# Patient Record
Sex: Female | Born: 1965 | Race: Black or African American | Hispanic: No | Marital: Single | Smoking: Never smoker
Health system: Southern US, Community
[De-identification: ages and names within clinical notes are randomized; demographics above are authoritative.]

## PROBLEM LIST (undated history)

## (undated) DIAGNOSIS — M2550 Pain in unspecified joint: Secondary | ICD-10-CM

## (undated) DIAGNOSIS — E559 Vitamin D deficiency, unspecified: Secondary | ICD-10-CM

## (undated) DIAGNOSIS — M549 Dorsalgia, unspecified: Secondary | ICD-10-CM

## (undated) DIAGNOSIS — I10 Essential (primary) hypertension: Secondary | ICD-10-CM

## (undated) DIAGNOSIS — E669 Obesity, unspecified: Secondary | ICD-10-CM

## (undated) DIAGNOSIS — C541 Malignant neoplasm of endometrium: Secondary | ICD-10-CM

## (undated) DIAGNOSIS — R600 Localized edema: Secondary | ICD-10-CM

## (undated) DIAGNOSIS — M1990 Unspecified osteoarthritis, unspecified site: Secondary | ICD-10-CM

## (undated) HISTORY — DX: Localized edema: R60.0

## (undated) HISTORY — PX: ABDOMINAL HYSTERECTOMY: SHX81

## (undated) HISTORY — DX: Pain in unspecified joint: M25.50

## (undated) HISTORY — DX: Vitamin D deficiency, unspecified: E55.9

## (undated) HISTORY — DX: Obesity, unspecified: E66.9

## (undated) HISTORY — DX: Essential (primary) hypertension: I10

## (undated) HISTORY — DX: Malignant neoplasm of endometrium: C54.1

## (undated) HISTORY — DX: Dorsalgia, unspecified: M54.9

## (undated) HISTORY — PX: REDUCTION MAMMAPLASTY: SUR839

---

## 1998-02-02 ENCOUNTER — Encounter: Admission: RE | Admit: 1998-02-02 | Discharge: 1998-05-03 | Payer: Self-pay | Admitting: *Deleted

## 2000-06-23 ENCOUNTER — Other Ambulatory Visit: Admission: RE | Admit: 2000-06-23 | Discharge: 2000-06-23 | Payer: Self-pay | Admitting: Family Medicine

## 2001-07-05 ENCOUNTER — Other Ambulatory Visit: Admission: RE | Admit: 2001-07-05 | Discharge: 2001-07-05 | Payer: Self-pay | Admitting: Family Medicine

## 2001-07-08 ENCOUNTER — Encounter: Payer: Self-pay | Admitting: Family Medicine

## 2001-07-08 ENCOUNTER — Encounter: Admission: RE | Admit: 2001-07-08 | Discharge: 2001-07-08 | Payer: Self-pay | Admitting: Family Medicine

## 2003-04-10 ENCOUNTER — Other Ambulatory Visit: Admission: RE | Admit: 2003-04-10 | Discharge: 2003-04-10 | Payer: Self-pay | Admitting: Family Medicine

## 2004-11-21 ENCOUNTER — Other Ambulatory Visit: Admission: RE | Admit: 2004-11-21 | Discharge: 2004-11-21 | Payer: Self-pay | Admitting: Family Medicine

## 2006-04-29 ENCOUNTER — Encounter: Admission: RE | Admit: 2006-04-29 | Discharge: 2006-04-29 | Payer: Self-pay | Admitting: Family Medicine

## 2006-05-03 ENCOUNTER — Encounter: Admission: RE | Admit: 2006-05-03 | Discharge: 2006-05-03 | Payer: Self-pay | Admitting: Family Medicine

## 2006-06-29 DIAGNOSIS — C541 Malignant neoplasm of endometrium: Secondary | ICD-10-CM

## 2006-06-29 HISTORY — DX: Malignant neoplasm of endometrium: C54.1

## 2006-08-03 ENCOUNTER — Ambulatory Visit (HOSPITAL_COMMUNITY): Admission: RE | Admit: 2006-08-03 | Discharge: 2006-08-03 | Payer: Self-pay | Admitting: Obstetrics and Gynecology

## 2006-08-03 ENCOUNTER — Encounter (INDEPENDENT_AMBULATORY_CARE_PROVIDER_SITE_OTHER): Payer: Self-pay | Admitting: *Deleted

## 2007-09-16 ENCOUNTER — Encounter: Admission: RE | Admit: 2007-09-16 | Discharge: 2007-09-16 | Payer: Self-pay | Admitting: Family Medicine

## 2009-04-17 HISTORY — PX: LAPAROSCOPIC GASTRIC BANDING: SHX1100

## 2010-10-20 ENCOUNTER — Encounter: Payer: Self-pay | Admitting: Family Medicine

## 2011-02-14 NOTE — Op Note (Signed)
[name], [name]               ACCOUNT NO.:  [id number]   MEDICAL RECORD NO.:  [id number]          PATIENT TYPE:  AMB   LOCATION:  SDC                           FACILITY:  [facility]   PHYSICIAN:  Dois Davenport A. Rivard, M.D. DATE OF BIRTH:  05-Mar-1966   DATE OF PROCEDURE:  08/03/2006  DATE OF DISCHARGE:                                 OPERATIVE REPORT   PREOPERATIVE DIAGNOSIS:  Metrorrhagia.   POSTOPERATIVE DIAGNOSIS:  Metrorrhagia.   ANESTHESIA:  General.   PROCEDURE:  Hysteroscopy and D&C.   SURGEON:  Crist Fat. Rivard, M.D.   ESTIMATED BLOOD LOSS:  Minimal.   PROCEDURE:  After being informed of the planned procedure with possible  complications including bleeding, infection and injury to uterus, informed  consent is obtained.  The patient is taken to OR #7 and given general  anesthesia with endotracheal intubation without complication.  She is placed  in the lithotomy position, prepped and draped in a sterile fashion and her  bladder is emptied with an in-and-out Foley catheter.  A weighted speculum  is inserted, anterior lip of the cervix was grasped with a tenaculum forceps  and we proceed with a paracervical block using Nesacaine 1% 10 mL in the  usual fashion to help in postop pain management.  The uterus is then sounded  at 12 cm which is much larger than expected.  The cervix easily accepts  Hegar dilator #25 thanks to preop vaginal Cytotec.  We proceed with  completing cervical dilatation with Hegar dilator until 31 and we enter a  operative hysteroscope.   With perfusion of sorbitol 3% at a maximum pressure of 90 mmHg.  We attempt  to visualize the endometrial cavity which was very difficult due to the  ongoing bleeding from the patient.  We then removed hysteroscope and proceed  with a sharp curettage of the endometrial cavity which removes a large  amount of endometrium that appears partially necrotic.  After a lengthy  sharp curettage, the hysteroscope is reinserted  again at a maximum pressure  of 90 mmHg and then we can visualize the fundal area of the uterus which  appears to be covered with overgrown endometrium with a papillary pattern  and abnormal vessels.  This is highly suspicious for adenocarcinoma that  could also be complex hyperplasia with atypia.  The hysteroscope was removed  and we proceed again with curettage which removes a very large quantity of  the same material previously described.  We feel for Telfa sponges of this  material and then reinsert the hysteroscope to visualize the endometrial  cavity.  Bleeding has stopped.  Most of the cavity is normal except of that  left fundal area which still contained some tissue.  There is no active  bleeding.  No polyps were identified, but again the visualization of the  cavity was greatly limited by the ongoing bleeding.  The tubal ostia were  never visualized because of this overgrown endometrium overlying both areas.  We removed the instruments, removed the tenaculum.  There is no active  bleeding on the cervix but there is a  small laceration of the perineum which  is closed with a figure-of-eight stitch of 3-0 Monocryl.  Instruments and  sponge count is complete x2.  Estimated blood loss is minimal.  The  procedure was well tolerated by the patient who is taken to recovery room in  a well and stable condition.   We will await final pathology report for future management of this patient.  The patient is discharged home with pain management with ibuprofen 600 mg  and Vicodin.      Crist Fat Rivard, M.D.  Electronically Signed     SAR/MEDQ  D:  08/03/2006  T:  08/03/2006  Job:  [id number]   cc:   Talmadge Coventry, M.D.  Fax: [phone]

## 2011-07-25 ENCOUNTER — Other Ambulatory Visit: Payer: Self-pay | Admitting: Surgical Oncology

## 2011-07-25 ENCOUNTER — Other Ambulatory Visit: Payer: Self-pay | Admitting: Family Medicine

## 2011-07-25 ENCOUNTER — Other Ambulatory Visit: Payer: Self-pay | Admitting: *Deleted

## 2011-07-25 DIAGNOSIS — Z1231 Encounter for screening mammogram for malignant neoplasm of breast: Secondary | ICD-10-CM

## 2011-07-28 ENCOUNTER — Ambulatory Visit (HOSPITAL_BASED_OUTPATIENT_CLINIC_OR_DEPARTMENT_OTHER)
Admission: RE | Admit: 2011-07-28 | Discharge: 2011-07-28 | Disposition: A | Discharge status: Home / Self Care | Payer: BC Managed Care – PPO | Source: Ambulatory Visit | Attending: Diagnostic Radiology | Admitting: Diagnostic Radiology

## 2011-07-28 ENCOUNTER — Other Ambulatory Visit: Payer: Self-pay | Admitting: Internal Medicine

## 2011-07-28 DIAGNOSIS — Z1231 Encounter for screening mammogram for malignant neoplasm of breast: Secondary | ICD-10-CM

## 2011-08-14 ENCOUNTER — Ambulatory Visit (INDEPENDENT_AMBULATORY_CARE_PROVIDER_SITE_OTHER): Payer: BC Managed Care – PPO | Admitting: Internal Medicine

## 2011-08-14 ENCOUNTER — Ambulatory Visit: Payer: BC Managed Care – PPO | Admitting: Internal Medicine

## 2011-08-14 ENCOUNTER — Encounter: Payer: Self-pay | Admitting: Internal Medicine

## 2011-08-14 DIAGNOSIS — I10 Essential (primary) hypertension: Secondary | ICD-10-CM

## 2011-08-14 DIAGNOSIS — C541 Malignant neoplasm of endometrium: Secondary | ICD-10-CM

## 2011-08-14 DIAGNOSIS — E669 Obesity, unspecified: Secondary | ICD-10-CM

## 2011-08-14 DIAGNOSIS — C549 Malignant neoplasm of corpus uteri, unspecified: Secondary | ICD-10-CM

## 2011-08-14 NOTE — Progress Notes (Signed)
  Subjective:    Patient ID: [name], female    DOB: [birth year], 45 y.o.   MRN: [id number]  HPI New pt here for first visit.  Former pt of Dr. Raquel James.  She currently receives medical care from Occ MD at [facility]  Dr. Christene Slates.  PMH  Obesity S/P lap band in 2010, HTN, and [place] CA of endometrium S/P  Hysterectomy and Bil S/O in 2008.  Overall doing well but frustrated with plateau in weight after lap band.  Total weight loss approx 50 lbs.  She reports she does not take vitamin regularly.  She does see Dr. Lily Peer q 2-3 months.  She is interested ina second opinion about weight control issues now  She assures me shehas had all recent blood work from [place] but I do not have copies.  She tells me they did check all vitamin levels there.  I asked her to bring me copies of labs as she does not want labs done here today  No Known Allergies Past Medical History  Diagnosis Date  . 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 Hypertension   . Obesity     S/P lap band 2010   Past Surgical History  Procedure Date  . Abdominal hysterectomy     s/p endometrial adenocarcinoma  . Laparoscopic gastric banding 04/17/2009   History   Social History  . Marital Status: Single    Spouse Name: N/A    Number of Children: N/A  . Years of Education: N/A   Occupational History  . Not on file.   Social History Main Topics  . Smoking status: Never Smoker   . Smokeless tobacco: Never Used  . Alcohol Use: No  . Drug Use: No  . Sexually Active: No   Other Topics Concern  . Not on file   Social History Narrative  . No narrative on file   Family History  Problem Relation Age of Onset  . Hypertension Mother   . Kidney disease Mother   . Hypertension Sister   . Hypertension Maternal Grandmother    Patient Active Problem List  Diagnoses  . Hypertension  . Endometrial adenocarcinoma  . Obesity   No current outpatient prescriptions on file prior to visit.        Review of  Systems See HPI    Objective:   Physical Exam Physical Exam  Nursing note and vitals reviewed.  Constitutional: She is oriented to person, place, and time. She appears well-developed and well-nourished.  HENT:  Head: Normocephalic and atraumatic.  Cardiovascular: Normal rate and regular rhythm. Exam reveals no gallop and no friction rub.  No murmur heard.  Pulmonary/Chest: Breath sounds normal. She has no wheezes. She has no rales.  Neurological: She is alert and oriented to person, place, and time.  Skin: Skin is warm and dry.  Psychiatric: She has a normal mood and affect. Her behavior is normal. Ext:  No edema       Assessment & Plan:  1)  Obesity S/P lap band  Advised she must take MVI daily.  She declines testing of vitamin levbels or labs today.  Will refer to Dr. Kinnie Scales for second opinion  ?? Addition of meds vs diet guidance 2)  HTN  Well controlled on triamterene/hctz 3)  AdenoCA of endometrium:  She has upcoming appt . With gyn onc at [facility] 4)   aDvised pt to give consent for old records

## 2011-08-14 NOTE — Patient Instructions (Signed)
Will set up appt with Dr. Lorelee Cover consent for old records  Return prn

## 2011-10-16 ENCOUNTER — Telehealth: Payer: Self-pay | Admitting: Internal Medicine

## 2011-10-16 NOTE — Telephone Encounter (Signed)
Gavin Pound would you call Dr. Mancel Bale office as we referred this pt. To him.  If she went would you get office note  Message back if she went or not

## 2011-10-21 ENCOUNTER — Encounter: Payer: Self-pay | Admitting: Internal Medicine

## 2011-10-21 NOTE — Telephone Encounter (Signed)
Per Dr. Jennye Boroughs office, she did not keep her appointment.

## 2011-10-24 ENCOUNTER — Encounter: Payer: Self-pay | Admitting: Internal Medicine

## 2011-10-27 ENCOUNTER — Encounter: Payer: Self-pay | Admitting: Internal Medicine

## 2012-05-05 ENCOUNTER — Encounter: Payer: Self-pay | Admitting: Internal Medicine

## 2012-05-05 DIAGNOSIS — M2550 Pain in unspecified joint: Secondary | ICD-10-CM | POA: Insufficient documentation

## 2012-07-15 ENCOUNTER — Other Ambulatory Visit: Payer: Self-pay | Admitting: Internal Medicine

## 2012-07-15 DIAGNOSIS — Z1231 Encounter for screening mammogram for malignant neoplasm of breast: Secondary | ICD-10-CM

## 2012-08-04 ENCOUNTER — Ambulatory Visit (HOSPITAL_BASED_OUTPATIENT_CLINIC_OR_DEPARTMENT_OTHER)
Admission: RE | Admit: 2012-08-04 | Discharge: 2012-08-04 | Disposition: A | Discharge status: Home / Self Care | Payer: BC Managed Care – PPO | Source: Ambulatory Visit | Attending: Internal Medicine | Admitting: Internal Medicine

## 2012-08-04 DIAGNOSIS — Z1231 Encounter for screening mammogram for malignant neoplasm of breast: Secondary | ICD-10-CM

## 2013-10-07 ENCOUNTER — Other Ambulatory Visit: Payer: Self-pay | Admitting: Internal Medicine

## 2013-10-07 DIAGNOSIS — Z1231 Encounter for screening mammogram for malignant neoplasm of breast: Secondary | ICD-10-CM

## 2013-10-17 ENCOUNTER — Ambulatory Visit (HOSPITAL_BASED_OUTPATIENT_CLINIC_OR_DEPARTMENT_OTHER)
Admission: RE | Admit: 2013-10-17 | Discharge: 2013-10-17 | Disposition: A | Discharge status: Home / Self Care | Payer: BC Managed Care – PPO | Source: Ambulatory Visit | Attending: Internal Medicine | Admitting: Internal Medicine

## 2013-10-17 DIAGNOSIS — Z1231 Encounter for screening mammogram for malignant neoplasm of breast: Secondary | ICD-10-CM

## 2013-11-01 ENCOUNTER — Encounter (HOSPITAL_COMMUNITY): Payer: Self-pay | Admitting: Emergency Medicine

## 2013-11-01 ENCOUNTER — Emergency Department (HOSPITAL_COMMUNITY)
Admission: EM | Admit: 2013-11-01 | Discharge: 2013-11-01 | Disposition: A | Discharge status: Home / Self Care | Payer: No Typology Code available for payment source | Attending: Emergency Medicine | Admitting: Emergency Medicine

## 2013-11-01 DIAGNOSIS — I10 Essential (primary) hypertension: Secondary | ICD-10-CM | POA: Insufficient documentation

## 2013-11-01 DIAGNOSIS — Z9884 Bariatric surgery status: Secondary | ICD-10-CM | POA: Insufficient documentation

## 2013-11-01 DIAGNOSIS — Z8541 Personal history of malignant neoplasm of cervix uteri: Secondary | ICD-10-CM | POA: Insufficient documentation

## 2013-11-01 DIAGNOSIS — S298XXA Other specified injuries of thorax, initial encounter: Secondary | ICD-10-CM | POA: Insufficient documentation

## 2013-11-01 DIAGNOSIS — Z79899 Other long term (current) drug therapy: Secondary | ICD-10-CM | POA: Insufficient documentation

## 2013-11-01 DIAGNOSIS — Z8669 Personal history of other diseases of the nervous system and sense organs: Secondary | ICD-10-CM | POA: Insufficient documentation

## 2013-11-01 DIAGNOSIS — Y92410 Unspecified street and highway as the place of occurrence of the external cause: Secondary | ICD-10-CM | POA: Insufficient documentation

## 2013-11-01 DIAGNOSIS — Y9389 Activity, other specified: Secondary | ICD-10-CM | POA: Insufficient documentation
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
## 2013-11-01 NOTE — ED Notes (Signed)
Pt ambulatory to exam room with steady gait.  
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
## 2015-04-25 ENCOUNTER — Other Ambulatory Visit: Payer: Self-pay | Admitting: Physician Assistant

## 2015-04-25 DIAGNOSIS — Z9884 Bariatric surgery status: Secondary | ICD-10-CM

## 2015-04-25 DIAGNOSIS — R109 Unspecified abdominal pain: Secondary | ICD-10-CM

## 2015-05-08 ENCOUNTER — Ambulatory Visit
Admission: RE | Admit: 2015-05-08 | Discharge: 2015-05-08 | Disposition: A | Discharge status: Home / Self Care | Payer: BLUE CROSS/BLUE SHIELD | Source: Ambulatory Visit | Attending: Physician Assistant | Admitting: Physician Assistant

## 2015-05-08 ENCOUNTER — Other Ambulatory Visit: Payer: Self-pay | Admitting: Physician Assistant

## 2015-05-08 DIAGNOSIS — Z9884 Bariatric surgery status: Secondary | ICD-10-CM

## 2015-05-08 DIAGNOSIS — R109 Unspecified abdominal pain: Secondary | ICD-10-CM

## 2016-02-28 DIAGNOSIS — M25562 Pain in left knee: Secondary | ICD-10-CM | POA: Diagnosis not present

## 2016-03-06 DIAGNOSIS — M170 Bilateral primary osteoarthritis of knee: Secondary | ICD-10-CM | POA: Diagnosis not present

## 2016-03-12 DIAGNOSIS — E559 Vitamin D deficiency, unspecified: Secondary | ICD-10-CM | POA: Diagnosis not present

## 2016-03-27 DIAGNOSIS — Z6841 Body Mass Index (BMI) 40.0 and over, adult: Secondary | ICD-10-CM | POA: Diagnosis not present

## 2016-03-27 DIAGNOSIS — M170 Bilateral primary osteoarthritis of knee: Secondary | ICD-10-CM | POA: Diagnosis not present

## 2016-03-27 DIAGNOSIS — I10 Essential (primary) hypertension: Secondary | ICD-10-CM | POA: Diagnosis not present

## 2016-05-27 DIAGNOSIS — I10 Essential (primary) hypertension: Secondary | ICD-10-CM | POA: Diagnosis not present

## 2016-05-27 DIAGNOSIS — M170 Bilateral primary osteoarthritis of knee: Secondary | ICD-10-CM | POA: Diagnosis not present

## 2016-05-27 DIAGNOSIS — Z6841 Body Mass Index (BMI) 40.0 and over, adult: Secondary | ICD-10-CM | POA: Diagnosis not present

## 2016-06-12 DIAGNOSIS — S83282A Other tear of lateral meniscus, current injury, left knee, initial encounter: Secondary | ICD-10-CM | POA: Diagnosis not present

## 2016-06-20 DIAGNOSIS — M25562 Pain in left knee: Secondary | ICD-10-CM | POA: Diagnosis not present

## 2016-06-26 DIAGNOSIS — M1712 Unilateral primary osteoarthritis, left knee: Secondary | ICD-10-CM | POA: Diagnosis not present

## 2016-08-08 ENCOUNTER — Encounter: Payer: Self-pay | Admitting: Orthopedic Surgery

## 2016-08-08 DIAGNOSIS — M1712 Unilateral primary osteoarthritis, left knee: Secondary | ICD-10-CM | POA: Diagnosis not present

## 2016-08-08 DIAGNOSIS — Z8542 Personal history of malignant neoplasm of other parts of uterus: Secondary | ICD-10-CM

## 2016-08-08 NOTE — H&P (Signed)
PREOPERATIVE H&P Patient ID: [name] MRN: [id number] DOB/AGE: 1966-08-26 50 y.o.  Chief Complaint: Left Knee Pain  Planned Procedure Date: 08/25/16 Medical / Cardiac Clearance: Dr. Baird Cancer (PCP) and Dr. Leola Brazil (company MD).  HPI: [name] is a 50 y.o. female who presents for evaluation primary osteoarthritis of the left knee. She does have bilateral knee pain, left much more significant than right. She has tried Visco supplementation bilaterally which helped on the right but not the left. The patient has a history of pain and functional disability in the left knee due to arthritis and has failed non-surgical conservative treatments for greater than 12 weeks to include NSAID's and/or analgesics, corticosteriod injections, viscosupplementation injections and activity modification.  Onset of symptoms was abrupt, starting 2 years ago with gradually worsening course since that time.  Patient currently rates pain at 9 out of 10 with activity. Patient has night pain, worsening of pain with activity and weight bearing and pain that interferes with activities of daily living.  Patient has evidence of periarticular osteophytes and joint space narrowing on x-ray. MRI of the left knee on 06/20/2016 showed significant tricompartmental osteoarthritis as well as a lateral meniscal tear.  There is no active infection.  Past Medical History:  Diagnosis Date  . Endometrial adenocarcinoma ([place]) 06/2006  . Hypertension   . Obesity    S/P lap band 2010   Past Surgical History:  Procedure Laterality Date  . ABDOMINAL HYSTERECTOMY  2008   s/p endometrial adenocarcinoma  . LAPAROSCOPIC GASTRIC BANDING  04/17/2009   No Known Allergies   Prior to Admission medications   Medication Sig Start Date End Date Taking? Authorizing Provider  celecoxib (CELEBREX) 200 MG capsule Take 200 mg by mouth as needed. 06/07/16  Yes Historical Provider, MD  triamterene-hydrochlorothiazide (MAXZIDE-25) 37.5-25 MG per  tablet Take 1 tablet by mouth Daily. 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  Never smoker. No alcohol use. Sleeps on the main level of a multi-story home.  Family History: Mother with HTN, CKD.  Brother with HTN, Grandparents with history of MI, HTN, DM, CVA.  ROS:  Currently denies lightheadedness, dizziness, Fever, chills, CP, SOB. No personal history of DVT, PE, MI, or CVA. No loose teeth or dentures +Weight gain dt inactivity, she wears glasses. All other systems have been reviewed and were otherwise currently negative with the exception of those mentioned in the HPI and as above.  Objective: Vitals: Ht: 5'1.5" Wt: 268 Temp: 98 BP: 129/85 Pulse: 83 O2 98 % on room air. Physical Exam: General: Alert, NAD.  Antalgic Gait  HEENT: EOMI, Good Neck Extension  Pulm: No increased work of breathing.  Clear B/L A/P w/o crackle or wheeze.  CV: RRR, No m/g/r appreciated  GI: Protuberant, soft, NT, ND Neuro: Neuro grossly intact b/l upper/lower ext.  Sensation intact distally Skin: No lesions in the area of chief complaint  MSK/Surgical Site: Left knee w/o redness or effusion.  Medial and lateral JLT. ROM 0-120.  5/5 strength in extension and flexion.  +EHL/FHL.  NVI.  Stable Lachman's and varus and valgus stress.   Imaging Review Plain radiographs demonstrate periarticular osteophytes and joint space narrowing. MRI of the left knee on 06/20/2016 showed significant tricompartmental osteoarthritis as well as a lateral meniscal tear.    Assessment: Principal Problem:   Primary osteoarthritis of left knee Active Problems:   Hypertension   Obesity   Pain in joint, multiple sites  History of endometrial cancer  Plan: Plan for Procedure(s): TOTAL KNEE ARTHROPLASTY - Left  The patient history, physical exam, clinical judgement of the provider and imaging are consistent with end stage degenerative joint disease and  total joint arthroplasty is deemed medically necessary. The treatment options including medical management, injection therapy, and arthroplasty were discussed at length. The risks and benefits of Procedure(s): TOTAL KNEE ARTHROPLASTY were presented and reviewed.  The risks of nonoperative treatment, versus surgical intervention including but not limited to continued pain, aseptic loosening, stiffness, dislocation/subluxation, infection, bleeding, nerve injury, blood clots, cardiopulmonary complications, morbidity, mortality, among others were discussed. The patient verbalizes understanding and wishes to proceed with the plan.  Patient is being admitted for inpatient treatment for surgery, pain control, PT, OT, prophylactic antibiotics, VTE prophylaxis, progressive ambulation, ADL's and discharge planning.  The patient does meet the criteria for TXA which will be used perioperatively via IV.    The patient is planning to be discharged home with home health services in care of her Sister Vito Backers.  Prudencio Burly III, PA-C 08/08/2016 3:09 PM

## 2016-08-15 ENCOUNTER — Encounter (HOSPITAL_COMMUNITY): Payer: Self-pay

## 2016-08-15 ENCOUNTER — Encounter (HOSPITAL_COMMUNITY)
Admission: RE | Admit: 2016-08-15 | Discharge: 2016-08-15 | Disposition: A | Discharge status: Home / Self Care | Payer: BLUE CROSS/BLUE SHIELD | Source: Ambulatory Visit | Attending: Orthopedic Surgery | Admitting: Orthopedic Surgery

## 2016-08-15 ENCOUNTER — Other Ambulatory Visit: Payer: Self-pay

## 2016-08-15 DIAGNOSIS — Z0183 Encounter for blood typing: Secondary | ICD-10-CM | POA: Diagnosis not present

## 2016-08-15 DIAGNOSIS — Z01812 Encounter for preprocedural laboratory examination: Secondary | ICD-10-CM | POA: Diagnosis not present

## 2016-08-15 DIAGNOSIS — M1712 Unilateral primary osteoarthritis, left knee: Secondary | ICD-10-CM | POA: Diagnosis not present

## 2016-08-15 DIAGNOSIS — Z01818 Encounter for other preprocedural examination: Secondary | ICD-10-CM | POA: Insufficient documentation

## 2016-08-15 DIAGNOSIS — R001 Bradycardia, unspecified: Secondary | ICD-10-CM | POA: Diagnosis not present

## 2016-08-15 HISTORY — DX: Unspecified osteoarthritis, unspecified site: M19.90

## 2016-08-15 LAB — BASIC METABOLIC PANEL WITH GFR
Anion gap: 8 (ref 5–15)
BUN: 12 mg/dL (ref 6–20)
CO2: 25 mmol/L (ref 22–32)
Calcium: 9.7 mg/dL (ref 8.9–10.3)
Chloride: 104 mmol/L (ref 101–111)
Creatinine, Ser: 0.86 mg/dL (ref 0.44–1.00)
GFR calc Af Amer: >60 mL/min
GFR calc non Af Amer: >60 mL/min
Glucose, Bld: 87 mg/dL (ref 65–99)
Potassium: 3.8 mmol/L (ref 3.5–5.1)
Sodium: 137 mmol/L (ref 135–145)

## 2016-08-15 LAB — TYPE AND SCREEN
ABO/RH(D): O POS
Antibody Screen: NEGATIVE

## 2016-08-15 LAB — PROTIME-INR
INR: 0.97
Prothrombin Time: 12.9 s (ref 11.4–15.2)

## 2016-08-15 LAB — URINALYSIS, ROUTINE W REFLEX MICROSCOPIC
Bilirubin Urine: NEGATIVE
Glucose, UA: NEGATIVE mg/dL
Hgb urine dipstick: NEGATIVE
Ketones, ur: NEGATIVE mg/dL
Nitrite: NEGATIVE
Protein, ur: NEGATIVE mg/dL
Specific Gravity, Urine: 1.013 (ref 1.005–1.030)
pH: 7.5 (ref 5.0–8.0)

## 2016-08-15 LAB — CBC
HCT: 41.8 % (ref 36.0–46.0)
Hemoglobin: 13.7 g/dL (ref 12.0–15.0)
MCH: 26.6 pg (ref 26.0–34.0)
MCHC: 32.8 g/dL (ref 30.0–36.0)
MCV: 81 fL (ref 78.0–100.0)
PLATELETS: 281 10*3/uL (ref 150–400)
RBC: 5.16 MIL/uL — ABNORMAL HIGH (ref 3.87–5.11)
RDW: 13.7 % (ref 11.5–15.5)
WBC: 7.1 10*3/uL (ref 4.0–10.5)

## 2016-08-15 LAB — URINE MICROSCOPIC-ADD ON: RBC / HPF: NONE SEEN RBC/hpf (ref 0–5)

## 2016-08-15 LAB — SURGICAL PCR SCREEN
MRSA, PCR: NEGATIVE
STAPHYLOCOCCUS AUREUS: NEGATIVE

## 2016-08-15 LAB — APTT: aPTT: 35 s (ref 24–36)

## 2016-08-15 LAB — ABO/RH: ABO/RH(D): O POS

## 2016-08-15 NOTE — Pre-Procedure Instructions (Addendum)
    [name]  08/15/2016      [id number] [address] [place] [address] Phone: [phone] Fax: [phone]  [id number] [place], [place] - [address] [place] [address] Phone: [phone] [phone]    Your procedure is scheduled on Mon. Nov. 27  Report to [facility] Admitting at 10:15 A.M.  Call this number if you have problems the morning of surgery:  [phone]   Remember:  Do not eat food or drink liquids after midnight on Sun. Nov. 26  Take these medicines the morning of surgery with A SIP OF WATER : none             1 week prior to surgery stop: advil, motrin, aleve, ibuprofen, fish oil, vitamins and herbal medicines.       Do not wear jewelry, make-up or nail polish.  Do not wear lotions, powders, or perfumes, or deoderant.  Do not shave 48 hours prior to surgery.  Men may shave face and neck.  Do not bring valuables to the hospital.  [facility] is not responsible for any belongings or valuables.  Contacts, dentures or bridgework may not be worn into surgery.  Leave your suitcase in the car.  After surgery it may be brought to your room.  For patients admitted to the hospital, discharge time will be determined by your treatment team.  Patients discharged the day of surgery will not be allowed to drive home.   Name and phone number of your driver:    Special instructions:  Review preparing for surgery  Please read over the following fact sheets that you were given. Coughing and Deep Breathing and MRSA Information

## 2016-08-15 NOTE — Progress Notes (Signed)
PCP: Dr. Aron Baba @ [facility] in [place] . Pt. slao sees Dr. Bryon Lions in [place]

## 2016-08-16 LAB — URINE CULTURE: Culture: NO GROWTH

## 2016-08-16 LAB — HEMOGLOBIN A1C
Hgb A1c MFr Bld: 5.7 % — ABNORMAL HIGH (ref 4.8–5.6)
MEAN PLASMA GLUCOSE: 117 mg/dL

## 2016-08-19 DIAGNOSIS — Z01419 Encounter for gynecological examination (general) (routine) without abnormal findings: Secondary | ICD-10-CM | POA: Diagnosis not present

## 2016-08-19 DIAGNOSIS — Z6841 Body Mass Index (BMI) 40.0 and over, adult: Secondary | ICD-10-CM | POA: Diagnosis not present

## 2016-08-19 DIAGNOSIS — Z1231 Encounter for screening mammogram for malignant neoplasm of breast: Secondary | ICD-10-CM | POA: Diagnosis not present

## 2016-08-19 DIAGNOSIS — Z8542 Personal history of malignant neoplasm of other parts of uterus: Secondary | ICD-10-CM | POA: Diagnosis not present

## 2016-08-19 DIAGNOSIS — Z124 Encounter for screening for malignant neoplasm of cervix: Secondary | ICD-10-CM | POA: Diagnosis not present

## 2016-08-20 DIAGNOSIS — M170 Bilateral primary osteoarthritis of knee: Secondary | ICD-10-CM | POA: Diagnosis not present

## 2016-08-20 DIAGNOSIS — Z6841 Body Mass Index (BMI) 40.0 and over, adult: Secondary | ICD-10-CM | POA: Diagnosis not present

## 2016-08-20 DIAGNOSIS — I10 Essential (primary) hypertension: Secondary | ICD-10-CM | POA: Diagnosis not present

## 2016-08-22 MED ORDER — CEFAZOLIN SODIUM 10 G IJ SOLR
3.0000 g | INTRAMUSCULAR | Status: AC
  Administered 2016-08-25: 3 g via INTRAVENOUS
  Filled 2016-08-22: qty 3000

## 2016-08-22 MED ORDER — TRANEXAMIC ACID 1000 MG/10ML IV SOLN
1000.0000 mg | INTRAVENOUS | Status: AC
  Administered 2016-08-25: 1000 mg via INTRAVENOUS
  Filled 2016-08-22: qty 10

## 2016-08-25 ENCOUNTER — Inpatient Hospital Stay (HOSPITAL_COMMUNITY): Payer: BLUE CROSS/BLUE SHIELD | Admitting: Certified Registered"

## 2016-08-25 ENCOUNTER — Encounter (HOSPITAL_COMMUNITY): Payer: Self-pay | Admitting: Certified Registered"

## 2016-08-25 ENCOUNTER — Encounter (HOSPITAL_COMMUNITY)
Admission: RE | Disposition: A | Discharge status: Home / Self Care | Payer: Self-pay | Source: Ambulatory Visit | Attending: Orthopedic Surgery

## 2016-08-25 ENCOUNTER — Inpatient Hospital Stay (HOSPITAL_COMMUNITY)
Admission: RE | Admit: 2016-08-25 | Discharge: 2016-08-27 | DRG: 470 | Disposition: A | Discharge status: Home / Self Care | Payer: BLUE CROSS/BLUE SHIELD | Source: Ambulatory Visit | Attending: Orthopedic Surgery | Admitting: Orthopedic Surgery

## 2016-08-25 DIAGNOSIS — Z6841 Body Mass Index (BMI) 40.0 and over, adult: Secondary | ICD-10-CM | POA: Diagnosis not present

## 2016-08-25 DIAGNOSIS — G8918 Other acute postprocedural pain: Secondary | ICD-10-CM | POA: Diagnosis not present

## 2016-08-25 DIAGNOSIS — M25562 Pain in left knee: Secondary | ICD-10-CM | POA: Diagnosis not present

## 2016-08-25 DIAGNOSIS — Z8542 Personal history of malignant neoplasm of other parts of uterus: Secondary | ICD-10-CM

## 2016-08-25 DIAGNOSIS — E669 Obesity, unspecified: Secondary | ICD-10-CM | POA: Diagnosis present

## 2016-08-25 DIAGNOSIS — M1712 Unilateral primary osteoarthritis, left knee: Principal | ICD-10-CM | POA: Diagnosis present

## 2016-08-25 DIAGNOSIS — M2550 Pain in unspecified joint: Secondary | ICD-10-CM | POA: Diagnosis present

## 2016-08-25 DIAGNOSIS — I10 Essential (primary) hypertension: Secondary | ICD-10-CM | POA: Diagnosis not present

## 2016-08-25 DIAGNOSIS — Z96652 Presence of left artificial knee joint: Secondary | ICD-10-CM | POA: Diagnosis not present

## 2016-08-25 HISTORY — PX: TOTAL KNEE ARTHROPLASTY: SHX125

## 2016-08-25 SURGERY — ARTHROPLASTY, KNEE, TOTAL
Anesthesia: Spinal | Laterality: Left

## 2016-08-25 MED ORDER — PHENYLEPHRINE 40 MCG/ML (10ML) SYRINGE FOR IV PUSH (FOR BLOOD PRESSURE SUPPORT)
PREFILLED_SYRINGE | INTRAVENOUS | Status: AC
  Filled 2016-08-25: qty 10

## 2016-08-25 MED ORDER — MEPERIDINE HCL 25 MG/ML IJ SOLN: 6.2500 mg | INTRAMUSCULAR | Status: DC | PRN

## 2016-08-25 MED ORDER — OXYCODONE HCL 5 MG PO TABS
5.0000 mg | ORAL_TABLET | ORAL | Status: DC | PRN
  Administered 2016-08-25 – 2016-08-27 (×6): 10 mg via ORAL
  Filled 2016-08-25 (×6): qty 2

## 2016-08-25 MED ORDER — POLYETHYLENE GLYCOL 3350 17 G PO PACK
17.0000 g | PACK | Freq: Two times a day (BID) | ORAL | Status: DC
  Administered 2016-08-25 – 2016-08-27 (×4): 17 g via ORAL
  Filled 2016-08-25 (×4): qty 1

## 2016-08-25 MED ORDER — ACETAMINOPHEN 325 MG PO TABS: 650.0000 mg | ORAL_TABLET | Freq: Four times a day (QID) | ORAL | Status: DC | PRN

## 2016-08-25 MED ORDER — DEXAMETHASONE SODIUM PHOSPHATE 10 MG/ML IJ SOLN
10.0000 mg | Freq: Three times a day (TID) | INTRAMUSCULAR | Status: AC
  Administered 2016-08-25 – 2016-08-26 (×4): 10 mg via INTRAVENOUS
  Filled 2016-08-25 (×4): qty 1

## 2016-08-25 MED ORDER — ONDANSETRON HCL 4 MG/2ML IJ SOLN: 4.0000 mg | Freq: Four times a day (QID) | INTRAMUSCULAR | Status: DC | PRN

## 2016-08-25 MED ORDER — LACTATED RINGERS IV SOLN
INTRAVENOUS | Status: DC | PRN
  Administered 2016-08-25 (×2): via INTRAVENOUS

## 2016-08-25 MED ORDER — ACETAMINOPHEN 650 MG RE SUPP: 650.0000 mg | Freq: Four times a day (QID) | RECTAL | Status: DC | PRN

## 2016-08-25 MED ORDER — ONDANSETRON HCL 4 MG PO TABS: 4.0000 mg | ORAL_TABLET | Freq: Four times a day (QID) | ORAL | Status: DC | PRN

## 2016-08-25 MED ORDER — HYDROMORPHONE HCL 1 MG/ML IJ SOLN
INTRAMUSCULAR | Status: AC
  Filled 2016-08-25: qty 1

## 2016-08-25 MED ORDER — ONDANSETRON HCL 4 MG/2ML IJ SOLN: 4.0000 mg | Freq: Once | INTRAMUSCULAR | Status: DC | PRN

## 2016-08-25 MED ORDER — SODIUM CHLORIDE 0.9 % IR SOLN
Status: DC | PRN
  Administered 2016-08-25: 3000 mL
  Administered 2016-08-25: 1000 mL

## 2016-08-25 MED ORDER — PROPOFOL 10 MG/ML IV BOLUS
INTRAVENOUS | Status: DC | PRN
  Administered 2016-08-25: 150 mg via INTRAVENOUS
  Administered 2016-08-25: 20 mg via INTRAVENOUS

## 2016-08-25 MED ORDER — ROPIVACAINE HCL 7.5 MG/ML IJ SOLN
INTRAMUSCULAR | Status: DC | PRN
  Administered 2016-08-25: 20 mL via PERINEURAL

## 2016-08-25 MED ORDER — GABAPENTIN 300 MG PO CAPS
ORAL_CAPSULE | ORAL | Status: AC
  Administered 2016-08-25: 300 mg via ORAL
  Filled 2016-08-25: qty 1

## 2016-08-25 MED ORDER — DOCUSATE SODIUM 100 MG PO CAPS
100.0000 mg | ORAL_CAPSULE | Freq: Two times a day (BID) | ORAL | Status: DC
  Administered 2016-08-25 – 2016-08-27 (×4): 100 mg via ORAL
  Filled 2016-08-25 (×5): qty 1

## 2016-08-25 MED ORDER — ACETAMINOPHEN 500 MG PO TABS
1000.0000 mg | ORAL_TABLET | Freq: Once | ORAL | Status: AC
  Administered 2016-08-25: 1000 mg via ORAL

## 2016-08-25 MED ORDER — MIDAZOLAM HCL 2 MG/2ML IJ SOLN
INTRAMUSCULAR | Status: AC
  Administered 2016-08-25: 2 mg via INTRAVENOUS
  Filled 2016-08-25: qty 2

## 2016-08-25 MED ORDER — METOCLOPRAMIDE HCL 5 MG PO TABS
5.0000 mg | ORAL_TABLET | Freq: Three times a day (TID) | ORAL | Status: DC | PRN
Start: 2016-08-25 — End: 2016-08-27

## 2016-08-25 MED ORDER — FENTANYL CITRATE (PF) 100 MCG/2ML IJ SOLN
INTRAMUSCULAR | Status: AC
  Filled 2016-08-25: qty 2

## 2016-08-25 MED ORDER — CHLORHEXIDINE GLUCONATE 4 % EX LIQD: 60.0000 mL | Freq: Once | CUTANEOUS | Status: DC

## 2016-08-25 MED ORDER — PROPOFOL 500 MG/50ML IV EMUL
INTRAVENOUS | Status: DC | PRN
  Administered 2016-08-25: 10 ug/kg/min via INTRAVENOUS

## 2016-08-25 MED ORDER — HYDROMORPHONE HCL 2 MG/ML IJ SOLN
1.0000 mg | INTRAMUSCULAR | Status: DC | PRN
  Administered 2016-08-26: 1 mg via INTRAVENOUS
  Filled 2016-08-25: qty 1

## 2016-08-25 MED ORDER — DEXAMETHASONE SODIUM PHOSPHATE 10 MG/ML IJ SOLN
INTRAMUSCULAR | Status: AC
  Filled 2016-08-25: qty 1

## 2016-08-25 MED ORDER — MENTHOL 3 MG MT LOZG: 1.0000 | LOZENGE | OROMUCOSAL | Status: DC | PRN

## 2016-08-25 MED ORDER — RIVAROXABAN 10 MG PO TABS
10.0000 mg | ORAL_TABLET | Freq: Every day | ORAL | Status: DC
  Administered 2016-08-26 – 2016-08-27 (×2): 10 mg via ORAL
  Filled 2016-08-25 (×2): qty 1

## 2016-08-25 MED ORDER — DIPHENHYDRAMINE HCL 12.5 MG/5ML PO ELIX: 12.5000 mg | ORAL_SOLUTION | ORAL | Status: DC | PRN

## 2016-08-25 MED ORDER — ACETAMINOPHEN 500 MG PO TABS
ORAL_TABLET | ORAL | Status: AC
  Administered 2016-08-25: 1000 mg via ORAL
  Filled 2016-08-25: qty 2

## 2016-08-25 MED ORDER — LACTATED RINGERS IV SOLN
INTRAVENOUS | Status: DC
  Administered 2016-08-25: 10:00:00 via INTRAVENOUS

## 2016-08-25 MED ORDER — DEXAMETHASONE SODIUM PHOSPHATE 10 MG/ML IJ SOLN
INTRAMUSCULAR | Status: DC | PRN
  Administered 2016-08-25: 10 mg via INTRAVENOUS

## 2016-08-25 MED ORDER — GABAPENTIN 300 MG PO CAPS
300.0000 mg | ORAL_CAPSULE | Freq: Once | ORAL | Status: AC
  Administered 2016-08-25: 300 mg via ORAL

## 2016-08-25 MED ORDER — CEFAZOLIN SODIUM-DEXTROSE 2-4 GM/100ML-% IV SOLN
2.0000 g | Freq: Four times a day (QID) | INTRAVENOUS | Status: AC
  Administered 2016-08-25 – 2016-08-26 (×2): 2 g via INTRAVENOUS
  Filled 2016-08-25 (×2): qty 100

## 2016-08-25 MED ORDER — METOCLOPRAMIDE HCL 5 MG/ML IJ SOLN
5.0000 mg | Freq: Three times a day (TID) | INTRAMUSCULAR | Status: DC | PRN
Start: 2016-08-25 — End: 2016-08-27

## 2016-08-25 MED ORDER — MIDAZOLAM HCL 2 MG/2ML IJ SOLN
2.0000 mg | Freq: Once | INTRAMUSCULAR | Status: AC
  Administered 2016-08-25: 2 mg via INTRAVENOUS

## 2016-08-25 MED ORDER — ALUM & MAG HYDROXIDE-SIMETH 200-200-20 MG/5ML PO SUSP
30.0000 mL | ORAL | Status: DC | PRN
Start: 2016-08-25 — End: 2016-08-27

## 2016-08-25 MED ORDER — ONDANSETRON HCL 4 MG/2ML IJ SOLN
INTRAMUSCULAR | Status: AC
  Filled 2016-08-25: qty 2

## 2016-08-25 MED ORDER — FENTANYL CITRATE (PF) 100 MCG/2ML IJ SOLN
INTRAMUSCULAR | Status: AC
  Administered 2016-08-25: 100 ug via INTRAVENOUS
  Filled 2016-08-25: qty 2

## 2016-08-25 MED ORDER — PHENOL 1.4 % MT LIQD: 1.0000 | OROMUCOSAL | Status: DC | PRN

## 2016-08-25 MED ORDER — POTASSIUM CHLORIDE IN NACL 20-0.9 MEQ/L-% IV SOLN
INTRAVENOUS | Status: DC
  Administered 2016-08-25: 18:00:00 via INTRAVENOUS
  Filled 2016-08-25: qty 1000

## 2016-08-25 MED ORDER — HYDROMORPHONE HCL 1 MG/ML IJ SOLN
0.2500 mg | INTRAMUSCULAR | Status: DC | PRN
  Administered 2016-08-25 (×4): 0.5 mg via INTRAVENOUS

## 2016-08-25 MED ORDER — BUPIVACAINE HCL (PF) 0.25 % IJ SOLN
INTRAMUSCULAR | Status: AC
  Filled 2016-08-25: qty 30

## 2016-08-25 MED ORDER — FENTANYL CITRATE (PF) 100 MCG/2ML IJ SOLN
INTRAMUSCULAR | Status: DC | PRN
  Administered 2016-08-25: 50 ug via INTRAVENOUS
  Administered 2016-08-25 (×2): 25 ug via INTRAVENOUS
  Administered 2016-08-25 (×2): 50 ug via INTRAVENOUS

## 2016-08-25 MED ORDER — FENTANYL CITRATE (PF) 100 MCG/2ML IJ SOLN
100.0000 ug | Freq: Once | INTRAMUSCULAR | Status: AC
  Administered 2016-08-25: 100 ug via INTRAVENOUS

## 2016-08-25 MED ORDER — ACETAMINOPHEN 500 MG PO TABS
1000.0000 mg | ORAL_TABLET | Freq: Four times a day (QID) | ORAL | Status: AC
  Administered 2016-08-25 – 2016-08-26 (×4): 1000 mg via ORAL
  Filled 2016-08-25 (×4): qty 2

## 2016-08-25 MED ORDER — BUPIVACAINE-EPINEPHRINE 0.5% -1:200000 IJ SOLN
INTRAMUSCULAR | Status: DC | PRN
  Administered 2016-08-25: 30 mL

## 2016-08-25 MED ORDER — ONDANSETRON HCL 4 MG/2ML IJ SOLN
INTRAMUSCULAR | Status: DC | PRN
  Administered 2016-08-25: 4 mg via INTRAVENOUS

## 2016-08-25 MED ORDER — EPINEPHRINE PF 1 MG/ML IJ SOLN
INTRAMUSCULAR | Status: AC
  Filled 2016-08-25: qty 1

## 2016-08-25 SURGICAL SUPPLY — 68 items
BANDAGE ELASTIC 6 VELCRO ST LF (GAUZE/BANDAGES/DRESSINGS) ×2 IMPLANT
BANDAGE ESMARK 6X9 LF (GAUZE/BANDAGES/DRESSINGS) ×1 IMPLANT
BENZOIN TINCTURE PRP APPL 2/3 (GAUZE/BANDAGES/DRESSINGS) ×2 IMPLANT
BLADE SAGITTAL 25.0X1.19X90 (BLADE) ×2 IMPLANT
BLADE SAW SGTL 13X75X1.27 (BLADE) ×2 IMPLANT
BLADE SURG 10 STRL SS (BLADE) ×4 IMPLANT
BNDG ELASTIC 6X15 VLCR STRL LF (GAUZE/BANDAGES/DRESSINGS) ×2 IMPLANT
BNDG ESMARK 6X9 LF (GAUZE/BANDAGES/DRESSINGS) ×2
BOWL SMART MIX CTS (DISPOSABLE) ×2 IMPLANT
CAPT KNEE TOTAL 3 ATTUNE ×2 IMPLANT
CEMENT HV SMART SET (Cement) ×4 IMPLANT
COVER SURGICAL LIGHT HANDLE (MISCELLANEOUS) ×2 IMPLANT
CUFF TOURNIQUET SINGLE 34IN LL (TOURNIQUET CUFF) ×2 IMPLANT
CUFF TOURNIQUET SINGLE 44IN (TOURNIQUET CUFF) IMPLANT
DECANTER SPIKE VIAL GLASS SM (MISCELLANEOUS) ×2 IMPLANT
DRAPE EXTREMITY T 121X128X90 (DRAPE) ×2 IMPLANT
DRAPE HALF SHEET 40X57 (DRAPES) ×2 IMPLANT
DRAPE INCISE IOBAN 66X45 STRL (DRAPES) ×2 IMPLANT
DRAPE PROXIMA HALF (DRAPES) ×2 IMPLANT
DRAPE U-SHAPE 47X51 STRL (DRAPES) ×2 IMPLANT
DRSG AQUACEL AG ADV 3.5X14 (GAUZE/BANDAGES/DRESSINGS) ×2 IMPLANT
DURAPREP 26ML APPLICATOR (WOUND CARE) ×4 IMPLANT
ELECT CAUTERY BLADE 6.4 (BLADE) ×2 IMPLANT
ELECT REM PT RETURN 9FT ADLT (ELECTROSURGICAL) ×2
ELECTRODE REM PT RTRN 9FT ADLT (ELECTROSURGICAL) ×1 IMPLANT
FACESHIELD WRAPAROUND (MASK) ×2 IMPLANT
GLOVE BIO SURGEON STRL SZ7 (GLOVE) ×2 IMPLANT
GLOVE BIOGEL PI IND STRL 7.0 (GLOVE) ×1 IMPLANT
GLOVE BIOGEL PI IND STRL 7.5 (GLOVE) ×1 IMPLANT
GLOVE BIOGEL PI INDICATOR 7.0 (GLOVE) ×1
GLOVE BIOGEL PI INDICATOR 7.5 (GLOVE) ×1
GLOVE SS BIOGEL STRL SZ 7.5 (GLOVE) ×1 IMPLANT
GLOVE SUPERSENSE BIOGEL SZ 7.5 (GLOVE) ×1
GOWN STRL REUS W/ TWL LRG LVL3 (GOWN DISPOSABLE) ×1 IMPLANT
GOWN STRL REUS W/ TWL XL LVL3 (GOWN DISPOSABLE) ×2 IMPLANT
GOWN STRL REUS W/TWL LRG LVL3 (GOWN DISPOSABLE) ×1
GOWN STRL REUS W/TWL XL LVL3 (GOWN DISPOSABLE) ×2
HANDPIECE INTERPULSE COAX TIP (DISPOSABLE) ×1
HOOD PEEL AWAY FACE SHEILD DIS (HOOD) ×4 IMPLANT
IMMOBILIZER KNEE 22 UNIV (SOFTGOODS) ×2 IMPLANT
KIT BASIN OR (CUSTOM PROCEDURE TRAY) ×2 IMPLANT
KIT ROOM TURNOVER OR (KITS) ×2 IMPLANT
MANIFOLD NEPTUNE II (INSTRUMENTS) ×2 IMPLANT
MARKER SKIN DUAL TIP RULER LAB (MISCELLANEOUS) ×2 IMPLANT
NEEDLE 18GX1X1/2 (RX/OR ONLY) (NEEDLE) ×2 IMPLANT
NS IRRIG 1000ML POUR BTL (IV SOLUTION) ×2 IMPLANT
PACK TOTAL JOINT (CUSTOM PROCEDURE TRAY) ×2 IMPLANT
PAD ARMBOARD 7.5X6 YLW CONV (MISCELLANEOUS) ×4 IMPLANT
SET HNDPC FAN SPRY TIP SCT (DISPOSABLE) ×1 IMPLANT
STRIP CLOSURE SKIN 1/2X4 (GAUZE/BANDAGES/DRESSINGS) ×2 IMPLANT
SUCTION FRAZIER HANDLE 10FR (MISCELLANEOUS) ×1
SUCTION TUBE FRAZIER 10FR DISP (MISCELLANEOUS) ×1 IMPLANT
SUT MNCRL AB 3-0 PS2 18 (SUTURE) ×2 IMPLANT
SUT VIC AB 0 CT1 27 (SUTURE) ×2
SUT VIC AB 0 CT1 27XBRD ANBCTR (SUTURE) ×2 IMPLANT
SUT VIC AB 1 CT1 27 (SUTURE) ×1
SUT VIC AB 1 CT1 27XBRD ANBCTR (SUTURE) ×1 IMPLANT
SUT VIC AB 2-0 CT1 27 (SUTURE) ×2
SUT VIC AB 2-0 CT1 TAPERPNT 27 (SUTURE) ×2 IMPLANT
SYR 30ML LL (SYRINGE) ×2 IMPLANT
TOWEL OR 17X24 6PK STRL BLUE (TOWEL DISPOSABLE) ×2 IMPLANT
TOWEL OR 17X26 10 PK STRL BLUE (TOWEL DISPOSABLE) ×2 IMPLANT
TRAY CATH 16FR W/PLASTIC CATH (SET/KITS/TRAYS/PACK) IMPLANT
TRAY FOLEY CATH 16FR SILVER (SET/KITS/TRAYS/PACK) ×2 IMPLANT
TRAY FOLEY METER SIL LF 16FR (CATHETERS) ×2 IMPLANT
TUBE CONNECTING 12X1/4 (SUCTIONS) ×2 IMPLANT
TUBE CONNECTING 20X1/4 (TUBING) ×2 IMPLANT
YANKAUER SUCT BULB TIP NO VENT (SUCTIONS) ×4 IMPLANT

## 2016-08-25 NOTE — Progress Notes (Signed)
Orthopedic Tech Progress Note Patient Details:  EILIYAH STACKS [birth year] [id number]  CPM Left Knee CPM Left Knee: On Left Knee Flexion (Degrees): 90 Left Knee Extension (Degrees): 0 Additional Comments: Provided zero degree bone foam, applied CPM 0-90, and Applied Overhead frame with Trapeze   Kristopher Oppenheim 08/25/2016, 2:46 PM

## 2016-08-25 NOTE — Anesthesia Procedure Notes (Signed)
Procedure Name: LMA Insertion Date/Time: 08/25/2016 12:01 PM Performed by: Manuela Schwartz B Pre-anesthesia Checklist: Patient identified, Emergency Drugs available, Suction available and Patient being monitored Patient Re-evaluated:Patient Re-evaluated prior to inductionOxygen Delivery Method: Circle System Utilized Preoxygenation: Pre-oxygenation with 100% oxygen Intubation Type: IV induction LMA: LMA inserted LMA Size: 4.0 Number of attempts: 1 Placement Confirmation: positive ETCO2 Tube secured with: Tape Dental Injury: Teeth and Oropharynx as per pre-operative assessment

## 2016-08-25 NOTE — Anesthesia Procedure Notes (Addendum)
Anesthesia Regional Block:  Adductor canal block  Pre-Anesthetic Checklist: ,, timeout performed, Correct Patient, Correct Site, Correct Laterality, Correct Procedure, Correct Position, site marked, Risks and benefits discussed,  Surgical consent,  Pre-op evaluation,  At surgeon's request and post-op pain management  Laterality: Left  Prep: chloraprep       Needles:  Injection technique: Single-shot  Needle Type: Echogenic Stimulator Needle     Needle Length: 9cm 9 cm Needle Gauge: 21 and 21 G    Additional Needles:  Procedures: ultrasound guided (picture in chart) Adductor canal block Narrative:  Start time: 08/25/2016 10:50 AM End time: 08/25/2016 11:00 AM Injection made incrementally with aspirations every 5 mL.  Performed by: Personally  Anesthesiologist: Lillia Abed  Additional Notes: Monitors applied. Patient sedated. Sterile prep and drape,hand hygiene and sterile gloves were used. Relevant anatomy identified.Needle position confirmed.Local anesthetic injected incrementally after negative aspiration. Local anesthetic spread visualized around nerve(s). Vascular puncture avoided. No complications. Image printed for medical record.The patient tolerated the procedure well.    Lillia Abed MD

## 2016-08-25 NOTE — Op Note (Signed)
MRN:     [id number] DOB/AGE:    02-18-1966 / 50 y.o.       OPERATIVE REPORT    DATE OF PROCEDURE:  08/25/2016       PREOPERATIVE DIAGNOSIS:   Primary localized OA left knee      Estimated body mass index is 48.47 kg/m as calculated from the following:   Height as of this encounter: 5\' 2"  (1.575 m).   Weight as of this encounter: 120.2 kg (265 lb).                                                        POSTOPERATIVE DIAGNOSIS:   same                                                                    PROCEDURE:  Procedure(s): TOTAL KNEE ARTHROPLASTY Using Depuy Attune RP implants #4 Femur, #4Tibia, 27mm  RP bearing, 29 Patella     SURGEON: Taniesha Glanz A    ASSISTANT:  Kirstin Shepperson PA-C   (Present and scrubbed throughout the case, critical for assistance with exposure, retraction, instrumentation, and closure.)         ANESTHESIA: GET with Adductor Nerve Block     TOURNIQUET TIME: [id number]   COMPLICATIONS:  None     SPECIMENS: None   INDICATIONS FOR PROCEDURE: The patient has  djd left knee, varus deformities, XR shows bone on bone arthritis. Patient has failed all conservative measures including anti-inflammatory medicines, narcotics, attempts at  exercise and weight loss, cortisone injections and viscosupplementation.  Risks and benefits of surgery have been discussed, questions answered.   DESCRIPTION OF PROCEDURE: The patient identified by armband, received  right femoral nerve block and IV antibiotics, in the holding area at [facility]. Patient taken to the operating room, appropriate anesthetic  monitors were attached General endotracheal anesthesia induced with  the patient in supine position, Foley catheter was inserted. Tourniquet  applied high to the operative thigh. Lateral post and foot positioner  applied to the table, the lower extremity was then prepped and draped  in usual sterile fashion from the ankle to the tourniquet. Time-out procedure was  performed. The limb was wrapped with an Esmarch bandage and the tourniquet inflated to 365 mmHg. We began the operation by making the anterior midline incision starting at handbreadth above the patella going over the patella 1 cm medial to and  4 cm distal to the tibial tubercle. Small bleeders in the skin and the  subcutaneous tissue identified and cauterized. Transverse retinaculum was incised and reflected medially and a medial parapatellar arthrotomy was accomplished. the patella was everted and theprepatellar fat pad resected. The superficial medial collateral  ligament was then elevated from anterior to posterior along the proximal  flare of the tibia and anterior half of the menisci resected. The knee was hyperflexed exposing bone on bone arthritis. Peripheral and notch osteophytes as well as the cruciate ligaments were then resected. We continued to  work our way around posteriorly along the proximal tibia, and externally  rotated the tibia subluxing it out from  underneath the femur. A McHale  retractor was placed through the notch and a lateral Hohmann retractor  placed, and we then drilled through the proximal tibia in line with the  axis of the tibia followed by an intramedullary guide rod and 2-degree  posterior slope cutting guide. The tibial cutting guide was pinned into place  allowing resection of 6 mm of bone medially and about 4 mm of bone  laterally because of her valgus deformity. Satisfied with the tibial resection, we then  entered the distal femur 2 mm anterior to the PCL origin with the  intramedullary guide rod and applied the distal femoral cutting guide  set at 24mm, with 5 degrees of valgus. This was pinned along the  epicondylar axis. At this point, the distal femoral cut was accomplished without difficulty. We then sized for a #4 femoral component and pinned the guide in 3 degrees of external rotation.The chamfer cutting guide was pinned into place. The anterior,  posterior, and chamfer cuts were accomplished without difficulty followed by  the  RP box cutting guide and the box cut. We also removed posterior osteophytes from the posterior femoral condyles. At this  time, the knee was brought into full extension. We checked our  extension and flexion gaps and found them symmetric at 58mm.  The patella thickness measured at 25 mm. We set the cutting guide at 15 and removed the posterior 9.5-10 mm  of the patella sized for 29 button and drilled the lollipop. The knee  was then once again hyperflexed exposing the proximal tibia. We sized for a #4 tibial base plate, applied the smokestack and the conical reamer followed by the the Delta fin keel punch. We then hammered into place the  RP trial femoral component, inserted a 1 trial bearing, trial patellar button, and took the knee through range of motion from 0-130 degrees. No thumb pressure was required for patellar  tracking. At this point, all trial components were removed, a double batch of DePuy HV cement  was mixed and applied to all bony metallic mating surfaces except for the posterior condyles of the femur itself. In order, we  hammered into place the tibial tray and removed excess cement, the femoral component and removed excess cement, a 73mm  RP bearing  was inserted, and the knee brought to full extension with compression.  The patellar button was clamped into place, and excess cement  removed. While the cement cured the wound was irrigated out with normal saline solution pulse lavage.. Ligament stability and patellar tracking were checked and found to be excellent.. The parapatellar arthrotomy was closed with  #1 Vicryl suture. The subcutaneous tissue with 0 and 2-0 undyed  Vicryl suture, and 4-0 Monocryl.. A dressing of Aquaseal,  4 x 4, dressing sponges, Webril, and Ace wrap applied. Needle and sponge count were correct times 2.The patient awakened, extubated, and taken to recovery room without  difficulty. Vascular status was normal, pulses 2+ and symmetric.   Carel Schnee A 08/25/2016, 1:32 PM

## 2016-08-25 NOTE — Transfer of Care (Signed)
Immediate Anesthesia Transfer of Care Note  Patient: [name]  Procedure(s) Performed: Procedure(s): TOTAL KNEE ARTHROPLASTY (Left)  Patient Location: PACU  Anesthesia Type:General  Level of Consciousness: awake and alert   Airway & Oxygen Therapy: Patient Spontanous Breathing and Patient connected to nasal cannula oxygen  Post-op Assessment: Report given to RN and Post -op Vital signs reviewed and stable  Post vital signs: Reviewed and stable  Last Vitals:  Vitals:   08/25/16 1112 08/25/16 1113  BP:  110/60  Pulse: (!) 57 65  Resp: 12 17  Temp:      Last Pain:  Vitals:   08/25/16 0900  TempSrc: Oral         Complications: No apparent anesthesia complications

## 2016-08-25 NOTE — Interval H&P Note (Signed)
History and Physical Interval Note:  08/25/2016 8:54 AM  [name]  has presented today for surgery, with the diagnosis of Primary localized OA left knee  The various methods of treatment have been discussed with the patient and family. After consideration of risks, benefits and other options for treatment, the patient has consented to  Procedure(s): TOTAL KNEE ARTHROPLASTY (Left) as a surgical intervention .  The patient's history has been reviewed, patient examined, no change in status, stable for surgery.  I have reviewed the patient's chart and labs.  Questions were answered to the patient's satisfaction.     Elsie Saas A

## 2016-08-25 NOTE — Anesthesia Preprocedure Evaluation (Signed)
Anesthesia Evaluation  Patient identified by MRN, date of birth, ID band Patient awake    Reviewed: Allergy & Precautions, NPO status , Patient's Chart, lab work & pertinent test results  Airway Mallampati: I  TM Distance: >3 FB Neck ROM: Full    Dental   Pulmonary    Pulmonary exam normal        Cardiovascular hypertension, Pt. on medications Normal cardiovascular exam     Neuro/Psych    GI/Hepatic   Endo/Other    Renal/GU      Musculoskeletal   Abdominal   Peds  Hematology   Anesthesia Other Findings   Reproductive/Obstetrics                             Anesthesia Physical Anesthesia Plan  ASA: II  Anesthesia Plan: Spinal   Post-op Pain Management:  Regional for Post-op pain   Induction: Intravenous  Airway Management Planned: Simple Face Mask  Additional Equipment:   Intra-op Plan:   Post-operative Plan:   Informed Consent: I have reviewed the patients History and Physical, chart, labs and discussed the procedure including the risks, benefits and alternatives for the proposed anesthesia with the patient or authorized representative who has indicated his/her understanding and acceptance.     Plan Discussed with: CRNA and Surgeon  Anesthesia Plan Comments:         Anesthesia Quick Evaluation

## 2016-08-25 NOTE — Anesthesia Postprocedure Evaluation (Signed)
Anesthesia Post Note  Patient: [name]  Procedure(s) Performed: Procedure(s) (LRB): TOTAL KNEE ARTHROPLASTY (Left)  Patient location during evaluation: PACU Anesthesia Type: General Level of consciousness: awake and alert Pain management: pain level controlled Vital Signs Assessment: post-procedure vital signs reviewed and stable Respiratory status: spontaneous breathing, nonlabored ventilation, respiratory function stable and patient connected to nasal cannula oxygen Cardiovascular status: blood pressure returned to baseline and stable Postop Assessment: no signs of nausea or vomiting Anesthetic complications: no    Last Vitals:  Vitals:   08/25/16 1406 08/25/16 1420  BP: (!) 136/91 137/77  Pulse: 86 74  Resp: 12 16  Temp: 36.7 C     Last Pain:  Vitals:   08/25/16 1432  TempSrc:   PainSc: Allen DAVID

## 2016-08-25 NOTE — Evaluation (Signed)
Physical Therapy Evaluation Patient Details Name: [name] MRN: [id number] DOB: 09/29/1966 Today's Date: 08/25/2016   History of Present Illness  Patient is a 50 y/o female with hx of HTN, obesity, endometrial adenocarcinoma presents s/p left TKA.  Clinical Impression  Patient presents with pain, nausea, lethargy and post surgical deficits s/p above surgery. Tolerated sitting EOB x 12 minutes with Min guard assist for safety but unable to perform further mobility secondary to decreased level of arousal and nausea. Pt given dilaudid in PACU and very sleepy. Pt independent PTA and plans to discharge home with support of sister. Will follow acutely to maximize independence and mobility prior to return home.     Follow Up Recommendations Home health PT;Supervision for mobility/OOB;Supervision/Assistance - 24 hour    Equipment Recommendations  None recommended by PT    Recommendations for Other Services OT consult     Precautions / Restrictions Precautions Precautions: Knee Precaution Booklet Issued: No Precaution Comments: Reviewed no pillow under knee and precautions Restrictions Weight Bearing Restrictions: Yes LLE Weight Bearing: Weight bearing as tolerated      Mobility  Bed Mobility Overal bed mobility: Needs Assistance Bed Mobility: Supine to Sit;Sit to Supine     Supine to sit: Min assist;HOB elevated Sit to supine: Min guard   General bed mobility comments: Assist to bring LLE to EOB, increased time. Use of rails. Able to bring LLE into bed without assist.   Transfers                 General transfer comment: Deferred secondary to lethargy and nausea sitting EOB.   Ambulation/Gait                Stairs            Wheelchair Mobility    Modified Rankin (Stroke Patients Only)       Balance Overall balance assessment: Needs assistance Sitting-balance support: Feet supported;No upper extremity supported Sitting balance-Leahy Scale:  Good                                       Pertinent Vitals/Pain Pain Assessment: Faces Faces Pain Scale: Hurts little more Pain Location: left knee Pain Descriptors / Indicators: Sore;Operative site guarding Pain Intervention(s): Monitored during session;Repositioned;Limited activity within patient's tolerance;Premedicated before session    [place] expects to be discharged to:: Private residence Living Arrangements: Other relatives (sister) Available Help at Discharge: Family;Available 24 hours/day Type of Home: House Home Access: Stairs to enter   [employer] of Steps: 1 step through threshold Home Layout: Two level;Able to live on main level with bedroom/bathroom Home Equipment: Gilford Rile - 2 wheels;Bedside commode;Cane - single point      Prior Function Level of Independence: Independent         Comments: Works as a Programme researcher, broadcasting/film/video sitting at [employer]        Extremity/Trunk Assessment   Upper Extremity Assessment: Defer to OT evaluation           Lower Extremity Assessment: LLE deficits/detail   LLE Deficits / Details: Limited AROM/strength secondary to pain/ post op     Communication   Communication: No difficulties  Cognition Arousal/Alertness: Lethargic;Suspect due to medications Behavior During Therapy: [facility] for tasks assessed/performed Overall Cognitive Status: Within Functional Limits for tasks assessed  General Comments General comments (skin integrity, edema, etc.): Sp02 88% on RA. Donned 02.    Exercises Total Joint Exercises Ankle Circles/Pumps: Both;10 reps;Supine Quad Sets: Both;5 reps;Supine   Assessment/Plan    PT Assessment Patient needs continued PT services  PT Problem List Decreased strength;Decreased mobility;Obesity;Decreased range of motion;Decreased activity tolerance;Cardiopulmonary status limiting activity;Decreased balance;Pain;Decreased  knowledge of use of DME;Impaired sensation;Decreased cognition          PT Treatment Interventions DME instruction;Therapeutic activities;Gait training;Therapeutic exercise;Patient/family education;Balance training;Stair training;Functional mobility training    PT Goals (Current goals can be found in the Care Plan section)  Acute Rehab PT Goals Patient Stated Goal: none stated PT Goal Formulation: With patient Time For Goal Achievement: 09/08/16 Potential to Achieve Goals: Fair    Frequency 7X/week   Barriers to discharge        Co-evaluation               End of Session Equipment Utilized During Treatment: Gait belt Activity Tolerance: Patient limited by lethargy;Other (comment) (nausea) Patient left: in bed;with call bell/phone within reach;with SCD's reapplied Nurse Communication: Mobility status         Time: [id number] PT Time Calculation (min) (ACUTE ONLY): 27 min   Charges:   PT Evaluation $PT Eval Low Complexity: 1 Procedure PT Treatments $Therapeutic Activity: 8-22 mins   PT G Codes:        [name] A Tonjua Rossetti 08/25/2016, 4:54 PM Wray Kearns, Benson, DPT [phone]

## 2016-08-26 ENCOUNTER — Encounter (HOSPITAL_COMMUNITY): Payer: Self-pay

## 2016-08-26 LAB — CBC
HCT: 36.7 % (ref 36.0–46.0)
Hemoglobin: 12 g/dL (ref 12.0–15.0)
MCH: 26.5 pg (ref 26.0–34.0)
MCHC: 32.7 g/dL (ref 30.0–36.0)
MCV: 81.2 fL (ref 78.0–100.0)
PLATELETS: 263 10*3/uL (ref 150–400)
RBC: 4.52 MIL/uL (ref 3.87–5.11)
RDW: 13.6 % (ref 11.5–15.5)
WBC: 16 10*3/uL — AB (ref 4.0–10.5)

## 2016-08-26 LAB — BASIC METABOLIC PANEL
ANION GAP: 7 (ref 5–15)
BUN: 11 mg/dL (ref 6–20)
CO2: 25 mmol/L (ref 22–32)
Calcium: 9 mg/dL (ref 8.9–10.3)
Chloride: 104 mmol/L (ref 101–111)
Creatinine, Ser: 0.84 mg/dL (ref 0.44–1.00)
GFR calc Af Amer: >60 mL/min (ref >60)
GLUCOSE: 128 mg/dL — AB (ref 65–99)
Potassium: 4.1 mmol/L (ref 3.5–5.1)
SODIUM: 136 mmol/L (ref 135–145)

## 2016-08-26 NOTE — Progress Notes (Signed)
Physical Therapy Treatment Patient Details Name: [name] MRN: [id number] DOB: 12/16/1965 Today's Date: 08/26/2016    History of Present Illness Patient is a 50 y/o female with hx of HTN, obesity, endometrial adenocarcinoma presents s/p left TKA.    PT Comments    Patient continues to progress toward mobility goals. Pt tolerated increased gait distance and stair training this session. Current plan remains appropriate.   Follow Up Recommendations  Home health PT;Supervision for mobility/OOB;Supervision/Assistance - 24 hour     Equipment Recommendations  None recommended by PT    Recommendations for Other Services OT consult     Precautions / Restrictions Precautions Precautions: Knee Precaution Booklet Issued: No Precaution Comments: Reviewed no pillow under knee and precautions Restrictions Weight Bearing Restrictions: Yes LLE Weight Bearing: Weight bearing as tolerated    Mobility  Bed Mobility Overal bed mobility: Modified Independent Bed Mobility: Sit to Supine;Supine to Sit       Sit to supine: Supervision   General bed mobility comments: increased time and effort  Transfers Overall transfer level: Needs assistance Equipment used: Rolling Sotelo (2 wheeled) Transfers: Sit to/from Stand Sit to Stand: Supervision         General transfer comment: cues for hand placement  Ambulation/Gait Ambulation/Gait assistance: Min guard Ambulation Distance (Feet): 250 Feet Assistive device: Rolling Bluett (2 wheeled) Gait Pattern/deviations: Step-through pattern;Decreased stance time - left;Decreased step length - right;Decreased weight shift to left     General Gait Details: cues for posture, increased L knee flexion during swing phase, and step length symmetry   Stairs Stairs: Yes Stairs assistance: Min guard Stair Management: One rail Left;Sideways;Step to pattern Number of Stairs: 10 General stair comments: pt educated on sequencing and technique  using L hand rail to simulate stairs inside home  Wheelchair Mobility    Modified Rankin (Stroke Patients Only)       Balance Overall balance assessment: Needs assistance   Sitting balance-Leahy Scale: Good       Standing balance-Leahy Scale: Fair                      Cognition Arousal/Alertness: Awake/alert Behavior During Therapy: WFL for tasks assessed/performed Overall Cognitive Status: Within Functional Limits for tasks assessed                      Exercises      General Comments        Pertinent Vitals/Pain Pain Assessment: Faces Pain Score: 8  Faces Pain Scale: Hurts little more Pain Location: L knee Pain Descriptors / Indicators: Guarding;Sore;Tightness Pain Intervention(s): Limited activity within patient's tolerance;Monitored during session;Premedicated before session;Repositioned    Home Living Family/patient expects to be discharged to:: Private residence Living Arrangements: Other relatives (sister) Available Help at Discharge: Family;Available 24 hours/day Type of Home: House Home Access: Stairs to enter   Home Layout: Two level;Able to live on main level with bedroom/bathroom Home Equipment: Gilford Rile - 2 wheels;Bedside commode;Cane - single point      Prior Function Level of Independence: Independent      Comments: Works as a Programme researcher, broadcasting/film/video sitting at desk   PT Goals (current goals can now be found in the care plan section) Acute Rehab PT Goals Patient Stated Goal: none stated Progress towards PT goals: Progressing toward goals    Frequency    7X/week      PT Plan Current plan remains appropriate    Co-evaluation  End of Session Equipment Utilized During Treatment: Gait belt Activity Tolerance: Patient tolerated treatment well Patient left: with call bell/phone within reach;in bed     Time: [id number] PT Time Calculation (min) (ACUTE ONLY): 30 min  Charges:  $Gait Training: 23-37 mins                     G Codes:      Salina April, PTA Pager: [phone]   08/26/2016, 5:03 PM

## 2016-08-26 NOTE — Progress Notes (Signed)
Physical Therapy Treatment Patient Details Name: [name] MRN: [id number] DOB: [birth year] Today's Date: 08/26/2016    History of Present Illness Patient is a 50 y/o female with hx of HTN, obesity, endometrial adenocarcinoma presents s/p left TKA.    PT Comments    Patient is progressing well toward mobility goals. Patient needs to practice stairs next session.     Follow Up Recommendations  Home health PT;Supervision for mobility/OOB;Supervision/Assistance - 24 hour     Equipment Recommendations  None recommended by PT    Recommendations for Other Services OT consult     Precautions / Restrictions Precautions Precautions: Knee Precaution Comments: Reviewed no pillow under knee and precautions Restrictions Weight Bearing Restrictions: Yes LLE Weight Bearing: Weight bearing as tolerated    Mobility  Bed Mobility               General bed mobility comments: OOB in chair upon arrival  Transfers Overall transfer level: Needs assistance Equipment used: Rolling Kehm (2 wheeled) Transfers: Sit to/from Stand Sit to Stand: Supervision         General transfer comment: safe hand placement   Ambulation/Gait Ambulation/Gait assistance: Min guard Ambulation Distance (Feet): 200 Feet Assistive device: Rolling Cosby (2 wheeled) Gait Pattern/deviations: Step-through pattern;Decreased stance time - left;Decreased step length - right;Decreased weight shift to left     General Gait Details: cues for posture, proximity of RW and sequencing; pt with goot step through and heel strike   Stairs            Wheelchair Mobility    Modified Rankin (Stroke Patients Only)       Balance                                    Cognition Arousal/Alertness: Awake/alert Behavior During Therapy: WFL for tasks assessed/performed Overall Cognitive Status: Within Functional Limits for tasks assessed                      Exercises Total Joint  Exercises Quad Sets: AROM;Left;10 reps Heel Slides: AROM;Left;10 reps Hip ABduction/ADduction: AROM;Left;10 reps Long Arc Quad: AROM;Left;10 reps Goniometric ROM: ~85 degrees flexion in sitting    General Comments        Pertinent Vitals/Pain Pain Assessment: Faces Faces Pain Scale: Hurts little more Pain Location: L knee Pain Descriptors / Indicators: Guarding;Grimacing;Sore Pain Intervention(s): Limited activity within patient's tolerance;Monitored during session;Repositioned;RN gave pain meds during session;Ice applied    Home Living                      Prior Function            PT Goals (current goals can now be found in the care plan section) Acute Rehab PT Goals Patient Stated Goal: none stated Progress towards PT goals: Progressing toward goals    Frequency    7X/week      PT Plan Current plan remains appropriate    Co-evaluation             End of Session Equipment Utilized During Treatment: Gait belt Activity Tolerance: Patient tolerated treatment well Patient left: in chair;with call bell/phone within reach     Time: 0852-0922 PT Time Calculation (min) (ACUTE ONLY): 30 min  Charges:  $Gait Training: 8-22 mins $Therapeutic Exercise: 8-22 mins  G Codes:      Salina April, PTA Pager: [phone]   08/26/2016, 9:38 AM

## 2016-08-26 NOTE — Evaluation (Signed)
Occupational Therapy Evaluation Patient Details Name: [name] MRN: [id number] DOB: 1966-02-23 Today's Date: 08/26/2016    History of Present Illness Patient is a 50 y/o female with hx of HTN, obesity, endometrial adenocarcinoma presents s/p left TKA.   Clinical Impression   Pt doing very well overall supervision for selfcare tasks at this time except for simulated donning of shoe on the left foot.  Will likely need min assist initially which she will have from her sister.  No further OT needs at this time or follow-up recommended.      Follow Up Recommendations  No OT follow up    Equipment Recommendations  Other (comment) (Pt to purchase shower seat online)       Precautions / Restrictions Precautions Precautions: Knee Precaution Booklet Issued: No Restrictions Weight Bearing Restrictions: No LLE Weight Bearing: Weight bearing as tolerated      Mobility Bed Mobility Overal bed mobility: Needs Assistance Bed Mobility: Sit to Supine       Sit to supine: Supervision      Transfers Overall transfer level: Needs assistance Equipment used: Rolling Gruenberg (2 wheeled)   Sit to Stand: Supervision         General transfer comment: Min instructional cueing for hand placement with sit to stand.     Balance Overall balance assessment: Needs assistance   Sitting balance-Leahy Scale: Good       Standing balance-Leahy Scale: Fair                              ADL Overall ADL's : Needs assistance/impaired Eating/Feeding: Independent   Grooming: Wash/dry hands;Wash/dry face;Supervision/safety   Upper Body Bathing: Set up;Sitting   Lower Body Bathing: Sit to/from stand;Supervison/ safety   Upper Body Dressing : Set up;Sitting   Lower Body Dressing: Minimal assistance;Sit to/from stand   Toilet Transfer: Supervision/safety;RW;Ambulation   Toileting- Water quality scientist and Hygiene: Supervision/safety;Sit to/from stand   Tub/ Shower  Transfer: Supervision/safety;Rolling Heiberger;Anterior/posterior   Functional mobility during ADLs: Supervision/safety;Rolling Marchena General ADL Comments: Pt plans to look online for a shower seat to fit in her walk-in shower.   She already has wide BSC     Vision Vision Assessment?: No apparent visual deficits   Perception Perception Perception Tested?: No   Praxis Praxis Praxis tested?: Within functional limits    Pertinent Vitals/Pain Pain Assessment: 0-10 Pain Score: 8  Pain Location: left knee     Hand Dominance Right   Extremity/Trunk Assessment Upper Extremity Assessment Upper Extremity Assessment: Overall WFL for tasks assessed   Lower Extremity Assessment Lower Extremity Assessment: Defer to PT evaluation   Cervical / Trunk Assessment Cervical / Trunk Assessment: Normal   Communication Communication Communication: No difficulties   Cognition Arousal/Alertness: Awake/alert Behavior During Therapy: WFL for tasks assessed/performed Overall Cognitive Status: Within Functional Limits for tasks assessed                                Home Living Family/patient expects to be discharged to:: Private residence Living Arrangements: Other relatives (sister) Available Help at Discharge: Family;Available 24 hours/day Type of Home: House Home Access: Stairs to enter [employer] of Steps: 1 step through threshold   Home Layout: Two level;Able to live on main level with bedroom/bathroom     Bathroom Shower/Tub: Walk-in shower;Door   [employer] Toilet: Associate Professor Accessibility: Yes   Home Equipment: Gilford Rile -  2 wheels;Bedside commode;Cane - single point          Prior Functioning/Environment Level of Independence: Independent        Comments: Works as a Programme researcher, broadcasting/film/video sitting at [employer] During Treatment: Rolling How CPM Left Knee CPM Left Knee:  Off Nurse Communication: Mobility status  Activity Tolerance: Patient tolerated treatment well Patient left: in bed;with call bell/phone within reach   Time: 1337-1414 OT Time Calculation (min): 37 min Charges:  OT General Charges $OT Visit: 1 Procedure OT Evaluation $OT Eval Moderate Complexity: 1 Procedure OT Treatments $Self Care/Home Management : 23-37 mins  Mayleen Borrero OTR/L 08/26/2016, 2:24 PM

## 2016-08-26 NOTE — Progress Notes (Signed)
Subjective: 1 Day Post-Op Procedure(s) (LRB): TOTAL KNEE ARTHROPLASTY (Left) Patient reports pain as 4 on 0-10 scale.    Objective: Vital signs in last 24 hours: Temp:  [97.4 F (36.3 C)-98.9 F (37.2 C)] 98.9 F (37.2 C) (11/28 0550) Pulse Rate:  [53-86] 63 (11/28 0550) Resp:  [8-20] 20 (11/28 0550) BP: (100-142)/(55-91) 120/71 (11/28 0550) SpO2:  [97 %-100 %] 100 % (11/28 0550)  Intake/Output from previous day: 11/27 0701 - 11/28 0700 In: 1740 [P.O.:240; I.V.:1500] Out: 1415 [Urine:1215; Blood:200] Intake/Output this shift: No intake/output data recorded.   Recent Labs  08/26/16 0446  HGB 12.0    Recent Labs  08/26/16 0446  WBC 16.0*  RBC 4.52  HCT 36.7  PLT 263    Recent Labs  08/26/16 0446  NA 136  K 4.1  CL 104  CO2 25  BUN 11  CREATININE 0.84  GLUCOSE 128*  CALCIUM 9.0   No results for input(s): LABPT, INR in the last 72 hours.  ABD soft Neurovascular intact Sensation intact distally Intact pulses distally Dorsiflexion/Plantar flexion intact Incision: dressing C/D/I  Assessment/Plan: 1 Day Post-Op Procedure(s) (LRB): TOTAL KNEE ARTHROPLASTY (Left)  Principal Problem:   Primary osteoarthritis of left knee Active Problems:   Hypertension   Obesity   Pain in joint, multiple sites   History of endometrial cancer   Primary localized osteoarthritis of left knee  Advance diet Up with therapy Plan for discharge tomorrow   Patient ambulating slowly and fatigues easily.  She will need to go up 17 stairs prior to discharge.  Please continue to work with her to build her endurance.  [name] J 08/26/2016, 9:03 AM

## 2016-08-26 NOTE — Progress Notes (Signed)
Orthopedic Tech Progress Note Patient Details:  [name] 05/25/1966 [id number]  CPM Left Knee CPM Left Knee: On Left Knee Flexion (Degrees): 55 Left Knee Extension (Degrees): 0 Additional Comments: zero degree knee foam donned   [place] Pink 08/26/2016, 1:09 PM

## 2016-08-26 NOTE — Discharge Instructions (Signed)
Information on my medicine - XARELTO® (Rivaroxaban) ° °This medication education was reviewed with me or my healthcare representative as part of my discharge preparation.  The pharmacist that spoke with me during my hospital stay was:  Xzaviar Maloof Dien, RPH ° °Why was Xarelto® prescribed for you? °Xarelto® was prescribed for you to reduce the risk of blood clots forming after orthopedic surgery. The medical term for these abnormal blood clots is venous thromboembolism (VTE). ° °What do you need to know about xarelto® ? °Take your Xarelto® ONCE DAILY at the same time every day. °You may take it either with or without food. ° °If you have difficulty swallowing the tablet whole, you may crush it and mix in applesauce just prior to taking your dose. ° °Take Xarelto® exactly as prescribed by your doctor and DO NOT stop taking Xarelto® without talking to the doctor who prescribed the medication.  Stopping without other VTE prevention medication to take the place of Xarelto® may increase your risk of developing a clot. ° °After discharge, you should have regular check-up appointments with your healthcare provider that is prescribing your Xarelto®.   ° °What do you do if you miss a dose? °If you miss a dose, take it as soon as you remember on the same day then continue your regularly scheduled once daily regimen the next day. Do not take two doses of Xarelto® on the same day.  ° °Important Safety Information °A possible side effect of Xarelto® is bleeding. You should call your healthcare provider right away if you experience any of the following: °? Bleeding from an injury or your nose that does not stop. °? Unusual colored urine (red or dark brown) or unusual colored stools (red or black). °? Unusual bruising for unknown reasons. °? A serious fall or if you hit your head (even if there is no bleeding). ° °Some medicines may interact with Xarelto® and might increase your risk of bleeding while on Xarelto®. To help avoid  this, consult your healthcare provider or pharmacist prior to using any new prescription or non-prescription medications, including herbals, vitamins, non-steroidal anti-inflammatory drugs (NSAIDs) and supplements. ° °This website has more information on Xarelto®: www.xarelto.com. ° ° ° °

## 2016-08-26 NOTE — Progress Notes (Signed)
Orthopedic Tech Progress Note Patient Details:  [name] 08-10-1966 [id number]  Patient ID: [name], female   DOB: 11-Jan-1966, 50 y.o.   MRN: [id number] Applied cpm 0-55  [name] 08/26/2016, 5:47 AM

## 2016-08-26 NOTE — Plan of Care (Signed)
Problem: Tissue Perfusion: Goal: Risk factors for ineffective tissue perfusion will decrease Outcome: Progressing No s/s of dvt noted  Problem: Nutrition: Goal: Adequate nutrition will be maintained Outcome: Progressing Tolerated food well  Problem: Bowel/Gastric: Goal: Will not experience complications related to bowel motility Outcome: Progressing No issues reported

## 2016-08-27 ENCOUNTER — Other Ambulatory Visit: Payer: Self-pay | Admitting: Physician Assistant

## 2016-08-27 DIAGNOSIS — Z96652 Presence of left artificial knee joint: Secondary | ICD-10-CM

## 2016-08-27 DIAGNOSIS — M1712 Unilateral primary osteoarthritis, left knee: Secondary | ICD-10-CM

## 2016-08-27 LAB — CBC
HEMATOCRIT: 32.9 % — AB (ref 36.0–46.0)
Hemoglobin: 10.6 g/dL — ABNORMAL LOW (ref 12.0–15.0)
MCH: 26.2 pg (ref 26.0–34.0)
MCHC: 32.2 g/dL (ref 30.0–36.0)
MCV: 81.4 fL (ref 78.0–100.0)
Platelets: 247 10*3/uL (ref 150–400)
RBC: 4.04 MIL/uL (ref 3.87–5.11)
RDW: 13.9 % (ref 11.5–15.5)
WBC: 14.6 10*3/uL — AB (ref 4.0–10.5)

## 2016-08-27 LAB — BASIC METABOLIC PANEL
ANION GAP: 8 (ref 5–15)
BUN: 16 mg/dL (ref 6–20)
CHLORIDE: 105 mmol/L (ref 101–111)
CO2: 27 mmol/L (ref 22–32)
Calcium: 9.1 mg/dL (ref 8.9–10.3)
Creatinine, Ser: 0.8 mg/dL (ref 0.44–1.00)
GFR calc Af Amer: >60 mL/min (ref >60)
GFR calc non Af Amer: >60 mL/min (ref >60)
GLUCOSE: 125 mg/dL — AB (ref 65–99)
POTASSIUM: 4.5 mmol/L (ref 3.5–5.1)
Sodium: 140 mmol/L (ref 135–145)

## 2016-08-27 MED ORDER — POLYETHYLENE GLYCOL 3350 17 G PO PACK: PACK | ORAL | 0 refills | Status: DC

## 2016-08-27 MED ORDER — RIVAROXABAN 10 MG PO TABS: 10.0000 mg | ORAL_TABLET | Freq: Every day | ORAL | 0 refills | Status: DC

## 2016-08-27 MED ORDER — OXYCODONE HCL 5 MG PO TABS: ORAL_TABLET | ORAL | 0 refills | Status: DC

## 2016-08-27 MED ORDER — ACETAMINOPHEN 325 MG PO TABS: 650.0000 mg | ORAL_TABLET | Freq: Four times a day (QID) | ORAL | Status: DC | PRN

## 2016-08-27 MED ORDER — DOCUSATE SODIUM 100 MG PO CAPS: ORAL_CAPSULE | ORAL | 0 refills | Status: DC

## 2016-08-27 NOTE — Progress Notes (Signed)
Orthopedic Tech Progress Note Patient Details:  [name] 03-05-1966 [id number]  Patient ID: [name], female   DOB: 25-Jan-1966, 50 y.o.   MRN: [id number] Applied cpm 0-65  [name] 08/27/2016, 6:52 AM

## 2016-08-27 NOTE — Progress Notes (Signed)
Physical Therapy Treatment Patient Details Name: [name] MRN: [id number] DOB: 1966-03-16 Today's Date: 08/27/2016    History of Present Illness Patient is a 50 y/o female with hx of HTN, obesity, endometrial adenocarcinoma presents s/p left TKA.    PT Comments    Patient continues to progress toward mobility goals. Practiced stairs again this session and pt demonstrated good carry over of safe technique. Reviewed HEP and frequency. Current plan remains appropriate.   Follow Up Recommendations  Home health PT;Supervision for mobility/OOB;Supervision/Assistance - 24 hour     Equipment Recommendations  None recommended by PT    Recommendations for Other Services OT consult     Precautions / Restrictions Precautions Precautions: Knee Precaution Comments: Reviewed no pillow under knee and precautions Restrictions Weight Bearing Restrictions: Yes LLE Weight Bearing: Weight bearing as tolerated    Mobility  Bed Mobility Overal bed mobility: Modified Independent Bed Mobility: Supine to Sit           General bed mobility comments: increased time and effort  Transfers Overall transfer level: Needs assistance Equipment used: Rolling Blyden (2 wheeled) Transfers: Sit to/from Stand Sit to Stand: Supervision         General transfer comment: carry over of safe hand placement  Ambulation/Gait Ambulation/Gait assistance: Supervision Ambulation Distance (Feet): 250 Feet Assistive device: Rolling Baldini (2 wheeled) Gait Pattern/deviations: Step-through pattern;Decreased stance time - left;Decreased step length - right;Decreased weight shift to left;Antalgic;Trunk flexed     General Gait Details: cues for posture and L heel strike; pt with increased cadence with increased distance   Stairs   Stairs assistance: Min guard Stair Management: One rail Left;Sideways;Step to pattern Number of Stairs: 10 General stair comments: pt with carrry over of technique; min  guard for safety  Wheelchair Mobility    Modified Rankin (Stroke Patients Only)       Balance                                    Cognition Arousal/Alertness: Awake/alert Behavior During Therapy: WFL for tasks assessed/performed Overall Cognitive Status: Within Functional Limits for tasks assessed                      Exercises Total Joint Exercises Heel Slides: AROM;Left;10 reps Long Arc Quad: AROM;Left;10 reps Knee Flexion: AROM;Left;5 reps;Other (comment) (10 sec holds) Goniometric ROM: 5-80    General Comments        Pertinent Vitals/Pain Pain Assessment: Faces Faces Pain Scale: Hurts little more Pain Location: L knee Pain Descriptors / Indicators: Aching;Sore;Tightness Pain Intervention(s): Limited activity within patient's tolerance;Monitored during session;Premedicated before session;Repositioned    Home Living                      Prior Function            PT Goals (current goals can now be found in the care plan section) Acute Rehab PT Goals Patient Stated Goal: go home Progress towards PT goals: Progressing toward goals    Frequency    7X/week      PT Plan Current plan remains appropriate    Co-evaluation             End of Session Equipment Utilized During Treatment: Gait belt Activity Tolerance: Patient tolerated treatment well Patient left: with call bell/phone within reach;in bed     Time: [id number] PT Time Calculation (min) (ACUTE  ONLY): 38 min  Charges:  $Gait Training: 23-37 mins $Therapeutic Exercise: 8-22 mins                    G Codes:      Salina April, PTA Pager: [phone]   08/27/2016, 12:19 PM

## 2016-08-27 NOTE — Progress Notes (Signed)
Orthopedic Tech Progress Note Patient Details:  ZO MARET [birth year] [id number]  Patient ID: [name], female   DOB: Sep 10, 1966, 50 y.o.   MRN: [id number]   [name] 08/27/2016, 1:15 PM Placed pt's lle on cpm @0 -65 degrees @1315 

## 2016-08-27 NOTE — Plan of Care (Signed)
Problem: Activity: Goal: Ability to avoid complications of mobility impairment will improve Outcome: Progressing Ambulates well with minimal assistance and tolerates well Goal: Will remain free from falls Outcome: Progressing No fall or injury noted  Problem: Physical Regulation: Goal: Postoperative complications will be avoided or minimized Outcome: Progressing No post op complications noted  Problem: Pain Management: Goal: Pain level will decrease with appropriate interventions Outcome: Progressing Medicated once for pain with full relief  Problem: Safety: Goal: Ability to remain free from injury will improve Outcome: Progressing No fall or injury noted this shift  Problem: Tissue Perfusion: Goal: Risk factors for ineffective tissue perfusion will decrease Outcome: Progressing No s/s of dvt noted  Problem: Bowel/Gastric: Goal: Will not experience complications related to bowel motility Outcome: Progressing Denies gastric and bowel issues

## 2016-08-27 NOTE — Discharge Summary (Signed)
Patient ID: [name] MRN: [id number] DOB/AGE: 10-19-1965 50 y.o.  Admit date: 08/25/2016 Discharge date: 08/27/2016  Admission Diagnoses:  Principal Problem:   Primary osteoarthritis of left knee Active Problems:   Hypertension   Obesity   Pain in joint, multiple sites   History of endometrial cancer   Primary localized osteoarthritis of left knee   Discharge Diagnoses:  Same  Past Medical History:  Diagnosis Date  . Arthritis   . 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 Hypertension   . Obesity    S/P lap band 2010    Surgeries: Procedure(s): TOTAL KNEE ARTHROPLASTY on 08/25/2016   Consultants:   Discharged Condition: Improved  Hospital Course: [name] is an 50 y.o. female who was admitted 08/25/2016 for operative treatment ofPrimary osteoarthritis of left knee. Patient has severe unremitting pain that affects sleep, daily activities, and work/hobbies. After pre-op clearance the patient was taken to the operating room on 08/25/2016 and underwent  Procedure(s): TOTAL KNEE ARTHROPLASTY.    Patient was given perioperative antibiotics: Anti-infectives    Start     Dose/Rate Route Frequency Ordered Stop   08/25/16 1730  ceFAZolin (ANCEF) IVPB 2g/100 mL premix     2 g 200 mL/hr over 30 Minutes Intravenous Every 6 hours 08/25/16 1556 08/26/16 0039   08/25/16 1100  ceFAZolin (ANCEF) 3 g in dextrose 5 % 50 mL IVPB     3 g 130 mL/hr over 30 Minutes Intravenous To ShortStay Surgical 08/22/16 1147 08/25/16 1202       Patient was given sequential compression devices, early ambulation, and chemoprophylaxis to prevent DVT.  Patient benefited maximally from hospital stay and there were no complications.    Recent vital signs: Patient Vitals for the past 24 hrs:  BP Temp Temp src Pulse Resp SpO2  08/27/16 0521 124/68 98.6 F (37 C) Oral 76 17 98 %  08/26/16 2249 110/69 98 F (36.7 C) Oral 71 17 96 %  08/26/16 1346 (!) 156/67 99.6 F (37.6 C) Oral 79 17  100 %     Recent laboratory studies:  Recent Labs  08/26/16 0446 08/27/16 0505  WBC 16.0* 14.6*  HGB 12.0 10.6*  HCT 36.7 32.9*  PLT 263 247  NA 136 140  K 4.1 4.5  CL 104 105  CO2 25 27  BUN 11 16  CREATININE 0.84 0.80  GLUCOSE 128* 125*  CALCIUM 9.0 9.1     Discharge Medications:     Medication List    STOP taking these medications   celecoxib 200 MG capsule Commonly known as:  CELEBREX   ibuprofen 200 MG tablet Commonly known as:  ADVIL,MOTRIN   naproxen sodium 220 MG tablet Commonly known as:  ANAPROX     TAKE these medications   acetaminophen 325 MG tablet Commonly known as:  TYLENOL Take 2 tablets (650 mg total) by mouth every 6 (six) hours as needed for mild pain (or Fever >/= 101).   docusate sodium 100 MG capsule Commonly known as:  COLACE 1 tab 2 times a day while on narcotics.  STOOL SOFTENER   oxyCODONE 5 MG immediate release tablet Commonly known as:  Oxy IR/ROXICODONE 1-2 tablets every 4-6 hrs as needed for pain   polyethylene glycol packet Commonly known as:  MIRALAX / GLYCOLAX 17grams in 6 oz of water twice a day until bowel movement.  LAXITIVE.  Restart if two days since last bowel movement   rivaroxaban 10 MG Tabs tablet Commonly known as:  XARELTO Take  1 tablet (10 mg total) by mouth daily with breakfast. Start taking on:  08/28/2016   triamterene-hydrochlorothiazide 37.5-25 MG tablet Commonly known as:  MAXZIDE-25 Take 1 tablet by mouth Daily.       Diagnostic Studies: No results found.  Disposition: 01-Home or Self Care  Discharge Instructions    CPM    Complete by:  As directed    Continuous passive motion machine (CPM):      Use the CPM from 0 to 90 for 6 hours per day.       You may break it up into 2 or 3 sessions per day.      Use CPM for 2 weeks or until you are told to stop.   Call MD / Call 911    Complete by:  As directed    If you experience chest pain or shortness of breath, CALL 911 and be transported to  the hospital emergency room.  If you develope a fever above 101 F, pus (white drainage) or increased drainage or redness at the wound, or calf pain, call your surgeon's office.   Change dressing    Complete by:  As directed    Change the gauze dressing daily with sterile 4 x 4 inch gauze and apply TED hose.  DO NOT REMOVE BANDAGE OVER SURGICAL INCISION.  [place] WHOLE LEG INCLUDING OVER THE WATERPROOF BANDAGE WITH SOAP AND WATER EVERY DAY.   Constipation Prevention    Complete by:  As directed    Drink plenty of fluids.  Prune juice may be helpful.  You may use a stool softener, such as Colace (over the counter) 100 mg twice a day.  Use MiraLax (over the counter) for constipation as needed.   Diet - low sodium heart healthy    Complete by:  As directed    Discharge instructions    Complete by:  As directed    INSTRUCTIONS AFTER JOINT REPLACEMENT   Remove items at home which could result in a fall. This includes throw rugs or furniture in walking pathways ICE to the affected joint every three hours while awake for 30 minutes at a time, for at least the first 3-5 days, and then as needed for pain and swelling.  Continue to use ice for pain and swelling. You may notice swelling that will progress down to the foot and ankle.  This is normal after surgery.  Elevate your leg when you are not up walking on it.   Continue to use the breathing machine you got in the hospital (incentive spirometer) which will help keep your temperature down.  It is common for your temperature to cycle up and down following surgery, especially at night when you are not up moving around and exerting yourself.  The breathing machine keeps your lungs expanded and your temperature down.   DIET:  As you were doing prior to hospitalization, we recommend a well-balanced diet.  DRESSING / WOUND CARE / SHOWERING  Keep the surgical dressing until follow up.  The dressing is water proof, so you can shower without any extra covering.   IF THE DRESSING FALLS OFF or the wound gets wet inside, change the dressing with sterile gauze.  Please use good hand washing techniques before changing the dressing.  Do not use any lotions or creams on the incision until instructed by your surgeon.    ACTIVITY  Increase activity slowly as tolerated, but follow the weight bearing instructions below.   No driving for 6 weeks  or until further direction given by your physician.  You cannot drive while taking narcotics.  No lifting or carrying greater than 10 lbs. until further directed by your surgeon. Avoid periods of inactivity such as sitting longer than an hour when not asleep. This helps prevent blood clots.  You may return to work once you are authorized by your doctor.     WEIGHT BEARING   Weight bearing as tolerated with assist device (Marcou, cane, etc) as directed, use it as long as suggested by your surgeon or therapist, typically at least 2-3 weeks.   EXERCISES  Results after joint replacement surgery are often greatly improved when you follow the exercise, range of motion and muscle strengthening exercises prescribed by your doctor. Safety measures are also important to protect the joint from further injury. Any time any of these exercises cause you to have increased pain or swelling, decrease what you are doing until you are comfortable again and then slowly increase them. If you have problems or questions, call your caregiver or physical therapist for advice.   Rehabilitation is important following a joint replacement. After just a few days of immobilization, the muscles of the leg can become weakened and shrink (atrophy).  These exercises are designed to build up the tone and strength of the thigh and leg muscles and to improve motion. Often times heat used for twenty to thirty minutes before working out will loosen up your tissues and help with improving the range of motion but do not use heat for the first two weeks following  surgery (sometimes heat can increase post-operative swelling).   These exercises can be done on a training (exercise) mat, on the floor, on a table or on a bed. Use whatever works the best and is most comfortable for you.    Use music or television while you are exercising so that the exercises are a pleasant break in your day. This will make your life better with the exercises acting as a break in your routine that you can look forward to.   Perform all exercises about fifteen times, three times per day or as directed.  You should exercise both the operative leg and the other leg as well.   Exercises include:  Quad Sets - Tighten up the muscle on the front of the thigh (Quad) and hold for 5-10 seconds.   Straight Leg Raises - With your knee straight (if you were given a brace, keep it on), lift the leg to 60 degrees, hold for 3 seconds, and slowly lower the leg.  Perform this exercise against resistance later as your leg gets stronger.  Leg Slides: Lying on your back, slowly slide your foot toward your buttocks, bending your knee up off the floor (only go as far as is comfortable). Then slowly slide your foot back down until your leg is flat on the floor again.  Angel Wings: Lying on your back spread your legs to the side as far apart as you can without causing discomfort.  Hamstring Strength:  Lying on your back, push your heel against the floor with your leg straight by tightening up the muscles of your buttocks.  Repeat, but this time bend your knee to a comfortable angle, and push your heel against the floor.  You may put a pillow under the heel to make it more comfortable if necessary.   A rehabilitation program following joint replacement surgery can speed recovery and prevent re-injury in the future due to weakened muscles.  Contact your doctor or a physical therapist for more information on knee rehabilitation.    CONSTIPATION  Constipation is defined medically as fewer than three stools per  week and severe constipation as less than one stool per week.  Even if you have a regular bowel pattern at home, your normal regimen is likely to be disrupted due to multiple reasons following surgery.  Combination of anesthesia, postoperative narcotics, change in appetite and fluid intake all can affect your bowels.   YOU MUST use at least one of the following options; they are listed in order of increasing strength to get the job done.  They are all available over the counter, and you may need to use some, POSSIBLY even all of these options:    Drink plenty of fluids (prune juice may be helpful) and high fiber foods Colace 100 mg by mouth twice a day  Senokot for constipation as directed and as needed Dulcolax (bisacodyl), take with full glass of water  Miralax (polyethylene glycol) once or twice a day as needed.  If you have tried all these things and are unable to have a bowel movement in the first 3-4 days after surgery call either your surgeon or your primary doctor.    If you experience loose stools or diarrhea, hold the medications until you stool forms back up.  If your symptoms do not get better within 1 week or if they get worse, check with your doctor.  If you experience "the worst abdominal pain ever" or develop nausea or vomiting, please contact the office immediately for further recommendations for treatment.   ITCHING:  If you experience itching with your medications, try taking only a single pain pill, or even half a pain pill at a time.  You can also use Benadryl over the counter for itching or also to help with sleep.   TED HOSE STOCKINGS:  Use stockings on both legs until for at least 2 weeks or as directed by physician office. They may be removed at night for sleeping.  MEDICATIONS:  See your medication summary on the "After Visit Summary" that nursing will review with you.  You may have some home medications which will be placed on hold until you complete the course of blood  thinner medication.  It is important for you to complete the blood thinner medication as prescribed.  PRECAUTIONS:  If you experience chest pain or shortness of breath - call 911 immediately for transfer to the hospital emergency department.   If you develop a fever greater that 101 F, purulent drainage from wound, increased redness or drainage from wound, foul odor from the wound/dressing, or calf pain - CONTACT YOUR SURGEON.                                                   FOLLOW-UP APPOINTMENTS:  If you do not already have a post-op appointment, please call the office for an appointment to be seen by your surgeon.  Guidelines for how soon to be seen are listed in your "After Visit Summary", but are typically between 1-4 weeks after surgery.  OTHER INSTRUCTIONS:   Knee Replacement:  Do not place pillow under knee, focus on keeping the knee straight while resting. CPM instructions: 0-90 degrees, 2 hours in the morning, 2 hours in the afternoon, and 2 hours in the evening.  Place foam block, curve side up under heel at all times except when in CPM or when walking.  DO NOT modify, tear, cut, or change the foam block in any way.  MAKE SURE YOU:  Understand these instructions.  Get help right away if you are not doing well or get worse.    Thank you for letting us be a part of your medical care team.  It is a privilege we respect greatly.  We hope these instructions will help you stay on track for a fast and full recovery!   Do not put a pillow under the knee. Place it under the heel.    Complete by:  As directed    Place gray foam block, curve side up under heel at all times except when in CPM or when walking.  DO NOT modify, tear, cut, or change in any way the gray foam block.   Increase activity slowly as tolerated    Complete by:  As directed    Patient may shower    Complete by:  As directed    Aquacel dressing is water proof    Wash over it and the whole leg with soap and water at the end  of your shower   TED hose    Complete by:  As directed    Use stockings (TED hose) for 2 weeks on both leg(s).  You may remove them at night for sleeping.      Follow-up Information    Lorn Junes, MD Follow up on 09/08/2016.   Specialty:  Orthopedic Surgery Why:  appt time 2:15pm Contact information: [address] [place] [address] [phone]        [facility] Follow up.   Specialty:  Home Health Services Why:  Someone from [facility] will contact you to arrange start date and time for therapy. Contact information: [place] [address] [phone]            Signed: Linda Hedges 08/27/2016, 1:34 PM

## 2016-08-29 DIAGNOSIS — Z471 Aftercare following joint replacement surgery: Secondary | ICD-10-CM | POA: Diagnosis not present

## 2016-08-29 DIAGNOSIS — Z6841 Body Mass Index (BMI) 40.0 and over, adult: Secondary | ICD-10-CM | POA: Diagnosis not present

## 2016-08-29 DIAGNOSIS — E669 Obesity, unspecified: Secondary | ICD-10-CM | POA: Diagnosis not present

## 2016-08-29 DIAGNOSIS — Z96652 Presence of left artificial knee joint: Secondary | ICD-10-CM | POA: Diagnosis not present

## 2016-08-29 DIAGNOSIS — M6281 Muscle weakness (generalized): Secondary | ICD-10-CM | POA: Diagnosis not present

## 2016-08-29 DIAGNOSIS — I10 Essential (primary) hypertension: Secondary | ICD-10-CM | POA: Diagnosis not present

## 2016-09-01 DIAGNOSIS — M6281 Muscle weakness (generalized): Secondary | ICD-10-CM | POA: Diagnosis not present

## 2016-09-01 DIAGNOSIS — E669 Obesity, unspecified: Secondary | ICD-10-CM | POA: Diagnosis not present

## 2016-09-01 DIAGNOSIS — I10 Essential (primary) hypertension: Secondary | ICD-10-CM | POA: Diagnosis not present

## 2016-09-01 DIAGNOSIS — Z471 Aftercare following joint replacement surgery: Secondary | ICD-10-CM | POA: Diagnosis not present

## 2016-09-01 DIAGNOSIS — Z6841 Body Mass Index (BMI) 40.0 and over, adult: Secondary | ICD-10-CM | POA: Diagnosis not present

## 2016-09-01 DIAGNOSIS — Z96652 Presence of left artificial knee joint: Secondary | ICD-10-CM | POA: Diagnosis not present

## 2016-09-03 DIAGNOSIS — M6281 Muscle weakness (generalized): Secondary | ICD-10-CM | POA: Diagnosis not present

## 2016-09-03 DIAGNOSIS — Z471 Aftercare following joint replacement surgery: Secondary | ICD-10-CM | POA: Diagnosis not present

## 2016-09-03 DIAGNOSIS — Z6841 Body Mass Index (BMI) 40.0 and over, adult: Secondary | ICD-10-CM | POA: Diagnosis not present

## 2016-09-03 DIAGNOSIS — Z96652 Presence of left artificial knee joint: Secondary | ICD-10-CM | POA: Diagnosis not present

## 2016-09-03 DIAGNOSIS — E669 Obesity, unspecified: Secondary | ICD-10-CM | POA: Diagnosis not present

## 2016-09-03 DIAGNOSIS — I10 Essential (primary) hypertension: Secondary | ICD-10-CM | POA: Diagnosis not present

## 2016-09-05 DIAGNOSIS — Z96652 Presence of left artificial knee joint: Secondary | ICD-10-CM | POA: Diagnosis not present

## 2016-09-05 DIAGNOSIS — M6281 Muscle weakness (generalized): Secondary | ICD-10-CM | POA: Diagnosis not present

## 2016-09-05 DIAGNOSIS — I10 Essential (primary) hypertension: Secondary | ICD-10-CM | POA: Diagnosis not present

## 2016-09-05 DIAGNOSIS — Z471 Aftercare following joint replacement surgery: Secondary | ICD-10-CM | POA: Diagnosis not present

## 2016-09-05 DIAGNOSIS — Z6841 Body Mass Index (BMI) 40.0 and over, adult: Secondary | ICD-10-CM | POA: Diagnosis not present

## 2016-09-05 DIAGNOSIS — E669 Obesity, unspecified: Secondary | ICD-10-CM | POA: Diagnosis not present

## 2016-09-08 ENCOUNTER — Other Ambulatory Visit: Payer: Self-pay | Admitting: Physician Assistant

## 2016-09-08 DIAGNOSIS — M1712 Unilateral primary osteoarthritis, left knee: Secondary | ICD-10-CM

## 2016-09-08 DIAGNOSIS — Z96652 Presence of left artificial knee joint: Secondary | ICD-10-CM

## 2016-09-10 DIAGNOSIS — Z6841 Body Mass Index (BMI) 40.0 and over, adult: Secondary | ICD-10-CM | POA: Diagnosis not present

## 2016-09-10 DIAGNOSIS — Z471 Aftercare following joint replacement surgery: Secondary | ICD-10-CM | POA: Diagnosis not present

## 2016-09-10 DIAGNOSIS — E669 Obesity, unspecified: Secondary | ICD-10-CM | POA: Diagnosis not present

## 2016-09-10 DIAGNOSIS — M6281 Muscle weakness (generalized): Secondary | ICD-10-CM | POA: Diagnosis not present

## 2016-09-10 DIAGNOSIS — I10 Essential (primary) hypertension: Secondary | ICD-10-CM | POA: Diagnosis not present

## 2016-09-10 DIAGNOSIS — Z96652 Presence of left artificial knee joint: Secondary | ICD-10-CM | POA: Diagnosis not present

## 2016-09-15 ENCOUNTER — Ambulatory Visit: Payer: BLUE CROSS/BLUE SHIELD | Attending: Orthopedic Surgery | Admitting: Physical Therapy

## 2016-09-15 DIAGNOSIS — R262 Difficulty in walking, not elsewhere classified: Secondary | ICD-10-CM | POA: Diagnosis not present

## 2016-09-15 DIAGNOSIS — M25662 Stiffness of left knee, not elsewhere classified: Secondary | ICD-10-CM | POA: Diagnosis not present

## 2016-09-15 DIAGNOSIS — R2689 Other abnormalities of gait and mobility: Secondary | ICD-10-CM | POA: Insufficient documentation

## 2016-09-15 DIAGNOSIS — M25562 Pain in left knee: Secondary | ICD-10-CM | POA: Insufficient documentation

## 2016-09-15 NOTE — Therapy (Signed)
[facility] [address]  [address] [place], [place], [address] Phone: [phone]   Fax:  [phone]  Physical Therapy Evaluation  Patient Details  Name: [name] MRN: [id number] Date of Birth: Nov 18, 1965 Referring Provider: Dr. Elsie Saas  Encounter Date: 09/15/2016      PT End of Session - 09/15/16 1455    Visit Number 1   Number of Visits 12   Date for PT Re-Evaluation 10/27/16   PT Start Time 1400   PT Stop Time 1459   PT Time Calculation (min) 59 min   Activity Tolerance Patient tolerated treatment well   Behavior During Therapy [facility] for tasks assessed/performed      Past Medical History:  Diagnosis Date  . Arthritis   . 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 Hypertension   . Obesity    S/P lap band 2010    Past Surgical History:  Procedure Laterality Date  . ABDOMINAL HYSTERECTOMY     s/p endometrial adenocarcinoma  . LAPAROSCOPIC GASTRIC BANDING  04/17/2009  . TOTAL KNEE ARTHROPLASTY Left 08/25/2016   Procedure: TOTAL KNEE ARTHROPLASTY;  Surgeon: Elsie Saas, MD;  Location: [place];  Service: Orthopedics;  Laterality: Left;    There were no vitals filed for this visit.       Subjective Assessment - 09/15/16 1401    Subjective Patient s/p L TKA on 11/27. Patient continues to have some levels of pain, continuing to take prescribed pain medications and trying to wean off. Has had HHPT with approx 4 visits, feels like she of progressing well. Several years ago had a "bad fall" onto L knee - tried conservative measures prior to TKA. Daily pain average of 4/10. Max pain 8/10 after increased activity. Patient has been icing frequently - 2x/day. Uses "foam" to keep leg straight.    Limitations Sitting;Standing;Walking   How long can you sit comfortably? 1 hour   How long can you stand comfortably? 30-45 minutes   How long can you walk comfortably? 30 minutes   Patient Stated Goals wants to get  back to regular life, water zumba   Currently in Pain? Yes   Pain Score 3    Pain Location Knee   Pain Orientation Left   Pain Descriptors / Indicators Tightness   Pain Type Acute pain;Surgical pain   Pain Onset 1 to 4 weeks ago            [facility] PT Assessment - 09/15/16 1415      Assessment   Medical Diagnosis s/p L TKA   Referring Provider Dr. Elsie Saas   Onset Date/Surgical Date 08/25/16   Next MD Visit 10/08/15  last appt on 09/08/16   Prior Therapy no     Balance Screen   Has the patient fallen in the past 6 months No   Has the patient had a decrease in activity level because of a fear of falling?  No   Is the patient reluctant to leave their home because of a fear of falling?  No     Home Environment   Living Environment Private residence   Living Arrangements Alone   Available Help at Discharge --  had sister at time of d/c from hospital   Type of [place] to enter   Entrance Stairs-Number of Steps 1   [place] Two level;Able to live on main level with bedroom/bathroom   Home Equipment Cane - quad  Prior Function   Level of Independence Independent   Vocation Full time employment   Chief of Staff - desk job   Leisure water zumba, exercise, reading     Cognition   Overall Cognitive Status Within Functional Limits for tasks assessed     Observation/Other Assessments   Focus on Therapeutic Outcomes (FOTO)  Knee: 46 (54% limited, predicted 35% limited)     ROM / Strength   AROM / PROM / Strength AROM;PROM;Strength     AROM   AROM Assessment Site Knee   Right/Left Knee Right;Left   Right Knee Extension -5   Right Knee Flexion 127   Left Knee Extension 2   Left Knee Flexion 94     PROM   PROM Assessment Site Knee   Right/Left Knee Right;Left   Left Knee Extension 0   Left Knee Flexion 102     Strength   Strength Assessment Site Hip;Knee   Right/Left Hip Right;Left   Right Hip Flexion  4+/5   Left Hip Flexion 3/5   Right/Left Knee Right;Left   Right Knee Flexion 5/5   Right Knee Extension 5/5   Left Knee Flexion 3+/5   Left Knee Extension 3+/5     Ambulation/Gait   Ambulation/Gait Yes   Ambulation/Gait Assistance 6: Modified independent (Device/Increase time)   Ambulation Distance (Feet) 100 Feet   Assistive device Small based quad cane   Gait Pattern Decreased stance time - left;Decreased step length - right;Decreased weight shift to left;Decreased hip/knee flexion - left   Ambulation Surface Level;Indoor   Gait Comments antalgic vc reduced ROM                   [facility] Adult PT Treatment/Exercise - 09/15/16 1415      Modalities   Modalities Vasopneumatic     Vasopneumatic   Number Minutes Vasopneumatic  15 minutes   Vasopnuematic Location  Knee   Vasopneumatic Pressure Medium   Vasopneumatic Temperature  coldest temp                PT Education - 09/15/16 1727    Education provided Yes   Education Details exam findings, POC, HEP   Person(s) Educated Patient   Methods Explanation;Demonstration;Handout   Comprehension Verbalized understanding;Returned demonstration          PT Short Term Goals - 09/15/16 1735      PT SHORT TERM GOAL #1   Title Patient to improve L Knee AROM equal to that of R knee (10/06/16)   Status New           PT Long Term Goals - 09/15/16 1734      PT LONG TERM GOAL #1   Title patient to be independent with HEP (10/27/16)   Status New     PT LONG TERM GOAL #2   Title Patient to improve L knee AROM equal to that of R knee (10/27/16)   Status New     PT LONG TERM GOAL #3   Title Patient to demonstrate L SLR with no extensor lag demonstrating improved quad control (10/27/16)   Status New     PT LONG TERM GOAL #4   Title Patient to demonstrate proper gait mechanics with good heel toe gait pattern with no evidence of quad deficit (10/27/16)   Status New               Plan - 09/15/16 1456     Clinical Impression Statement Patient is a  50 y/o female presenting to [facility] today for low complexity eval s/p L TKA on 08/25/16. Patient today ambulating with small based quad cane with slightly antalgic gait pattern vs limp due to reduced ROM and quad control. Patient has been seen by HHPT for approx 4 visits with good success thus far. Patient today with AROM of L knee at 2 degress from neutral and up to 94 degrees of flexion. Patient with some reduced quad control as well as end range quad strength needed for functional gait with proper heel toe gait mechanics. Patient to benefit form skilled PT intervention to address the above lsited deficits to improve gait and mobility ot maximize function and allow for return to work and leisure activities.    Rehab Potential Excellent   PT Frequency 2x / week   PT Duration 6 weeks   PT Treatment/Interventions ADLs/Self Care Home Management;Cryotherapy;Electrical Stimulation;Moist Heat;Ultrasound;Neuromuscular re-education;Balance training;Therapeutic exercise;Therapeutic activities;Functional mobility training;Stair training;Gait training;Patient/family education;Manual techniques;Passive range of motion;Vasopneumatic Device;Taping   Consulted and Agree with Plan of Care Patient      Patient will benefit from skilled therapeutic intervention in order to improve the following deficits and impairments:  Abnormal gait, Decreased activity tolerance, Decreased balance, Decreased range of motion, Decreased mobility, Decreased strength, Difficulty walking, Pain, Increased edema  Visit Diagnosis: Acute pain of left knee - Plan: PT plan of care cert/re-cert  Stiffness of left knee, not elsewhere classified - Plan: PT plan of care cert/re-cert  Difficulty in walking, not elsewhere classified - Plan: PT plan of care cert/re-cert  Other abnormalities of gait and mobility - Plan: PT plan of care cert/re-cert     Problem List Patient Active Problem List    Diagnosis Date Noted  . Primary localized osteoarthritis of left knee 08/25/2016  . Primary osteoarthritis of left knee 08/08/2016  . History of endometrial cancer 08/08/2016  . Pain in joint, multiple sites 05/05/2012  . Hypertension   . Obesity   . Endometrial adenocarcinoma ([place]) 06/29/2006      Lanney Gins, PT, DPT 09/15/16 5:42 PM    [facility] [address]  [place], [place], [address] Phone: [phone]   Fax:  [phone]  Name: JERIN ROWEN MRN: [id number] Date of Birth: 08-Jul-1966

## 2016-09-15 NOTE — Patient Instructions (Signed)
Quad Set    Slowly tighten muscles on thigh of straight leg while counting out loud to _5___.  Repeat __15__ times. Do ___2-3_ sessions per day.    Straight Leg Raise    Bend one leg. Raise other leg __approx 6__ inches with knee locked. Exhale and tighten thigh muscles while raising leg. Repeat with other leg. Repeat _15__ times. Do __2-3__ sessions per day.     Long [employer]    Straighten operated leg and try to hold it ____ seconds. Use __0__ lbs on ankle. Repeat __15__ times. Do __2-3__ sessions a day.   Bridge    Lie back, legs bent. Inhale, pressing hips up. Keeping ribs in, lengthen lower back. Exhale, rolling down along spine from top. Repeat __15__ times. Do __2-3__ sessions per day.

## 2016-09-18 ENCOUNTER — Ambulatory Visit: Payer: BLUE CROSS/BLUE SHIELD | Admitting: Physical Therapy

## 2016-09-18 DIAGNOSIS — M25662 Stiffness of left knee, not elsewhere classified: Secondary | ICD-10-CM

## 2016-09-18 DIAGNOSIS — M25562 Pain in left knee: Secondary | ICD-10-CM

## 2016-09-18 DIAGNOSIS — R262 Difficulty in walking, not elsewhere classified: Secondary | ICD-10-CM

## 2016-09-18 DIAGNOSIS — R2689 Other abnormalities of gait and mobility: Secondary | ICD-10-CM | POA: Diagnosis not present

## 2016-09-18 NOTE — Therapy (Signed)
[facility] [address]  [place], [place], [address] Phone: [phone]   Fax:  [phone]  Physical Therapy Treatment  Patient Details  Name: [name] MRN: [id number] Date of Birth: 1966-01-26 Referring Provider: Dr. Elsie Saas  Encounter Date: 09/18/2016      PT End of Session - 09/18/16 1013    Visit Number 2   Number of Visits 12   Date for PT Re-Evaluation 10/27/16   PT Start Time 1011   PT Stop Time 1110   PT Time Calculation (min) 59 min   Activity Tolerance Patient tolerated treatment well   Behavior During Therapy [facility] for tasks assessed/performed      Past Medical History:  Diagnosis Date  . Arthritis   . Endometrial adenocarcinoma ([place]) 06/2006  . Hypertension   . Obesity    S/P lap band 2010    Past Surgical History:  Procedure Laterality Date  . ABDOMINAL HYSTERECTOMY     s/p endometrial adenocarcinoma  . LAPAROSCOPIC GASTRIC BANDING  04/17/2009  . TOTAL KNEE ARTHROPLASTY Left 08/25/2016   Procedure: TOTAL KNEE ARTHROPLASTY;  Surgeon: Elsie Saas, MD;  Location: [place];  Service: Orthopedics;  Laterality: Left;    There were no vitals filed for this visit.      Subjective Assessment - 09/18/16 1012    Subjective Patient with good compliance with HEP.    Patient Stated Goals wants to get back to regular life, water zumba   Currently in Pain? Yes   Pain Score 2    Pain Location Knee   Pain Orientation Left   Pain Descriptors / Indicators Tightness   Pain Type Acute pain;Surgical pain   Pain Onset 1 to 4 weeks ago                         [facility] Adult PT Treatment/Exercise - 09/18/16 1015      Exercises   Exercises Knee/Hip     Knee/Hip Exercises: Aerobic   Nustep level 4 x 8 minutes for ROM     Knee/Hip Exercises: Standing   Heel Raises 15 reps   Functional Squat 15 reps   Functional Squat Limitations UE support at counter     Knee/Hip  Exercises: Seated   Long Arc Quad Strengthening;Left;15 reps   Long Arc Quad Limitations 15 reps no weight; 15 reps 2#   Other Seated Knee/Hip Exercises fitter - L LE 1 blue/1 black band x 15 reps   Hamstring Curl Strengthening;Left;15 reps   Hamstring Limitations red tband     Knee/Hip Exercises: Supine   Quad Sets Strengthening;Left;15 reps   Quad Sets Limitations 5 second hold with foot propped on 1/2 foam roll   Short Arc Quad Sets Strengthening;Left;15 reps   Bridges Strengthening;Both;15 reps   Straight Leg Raises Strengthening;Left;15 reps   Knee Flexion AAROM;Left;15 reps   Knee Flexion Limitations on peanut ball x 15 with 5" hold for stretch     Modalities   Modalities Vasopneumatic     Vasopneumatic   Number Minutes Vasopneumatic  15 minutes   Vasopnuematic Location  Knee   Vasopneumatic Pressure High   Vasopneumatic Temperature  coldest temp                  PT Short Term Goals - 09/15/16 1735      PT SHORT TERM GOAL #1   Title Patient to improve L Knee AROM equal to that  of R knee (10/06/16)   Status New           PT Long Term Goals - 09/15/16 1734      PT LONG TERM GOAL #1   Title patient to be independent with HEP (10/27/16)   Status New     PT LONG TERM GOAL #2   Title Patient to improve L knee AROM equal to that of R knee (10/27/16)   Status New     PT LONG TERM GOAL #3   Title Patient to demonstrate L SLR with no extensor lag demonstrating improved quad control (10/27/16)   Status New     PT LONG TERM GOAL #4   Title Patient to demonstrate proper gait mechanics with good heel toe gait pattern with no evidence of quad deficit (10/27/16)   Status New               Plan - 09/18/16 1014    Clinical Impression Statement Patient doing well today with all knee strengthening tasks. Patient improving quad contorl with SAQ and LAQ, however does continue to demonstrate slight extensor lag with SLR. Patient continuing to wlak with small base  quad cane with very antalgic gait pattern without assistive device. Patient to continue to benefit from PT to maximize function.    PT Treatment/Interventions ADLs/Self Care Home Management;Cryotherapy;Electrical Stimulation;Moist Heat;Ultrasound;Neuromuscular re-education;Balance training;Therapeutic exercise;Therapeutic activities;Functional mobility training;Stair training;Gait training;Patient/family education;Manual techniques;Passive range of motion;Vasopneumatic Device;Taping   Consulted and Agree with Plan of Care Patient      Patient will benefit from skilled therapeutic intervention in order to improve the following deficits and impairments:  Abnormal gait, Decreased activity tolerance, Decreased balance, Decreased range of motion, Decreased mobility, Decreased strength, Difficulty walking, Pain, Increased edema  Visit Diagnosis: Acute pain of left knee  Stiffness of left knee, not elsewhere classified  Difficulty in walking, not elsewhere classified  Other abnormalities of gait and mobility     Problem List Patient Active Problem List   Diagnosis Date Noted  . Primary localized osteoarthritis of left knee 08/25/2016  . Primary osteoarthritis of left knee 08/08/2016  . History of endometrial cancer 08/08/2016  . Pain in joint, multiple sites 05/05/2012  . Hypertension   . Obesity   . Endometrial adenocarcinoma ([place]) 06/29/2006      Lanney Gins, PT, DPT 09/18/16 11:43 AM    [facility] [address]  [name], [place], [address] Phone: [phone]   Fax:  [phone]  Name: [name] MRN: [id number] Date of Birth: [birth year]

## 2016-09-23 ENCOUNTER — Ambulatory Visit: Payer: BLUE CROSS/BLUE SHIELD | Admitting: Physical Therapy

## 2016-09-23 DIAGNOSIS — M25662 Stiffness of left knee, not elsewhere classified: Secondary | ICD-10-CM

## 2016-09-23 DIAGNOSIS — R2689 Other abnormalities of gait and mobility: Secondary | ICD-10-CM | POA: Diagnosis not present

## 2016-09-23 DIAGNOSIS — R262 Difficulty in walking, not elsewhere classified: Secondary | ICD-10-CM

## 2016-09-23 DIAGNOSIS — M25562 Pain in left knee: Secondary | ICD-10-CM | POA: Diagnosis not present

## 2016-09-23 NOTE — Therapy (Signed)
[facility] [address]  [place], [place], [address] Phone: [phone]   Fax:  [phone]  Physical Therapy Treatment  Patient Details  Name: [name] MRN: [id number] Date of Birth: 24-Nov-1965 Referring Provider: Dr. Elsie Saas  Encounter Date: 09/23/2016      PT End of Session - 09/23/16 0955    Visit Number 3   Number of Visits 12   Date for PT Re-Evaluation 10/27/16   PT Start Time 0954   PT Stop Time 1100   PT Time Calculation (min) 66 min   Activity Tolerance Patient tolerated treatment well   Behavior During Therapy [facility] for tasks assessed/performed      Past Medical History:  Diagnosis Date  . Arthritis   . Endometrial adenocarcinoma ([place]) 06/2006  . Hypertension   . Obesity    S/P lap band 2010    Past Surgical History:  Procedure Laterality Date  . ABDOMINAL HYSTERECTOMY     s/p endometrial adenocarcinoma  . LAPAROSCOPIC GASTRIC BANDING  04/17/2009  . TOTAL KNEE ARTHROPLASTY Left 08/25/2016   Procedure: TOTAL KNEE ARTHROPLASTY;  Surgeon: Elsie Saas, MD;  Location: [place];  Service: Orthopedics;  Laterality: Left;    There were no vitals filed for this visit.      Subjective Assessment - 09/23/16 0954    Subjective Patient doing well - trying to walk with a more controlled gait pattern   Patient Stated Goals wants to get back to regular life, water zumba   Currently in Pain? Yes   Pain Score 3    Pain Location Knee   Pain Orientation Left   Pain Descriptors / Indicators Tightness;Aching   Pain Type Acute pain;Surgical pain   Pain Onset 1 to 4 weeks ago            [facility] PT Assessment - 09/23/16 0001      AROM   Left Knee Extension 0   Left Knee Flexion 98                     [facility] Adult PT Treatment/Exercise - 09/23/16 0001      Ambulation/Gait   Ambulation/Gait Yes   Ambulation/Gait Assistance 6: Modified independent (Device/Increase time)   Ambulation Distance (Feet) 150 Feet   Assistive device None   Gait Pattern Decreased stance time - left;Decreased step length - right;Decreased weight shift to left;Decreased hip/knee flexion - left   Ambulation Surface Level;Indoor     Knee/Hip Exercises: Aerobic   Stationary Bike for ROM x 8 minutes - 5 second holds for stretch     Knee/Hip Exercises: Standing   Step Down 10 reps;Step Height: 4"   Step Down Limitations eccentric - B UE support   Functional Squat 15 reps   Functional Squat Limitations TRX     Knee/Hip Exercises: Seated   Long Arc Quad Strengthening;Left;2 sets;15 reps;Weights   Long Arc Quad Weight 3 lbs.   Other Seated Knee/Hip Exercises fitter - L LE 1 blue/1 black band x 15 reps   Hamstring Curl Strengthening;Left;15 reps   Hamstring Limitations green tband; 3# ankle weight     Knee/Hip Exercises: Supine   Short Arc Quad Sets Strengthening;Left;15 reps   Short Arc Quad Sets Limitations 3#   Bridges Strengthening;Both;15 reps   Bridges Limitations 5 second hold at top   Straight Leg Raises Strengthening;Left;2 sets;15 reps   Straight Leg Raises Limitations 2#   Knee Flexion AAROM;Left;15 reps  Knee Flexion Limitations on peanut ball x 15 with 5" hold for stretch   Other Supine Knee/Hip Exercises Straight leg bridge with B LE extended on peanut ball x  10 with 5 second hold     Modalities   Modalities Vasopneumatic     Vasopneumatic   Number Minutes Vasopneumatic  15 minutes   Vasopnuematic Location  Knee  Left   Vasopneumatic Pressure High   Vasopneumatic Temperature  coldest temp                  PT Short Term Goals - 09/23/16 1107      PT SHORT TERM GOAL #1   Title Patient to improve L Knee AROM equal to that of R knee (10/06/16)   Status On-going           PT Long Term Goals - 09/23/16 1108      PT LONG TERM GOAL #1   Title patient to be independent with HEP (10/27/16)   Status On-going     PT LONG TERM GOAL #2   Title  Patient to improve L knee AROM equal to that of R knee (10/27/16)   Status On-going     PT LONG TERM GOAL #3   Title Patient to demonstrate L SLR with no extensor lag demonstrating improved quad control (10/27/16)   Status On-going     PT LONG TERM GOAL #4   Title Patient to demonstrate proper gait mechanics with good heel toe gait pattern with no evidence of quad deficit (10/27/16)   Status On-going               Plan - 09/23/16 0955    Clinical Impression Statement Patient ambulating today with improved gait pattern, however continues to lack end range quad strength needed for full knee extension during heel strike to mid-stance. Patient improving all L LE strengthening today, with ability to start incorporating eccentric quad control tasks - some difficulty and will require continued work with this. Patient to conitnue to benefit from PT to maximize function and return to normal gait mechanics.    PT Treatment/Interventions ADLs/Self Care Home Management;Cryotherapy;Electrical Stimulation;Moist Heat;Ultrasound;Neuromuscular re-education;Balance training;Therapeutic exercise;Therapeutic activities;Functional mobility training;Stair training;Gait training;Patient/family education;Manual techniques;Passive range of motion;Vasopneumatic Device;Taping   PT Next Visit Plan progress L knee ROM/ strength   Consulted and Agree with Plan of Care Patient      Patient will benefit from skilled therapeutic intervention in order to improve the following deficits and impairments:  Abnormal gait, Decreased activity tolerance, Decreased balance, Decreased range of motion, Decreased mobility, Decreased strength, Difficulty walking, Pain, Increased edema  Visit Diagnosis: Acute pain of left knee  Stiffness of left knee, not elsewhere classified  Difficulty in walking, not elsewhere classified  Other abnormalities of gait and mobility     Problem List Patient Active Problem List   Diagnosis  Date Noted  . Primary localized osteoarthritis of left knee 08/25/2016  . Primary osteoarthritis of left knee 08/08/2016  . History of endometrial cancer 08/08/2016  . Pain in joint, multiple sites 05/05/2012  . Hypertension   . Obesity   . Endometrial adenocarcinoma ([place]) 06/29/2006     Lanney Gins, PT, DPT 09/23/16 11:08 AM   [facility] [address]  [place], [place], [address] Phone: [phone]   Fax:  [phone]  Name: HEIDIE KELLEN MRN: [id number] Date of Birth: 07-31-1966

## 2016-09-26 ENCOUNTER — Ambulatory Visit: Payer: BLUE CROSS/BLUE SHIELD | Admitting: Physical Therapy

## 2016-09-26 DIAGNOSIS — R262 Difficulty in walking, not elsewhere classified: Secondary | ICD-10-CM

## 2016-09-26 DIAGNOSIS — M25662 Stiffness of left knee, not elsewhere classified: Secondary | ICD-10-CM | POA: Diagnosis not present

## 2016-09-26 DIAGNOSIS — M25562 Pain in left knee: Secondary | ICD-10-CM | POA: Diagnosis not present

## 2016-09-26 DIAGNOSIS — R2689 Other abnormalities of gait and mobility: Secondary | ICD-10-CM

## 2016-09-26 NOTE — Therapy (Signed)
[facility] [address]  [place], [place], [address] Phone: [phone]   Fax:  [phone]  Physical Therapy Treatment  Patient Details  Name: [name] MRN: [id number] Date of Birth: June 24, 1966 Referring Provider: Dr. Elsie Saas  Encounter Date: 09/26/2016      PT End of Session - 09/26/16 1035    Visit Number 4   Number of Visits 12   Date for PT Re-Evaluation 10/27/16   PT Start Time [id number]   PT Stop Time 1118   PT Time Calculation (min) 63 min   Activity Tolerance Patient tolerated treatment well   Behavior During Therapy [facility] for tasks assessed/performed      Past Medical History:  Diagnosis Date  . Arthritis   . Endometrial adenocarcinoma ([place]) 06/2006  . Hypertension   . Obesity    S/P lap band 2010    Past Surgical History:  Procedure Laterality Date  . ABDOMINAL HYSTERECTOMY     s/p endometrial adenocarcinoma  . LAPAROSCOPIC GASTRIC BANDING  04/17/2009  . TOTAL KNEE ARTHROPLASTY Left 08/25/2016   Procedure: TOTAL KNEE ARTHROPLASTY;  Surgeon: Elsie Saas, MD;  Location: [place];  Service: Orthopedics;  Laterality: Left;    There were no vitals filed for this visit.      Subjective Assessment - 09/26/16 1034    Subjective patient with good HEP compliance - walking at home without cane   Patient Stated Goals wants to get back to regular life, water zumba   Currently in Pain? Yes   Pain Score 2    Pain Location Knee   Pain Orientation Left   Pain Descriptors / Indicators Aching;Tightness   Pain Type Acute pain;Surgical pain   Pain Onset 1 to 4 weeks ago            [facility] PT Assessment - 09/26/16 0001      AROM   Left Knee Extension 0   Left Knee Flexion 98     PROM   Left Knee Extension 0   Left Knee Flexion 105                     [facility] Adult PT Treatment/Exercise - 09/26/16 0001      Knee/Hip Exercises: Aerobic   Stationary Bike for ROM x 8 minutes  - partial revolutions; full revoultions backwards with toe pointed towards floor     Knee/Hip Exercises: Standing   Forward Step Up Left;10 reps;Hand Hold: 2;Step Height: 4"   Forward Step Up Limitations working on quad control - with eccentric focus   Functional Squat 15 reps   Functional Squat Limitations TRX     Knee/Hip Exercises: Seated   Long Arc Quad Strengthening;Left;15 reps;Weights   Long Arc Quad Weight 3 lbs.   Other Seated Knee/Hip Exercises fitter - L LE 1 blue/1 black band x 15 reps     Knee/Hip Exercises: Supine   Quad Sets Strengthening;Left;15 reps   Quad Sets Limitations 5 second hold - with foot on 1/2 foam roll   Short Arc Quad Sets Strengthening;Left;15 reps   Short Arc Quad Sets Limitations 3#   Bridges Strengthening;Both;15 reps   Bridges Limitations 5 second hold at top   Straight Leg Raises Strengthening;Left;15 reps   Knee Flexion AAROM;Left;15 reps   Knee Flexion Limitations on peanut ball x 15 with 5" hold for stretch   Other Supine Knee/Hip Exercises Straight leg bridge with B LE extended on peanut ball x  15 with 5 second hold     Modalities   Modalities Vasopneumatic     Vasopneumatic   Number Minutes Vasopneumatic  15 minutes   Vasopnuematic Location  Knee  Left   Vasopneumatic Pressure High   Vasopneumatic Temperature  coldest temp     Manual Therapy   Manual Therapy Soft tissue mobilization;Joint mobilization   Joint Mobilization patellar mobilizations - all directions - good movement   Soft tissue mobilization scar massage - L knee with freeup                  PT Short Term Goals - 09/23/16 1107      PT SHORT TERM GOAL #1   Title Patient to improve L Knee AROM equal to that of R knee (10/06/16)   Status On-going           PT Long Term Goals - 09/23/16 1108      PT LONG TERM GOAL #1   Title patient to be independent with HEP (10/27/16)   Status On-going     PT LONG TERM GOAL #2   Title Patient to improve L knee AROM  equal to that of R knee (10/27/16)   Status On-going     PT LONG TERM GOAL #3   Title Patient to demonstrate L SLR with no extensor lag demonstrating improved quad control (10/27/16)   Status On-going     PT LONG TERM GOAL #4   Title Patient to demonstrate proper gait mechanics with good heel toe gait pattern with no evidence of quad deficit (10/27/16)   Status On-going               Plan - 09/26/16 1035    Clinical Impression Statement Patient doing well today with all strengthening and stetching tasks. Scar massage and patellar mobilization introduced today with patient tolerating with no pain - direction to begin scar massage at home to promote improved ROM. Initiation of step up/down activity today with poor quad control requiring compensation by R LE as well as reduced eccentric control for down phase. Patient to continue to work on this to promote improved eccentric control of L quad for improved functional mobility.    PT Treatment/Interventions ADLs/Self Care Home Management;Cryotherapy;Electrical Stimulation;Moist Heat;Ultrasound;Neuromuscular re-education;Balance training;Therapeutic exercise;Therapeutic activities;Functional mobility training;Stair training;Gait training;Patient/family education;Manual techniques;Passive range of motion;Vasopneumatic Device;Taping   PT Next Visit Plan progress L knee ROM/ strength   Consulted and Agree with Plan of Care Patient      Patient will benefit from skilled therapeutic intervention in order to improve the following deficits and impairments:  Abnormal gait, Decreased activity tolerance, Decreased balance, Decreased range of motion, Decreased mobility, Decreased strength, Difficulty walking, Pain, Increased edema  Visit Diagnosis: Acute pain of left knee  Stiffness of left knee, not elsewhere classified  Difficulty in walking, not elsewhere classified  Other abnormalities of gait and mobility     Problem List Patient Active  Problem List   Diagnosis Date Noted  . Primary localized osteoarthritis of left knee 08/25/2016  . Primary osteoarthritis of left knee 08/08/2016  . History of endometrial cancer 08/08/2016  . Pain in joint, multiple sites 05/05/2012  . Hypertension   . Obesity   . Endometrial adenocarcinoma ([place]) 06/29/2006      Lanney Gins, PT, DPT 09/26/16 11:22 AM   [facility] [address]  [place], [place], [address] Phone: [phone]   Fax:  [phone]  Name: Kristin  ARLIE [name] MRN: [id number] Date of Birth: 09-Jul-1966

## 2016-09-30 ENCOUNTER — Ambulatory Visit: Payer: BLUE CROSS/BLUE SHIELD | Attending: Orthopedic Surgery | Admitting: Physical Therapy

## 2016-09-30 DIAGNOSIS — R262 Difficulty in walking, not elsewhere classified: Secondary | ICD-10-CM | POA: Diagnosis not present

## 2016-09-30 DIAGNOSIS — R2689 Other abnormalities of gait and mobility: Secondary | ICD-10-CM | POA: Diagnosis not present

## 2016-09-30 DIAGNOSIS — M25662 Stiffness of left knee, not elsewhere classified: Secondary | ICD-10-CM | POA: Insufficient documentation

## 2016-09-30 DIAGNOSIS — M25562 Pain in left knee: Secondary | ICD-10-CM | POA: Diagnosis not present

## 2016-09-30 NOTE — Therapy (Signed)
[facility] [address]  [place], [place], [address] Phone: [phone]   Fax:  [phone]  Physical Therapy Treatment  Patient Details  Name: [name] MRN: [id number] Date of Birth: November 20, 1965 Referring Provider: Dr. Elsie Saas  Encounter Date: 09/30/2016      PT End of Session - 09/30/16 1022    Visit Number 5   Number of Visits 12   Date for PT Re-Evaluation 10/27/16   PT Start Time 1018   PT Stop Time 1121   PT Time Calculation (min) 63 min   Activity Tolerance Patient tolerated treatment well   Behavior During Therapy [facility] for tasks assessed/performed      Past Medical History:  Diagnosis Date  . Arthritis   . Endometrial adenocarcinoma ([place]) 06/2006  . Hypertension   . Obesity    S/P lap band 2010    Past Surgical History:  Procedure Laterality Date  . ABDOMINAL HYSTERECTOMY     s/p endometrial adenocarcinoma  . LAPAROSCOPIC GASTRIC BANDING  04/17/2009  . TOTAL KNEE ARTHROPLASTY Left 08/25/2016   Procedure: TOTAL KNEE ARTHROPLASTY;  Surgeon: Elsie Saas, MD;  Location: [place];  Service: Orthopedics;  Laterality: Left;    There were no vitals filed for this visit.      Subjective Assessment - 09/30/16 1022    Subjective patient doing well - was able to go to church this weekend.    Patient Stated Goals wants to get back to regular life, water zumba   Currently in Pain? Yes   Pain Score 2    Pain Location Knee   Pain Orientation Left   Pain Descriptors / Indicators Aching;Tightness   Pain Type Acute pain;Surgical pain   Pain Onset 1 to 4 weeks ago            [facility] PT Assessment - 09/30/16 0001      AROM   Left Knee Extension -1   Left Knee Flexion 99                     [facility] Adult PT Treatment/Exercise - 09/30/16 1023      Knee/Hip Exercises: Stretches   Gastroc Stretch Left;3 reps;30 seconds   Gastroc Stretch Limitations blue rocker     Knee/Hip  Exercises: Aerobic   Stationary Bike x 8 minutes for ROM - full revolutions with some compensations     Knee/Hip Exercises: Machines for Strengthening   Cybex Knee Extension 20# x 15 - 2 up/1down for eccentric control of L LE     Knee/Hip Exercises: Standing   Functional Squat 15 reps   Functional Squat Limitations TRX   SLS L LE -  3 x 30 seconds - light UE support - firm surface     Knee/Hip Exercises: Seated   Long Arc Quad Strengthening;Left;15 reps;Weights   Long Arc Quad Weight 3 lbs.     Knee/Hip Exercises: Supine   Heel Slides AAROM;Left;15 reps   Heel Slides Limitations 5-10 second hold   Bridges Strengthening;Both;15 reps   Bridges Limitations 5 second hold at top - green tband at knees   Straight Leg Raises Strengthening;Left;15 reps   Straight Leg Raises Limitations 3#     Knee/Hip Exercises: Sidelying   Hip ABduction Strengthening;Left;15 reps   Hip ABduction Limitations 3#     Modalities   Modalities Vasopneumatic     Vasopneumatic   Number Minutes Vasopneumatic  15 minutes   Vasopnuematic Location  Knee  Left   Vasopneumatic Pressure High   Vasopneumatic Temperature  coldest temp                  PT Short Term Goals - 09/30/16 1117      PT SHORT TERM GOAL #1   Title Patient to improve L Knee AROM equal to that of R knee (10/06/16)   Status On-going           PT Long Term Goals - 09/30/16 1117      PT LONG TERM GOAL #1   Title patient to be independent with HEP (10/27/16)   Status On-going     PT LONG TERM GOAL #2   Title Patient to improve L knee AROM equal to that of R knee (10/27/16)   Status On-going     PT LONG TERM GOAL #3   Title Patient to demonstrate L SLR with no extensor lag demonstrating improved quad control (10/27/16)   Status On-going     PT LONG TERM GOAL #4   Title Patient to demonstrate proper gait mechanics with good heel toe gait pattern with no evidence of quad deficit (10/27/16)   Status On-going                Plan - 09/30/16 1023    Clinical Impression Statement PT session today incorporating more eccentric strength training of L quadricep mm group with patient reporting appropriate muscle fatigue. Patient today with slight improvement in AROM of L knee with education to continue to focus on both hamstring and quad stretching to allow for full return of normal gait mechanics. Patient reporting excellent compliance with HEP as well as L knee scar massage. Patient to continue to benefit from PT to maximize function.    PT Treatment/Interventions ADLs/Self Care Home Management;Cryotherapy;Electrical Stimulation;Moist Heat;Ultrasound;Neuromuscular re-education;Balance training;Therapeutic exercise;Therapeutic activities;Functional mobility training;Stair training;Gait training;Patient/family education;Manual techniques;Passive range of motion;Vasopneumatic Device;Taping   PT Next Visit Plan progress L knee ROM/ strength   Consulted and Agree with Plan of Care Patient      Patient will benefit from skilled therapeutic intervention in order to improve the following deficits and impairments:  Abnormal gait, Decreased activity tolerance, Decreased balance, Decreased range of motion, Decreased mobility, Decreased strength, Difficulty walking, Pain, Increased edema  Visit Diagnosis: Acute pain of left knee  Stiffness of left knee, not elsewhere classified  Difficulty in walking, not elsewhere classified  Other abnormalities of gait and mobility     Problem List Patient Active Problem List   Diagnosis Date Noted  . Primary localized osteoarthritis of left knee 08/25/2016  . Primary osteoarthritis of left knee 08/08/2016  . History of endometrial cancer 08/08/2016  . Pain in joint, multiple sites 05/05/2012  . Hypertension   . Obesity   . Endometrial adenocarcinoma ([place]) 06/29/2006     Lanney Gins, PT, DPT 09/30/16 11:25 AM   [facility] [address]  [place], [place], [address] Phone: [phone]   Fax:  [phone]  Name: [name] MRN: [id number] Date of Birth: May 26, 1966

## 2016-10-03 ENCOUNTER — Ambulatory Visit: Payer: BLUE CROSS/BLUE SHIELD

## 2016-10-03 DIAGNOSIS — M25562 Pain in left knee: Secondary | ICD-10-CM | POA: Diagnosis not present

## 2016-10-03 DIAGNOSIS — M25662 Stiffness of left knee, not elsewhere classified: Secondary | ICD-10-CM | POA: Diagnosis not present

## 2016-10-03 DIAGNOSIS — R2689 Other abnormalities of gait and mobility: Secondary | ICD-10-CM | POA: Diagnosis not present

## 2016-10-03 DIAGNOSIS — R262 Difficulty in walking, not elsewhere classified: Secondary | ICD-10-CM | POA: Diagnosis not present

## 2016-10-03 NOTE — Therapy (Signed)
Dictation #1 [id number]  PTW:[id number] [facility] [place], [place], [address] Phone: [phone]   Fax:  [phone]  Physical Therapy Treatment  Patient Details  Name: [name] MRN: [id number] Date of Birth: [birth year] Referring Provider: Dr. Elsie Saas   Encounter Date: 10/03/2016      PT End of Session - 10/03/16 1110    Visit Number 6   Number of Visits 12   Date for PT Re-Evaluation 10/27/16   PT Start Time 1102   PT Stop Time 1155   PT Time Calculation (min) 53 min   Activity Tolerance Patient tolerated treatment well   Behavior During Therapy [facility] for tasks assessed/performed      Past Medical History:  Diagnosis Date  . Arthritis   . Endometrial adenocarcinoma ([place]) 06/2006  . Hypertension   . Obesity    S/P lap band 2010    Past Surgical History:  Procedure Laterality Date  . ABDOMINAL HYSTERECTOMY     s/p endometrial adenocarcinoma  . LAPAROSCOPIC GASTRIC BANDING  04/17/2009  . TOTAL KNEE ARTHROPLASTY Left 08/25/2016   Procedure: TOTAL KNEE ARTHROPLASTY;  Surgeon: Elsie Saas, MD;  Location: [facility];  Service: Orthopedics;  Laterality: Left;    There were no vitals filed for this visit.      Subjective Assessment - 10/03/16 1107    Subjective Pt. reporting she is consistently performing HEP and feels she is improving with therapy.     Patient Stated Goals wants to get back to regular life, water zumba   Currently in Pain? No/denies   Pain Score 0-No pain   Multiple Pain Sites No            [facility] PT Assessment - 10/03/16 1114      Assessment   Medical Diagnosis s/p L TKA   Referring Provider Dr. Elsie Saas    Onset Date/Surgical Date 08/25/16   Next MD Visit 10/08/15   Prior Therapy no     AROM   Left Knee Extension -1   Left Knee Flexion 103  measured in seated      PROM   PROM Assessment Site Knee   Right/Left Knee Right;Left   Left Knee  Extension 0   Left Knee Flexion 107                     [facility] Adult PT Treatment/Exercise - 10/03/16 1149      Knee/Hip Exercises: Stretches   Other Knee/Hip Stretches Lunge L knee flexion stretch on edge of treadmill 2 x 30 sec      Knee/Hip Exercises: Aerobic   Stationary Bike x 8 minutes for ROM - full revolutions with some compensations     Knee/Hip Exercises: Standing   Terminal Knee Extension Limitations L TKE with black looped TB 5" x 15 reps     Knee/Hip Exercises: Seated   Long Arc Quad Strengthening;Left;15 reps;Weights  with adduction ball squeeze and 5" hold at top   [employer] 3 lbs.     Knee/Hip Exercises: Supine   Knee Flexion AAROM;Left;15 reps   Knee Flexion Limitations on peanut ball x 15 with 5" hold for stretch  Overpressure provided from therapist with last 10 reps      Vasopneumatic   Number Minutes Vasopneumatic  10 minutes   Vasopnuematic Location  Knee  L   Vasopneumatic Pressure Medium   Vasopneumatic Temperature  coldest temp  Manual Therapy   Manual Therapy Soft tissue mobilization;Joint mobilization;Passive ROM   Joint Mobilization patellar mobilizations - all directions - good movement   Soft tissue mobilization scar massage - L knee with freeup   Passive ROM L knee flexion stretch with therapist overpressure                 PT Education - 10/03/16 1211    Education provided Yes   Education Details lunge stretch for knee, TKE with black looped TB issued to Pt.    Person(s) Educated Patient   Methods Explanation;Demonstration;Handout;Verbal cues   Comprehension Verbalized understanding;Returned demonstration;Tactile cues required;Need further instruction          PT Short Term Goals - 09/30/16 1117      PT SHORT TERM GOAL #1   Title Patient to improve L Knee AROM equal to that of R knee (10/06/16)   Status On-going           PT Long Term Goals - 10/03/16 1203      PT LONG TERM GOAL #1   Title  patient to be independent with HEP (10/27/16)   Status Partially Met  1.5.18: pt. met for current HEP however provided updated HEP 1.5.18     PT LONG TERM GOAL #2   Title Patient to improve L knee AROM equal to that of R knee (10/27/16)   Status On-going  1.5.18: pt. able to demo L knee AROM flexion of 104 dg in seated      PT LONG TERM GOAL #3   Title Patient to demonstrate L SLR with no extensor lag demonstrating improved quad control (10/27/16)   Status On-going     PT LONG TERM GOAL #4   Title Patient to demonstrate proper gait mechanics with good heel toe gait pattern with no evidence of quad deficit (10/27/16)   Status On-going               Plan - 10/03/16 1110    Clinical Impression Statement Pt. demonstrating improved L knee ROM today: L knee AROM flexion 104 dg, PROM flexion 107 dg.  HEP updated today to include L knee lunge flexion stretch on step and TKE with black TB in door.  Pt. very compliant and motivated at this point with PT reporting daily HEP adherence and plans to begin recumbent bike at local gym for ROM.  Pt. progressing well at this point.   PT Treatment/Interventions ADLs/Self Care Home Management;Cryotherapy;Electrical Stimulation;Moist Heat;Ultrasound;Neuromuscular re-education;Balance training;Therapeutic exercise;Therapeutic activities;Functional mobility training;Stair training;Gait training;Patient/family education;Manual techniques;Passive range of motion;Vasopneumatic Device;Taping   PT Next Visit Plan Test remaining goals for MD f/u on 1.8.18; progress L knee ROM/ strength      Patient will benefit from skilled therapeutic intervention in order to improve the following deficits and impairments:  Abnormal gait, Decreased activity tolerance, Decreased balance, Decreased range of motion, Decreased mobility, Decreased strength, Difficulty walking, Pain, Increased edema  Visit Diagnosis: Acute pain of left knee  Stiffness of left knee, not elsewhere  classified  Difficulty in walking, not elsewhere classified  Other abnormalities of gait and mobility     Problem List Patient Active Problem List   Diagnosis Date Noted  . Primary localized osteoarthritis of left knee 08/25/2016  . Primary osteoarthritis of left knee 08/08/2016  . History of endometrial cancer 08/08/2016  . Pain in joint, multiple sites 05/05/2012  . Hypertension   . Obesity   . Endometrial adenocarcinoma ([place]) 06/29/2006    Bess Harvest, PTA 10/03/16 12:13  PM  [facility] [address]  [place], [place], [address] Phone: [phone]   Fax:  [phone]  Name: KRYSTIANNA SOTH MRN: [id number] Date of Birth: 11-13-1965

## 2016-10-06 ENCOUNTER — Ambulatory Visit: Payer: BLUE CROSS/BLUE SHIELD | Admitting: Physical Therapy

## 2016-10-06 DIAGNOSIS — M1712 Unilateral primary osteoarthritis, left knee: Secondary | ICD-10-CM | POA: Diagnosis not present

## 2016-10-06 DIAGNOSIS — M25562 Pain in left knee: Secondary | ICD-10-CM

## 2016-10-06 DIAGNOSIS — R2689 Other abnormalities of gait and mobility: Secondary | ICD-10-CM

## 2016-10-06 DIAGNOSIS — M25662 Stiffness of left knee, not elsewhere classified: Secondary | ICD-10-CM | POA: Diagnosis not present

## 2016-10-06 DIAGNOSIS — R262 Difficulty in walking, not elsewhere classified: Secondary | ICD-10-CM | POA: Diagnosis not present

## 2016-10-06 NOTE — Therapy (Signed)
[facility] [address]  [place], [place], [address] Phone: [phone]   Fax:  [phone]  Physical Therapy Treatment  Patient Details  Name: [name] MRN: [id number] Date of Birth: Feb 06, 1966 Referring Provider: Dr. Elsie Saas   Encounter Date: 10/06/2016      PT End of Session - 10/06/16 1022    Visit Number 7   Number of Visits 12   Date for PT Re-Evaluation 10/27/16   PT Start Time 1017   PT Stop Time 1118   PT Time Calculation (min) 61 min   Activity Tolerance Patient tolerated treatment well   Behavior During Therapy [facility] for tasks assessed/performed      Past Medical History:  Diagnosis Date  . Arthritis   . 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 Hypertension   . Obesity    S/P lap band 2010    Past Surgical History:  Procedure Laterality Date  . ABDOMINAL HYSTERECTOMY     s/p endometrial adenocarcinoma  . LAPAROSCOPIC GASTRIC BANDING  04/17/2009  . TOTAL KNEE ARTHROPLASTY Left 08/25/2016   Procedure: TOTAL KNEE ARTHROPLASTY;  Surgeon: Elsie Saas, MD;  Location: [facility];  Service: Orthopedics;  Laterality: Left;    There were no vitals filed for this visit.      Subjective Assessment - 10/06/16 1020    Subjective Patient went to gym this weekend to ride bike - feels a little stiff this morning (nagging pain)   Patient Stated Goals wants to get back to regular life, water zumba   Currently in Pain? Yes   Pain Score 1    Pain Location Knee   Pain Orientation Left   Pain Descriptors / Indicators Nagging;Tightness;Sore   Pain Type Acute pain;Surgical pain   Pain Onset 1 to 4 weeks ago            [facility] PT Assessment - 10/06/16 0001      AROM   Left Knee Extension -1   Left Knee Flexion 106                     [facility] Adult PT Treatment/Exercise - 10/06/16 1033      Knee/Hip Exercises: Stretches   Quad Stretch Left;3 reps;30 seconds   Quad Stretch  Limitations prone with strap   Gastroc Stretch Left;3 reps;30 seconds   Gastroc Stretch Limitations blue rocker     Knee/Hip Exercises: Aerobic   Stationary Bike x 6 minutes for ROM - full revolutions with good ankle dorsiflexion     Knee/Hip Exercises: Standing   Terminal Knee Extension Limitations L TKE with black looped TB 5" x 15 reps - 2 sets   Step Down 10 reps;Hand Hold: 2;Step Height: 4"   Step Down Limitations eccentric - B UE support - patient fearful of knee buckling   Functional Squat 2 sets;15 reps   Functional Squat Limitations TRX     Knee/Hip Exercises: Seated   Long Arc Quad Strengthening;Left;15 reps;Weights   Long Arc Quad Weight 4 lbs.     Knee/Hip Exercises: Supine   Bridges Strengthening;Both;15 reps   Bridges Limitations 5 second hold at top   Straight Leg Raises Strengthening;Left;15 reps   Straight Leg Raises Limitations 4#     Modalities   Modalities Vasopneumatic     Vasopneumatic   Number Minutes Vasopneumatic  15 minutes   Vasopnuematic Location  Knee   Vasopneumatic Pressure High   Vasopneumatic Temperature  coldest temp  PT Short Term Goals - 10/06/16 1306      PT SHORT TERM GOAL #1   Title Patient to improve L Knee AROM equal to that of R knee (10/06/16)   Status On-going           PT Long Term Goals - 10/06/16 1307      PT LONG TERM GOAL #1   Title patient to be independent with HEP (10/27/16)   Status Partially Met  1.5.18: pt. met for current HEP however provided updated HEP 1.5.18     PT LONG TERM GOAL #2   Title Patient to improve L knee AROM equal to that of R knee (10/27/16)   Status On-going  1.5.18: pt. able to demo L knee AROM flexion of 104 dg in seated      PT LONG TERM GOAL #3   Title Patient to demonstrate L SLR with no extensor lag demonstrating improved quad control (10/27/16)   Status On-going     PT LONG TERM GOAL #4   Title Patient to demonstrate proper gait mechanics with good heel  toe gait pattern with no evidence of quad deficit (10/27/16)   Status On-going               Plan - 10/06/16 1023    Clinical Impression Statement Rosellen is doing very well with improving L knee AROM, strength, and gait mechanics. Patient improving L knee flexion AROM today to 106 degrees as well as improved gait mechanics without AD with minimal limp likely related to end-range quad strength needed at heel strike and throughmidstance. Instructed patient use AD less frequent to promote return to normal gait mechanics without heavy reliance on AD. Eccentric strengthening of L quad tends to remain difficult as patient is fearful of knee buckling, limiting overall progression of strength and will require continued work with this. Patient with excellent compliance with HEP as well as scar massage. Patient with MD follow-up today to discuss return to work. Patient to continue to benefit from PT to maximize function.    Rehab Potential Excellent   PT Frequency 2x / week   PT Duration 6 weeks   PT Treatment/Interventions ADLs/Self Care Home Management;Cryotherapy;Electrical Stimulation;Moist Heat;Ultrasound;Neuromuscular re-education;Balance training;Therapeutic exercise;Therapeutic activities;Functional mobility training;Stair training;Gait training;Patient/family education;Manual techniques;Passive range of motion;Vasopneumatic Device;Taping   Consulted and Agree with Plan of Care Patient      Patient will benefit from skilled therapeutic intervention in order to improve the following deficits and impairments:  Abnormal gait, Decreased activity tolerance, Decreased balance, Decreased range of motion, Decreased mobility, Decreased strength, Difficulty walking, Pain, Increased edema  Visit Diagnosis: Acute pain of left knee  Stiffness of left knee, not elsewhere classified  Difficulty in walking, not elsewhere classified  Other abnormalities of gait and mobility     Problem List Patient  Active Problem List   Diagnosis Date Noted  . Primary localized osteoarthritis of left knee 08/25/2016  . Primary osteoarthritis of left knee 08/08/2016  . History of endometrial cancer 08/08/2016  . Pain in joint, multiple sites 05/05/2012  . Hypertension   . Obesity   . Endometrial adenocarcinoma ([place]) 06/29/2006      Lanney Gins, PT, DPT 10/06/16 1:19 PM   [facility] [address]  [place], [place], [address] Phone: [phone]   Fax:  [phone]  Name: [name] MRN: [id number] Date of Birth: [birth year]

## 2016-10-07 ENCOUNTER — Ambulatory Visit: Payer: BLUE CROSS/BLUE SHIELD | Admitting: Physical Therapy

## 2016-10-10 ENCOUNTER — Ambulatory Visit: Payer: BLUE CROSS/BLUE SHIELD | Admitting: Physical Therapy

## 2016-10-10 DIAGNOSIS — R2689 Other abnormalities of gait and mobility: Secondary | ICD-10-CM

## 2016-10-10 DIAGNOSIS — R262 Difficulty in walking, not elsewhere classified: Secondary | ICD-10-CM

## 2016-10-10 DIAGNOSIS — M25662 Stiffness of left knee, not elsewhere classified: Secondary | ICD-10-CM

## 2016-10-10 DIAGNOSIS — M25562 Pain in left knee: Secondary | ICD-10-CM | POA: Diagnosis not present

## 2016-10-10 NOTE — Therapy (Signed)
[facility] [address]  [place], [place], [address] Phone: [phone]   Fax:  [phone]  Physical Therapy Treatment  Patient Details  Name: [name] MRN: [id number] Date of Birth: [birth year] Referring Provider: Dr. Elsie Saas   Encounter Date: 10/10/2016      PT End of Session - 10/10/16 1016    Visit Number 8   Number of Visits 12   Date for PT Re-Evaluation 10/27/16   PT Start Time 1013   PT Stop Time 1116   PT Time Calculation (min) 63 min   Activity Tolerance Patient tolerated treatment well   Behavior During Therapy [facility] for tasks assessed/performed      Past Medical History:  Diagnosis Date  . Arthritis   . 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 Hypertension   . Obesity    S/P lap band 2010    Past Surgical History:  Procedure Laterality Date  . ABDOMINAL HYSTERECTOMY     s/p endometrial adenocarcinoma  . LAPAROSCOPIC GASTRIC BANDING  04/17/2009  . TOTAL KNEE ARTHROPLASTY Left 08/25/2016   Procedure: TOTAL KNEE ARTHROPLASTY;  Surgeon: Elsie Saas, MD;  Location: [facility];  Service: Orthopedics;  Laterality: Left;    There were no vitals filed for this visit.      Subjective Assessment - 10/10/16 1015    Subjective Patient sawe MD this week - good report, continue with PT   Patient Stated Goals wants to get back to regular life, water zumba   Currently in Pain? No/denies   Pain Score 0-No pain                         [facility] Adult PT Treatment/Exercise - 10/10/16 1017      Ambulation/Gait   Ambulation/Gait Yes   Stairs Yes   Stairs Assistance 6: Modified independent (Device/Increase time)   Stair Management Technique One rail Right   Number of Stairs 12   Height of Stairs 8   Gait Comments reciprocal ascending; step to descending     Knee/Hip Exercises: Stretches   Sports administrator Left;3 reps;60 seconds   Quad Stretch Limitations prone with strap    Gastroc Stretch Left;3 reps;30 seconds   Gastroc Stretch Limitations blue rocker     Knee/Hip Exercises: Aerobic   Stationary Bike level 2 x 6 minutes     Knee/Hip Exercises: Machines for Strengthening   Cybex Knee Extension 25# x 15 - 2 up/1down for eccentric control of L LE     Knee/Hip Exercises: Standing   Forward Lunges Both;15 reps   Forward Lunges Limitations "mini"   Step Down 15 reps;Hand Hold: 2;Step Height: 6"   Step Down Limitations eccentric - B UE support    Wall Squat 15 reps;3 seconds   Wall Squat Limitations single hand hold     Knee/Hip Exercises: Supine   Straight Leg Raises Strengthening;Left;15 reps   Straight Leg Raises Limitations 5#   Other Supine Knee/Hip Exercises Straight leg bridge with B LE extended on peanut ball x  15 with 5 second hold     Modalities   Modalities Vasopneumatic     Vasopneumatic   Number Minutes Vasopneumatic  15 minutes   Vasopnuematic Location  Knee   Vasopneumatic Pressure High   Vasopneumatic Temperature  coldest temp                  PT Short Term Goals - 10/06/16 1306  PT SHORT TERM GOAL #1   Title Patient to improve L Knee AROM equal to that of R knee (10/06/16)   Status On-going           PT Long Term Goals - 10/06/16 1307      PT LONG TERM GOAL #1   Title patient to be independent with HEP (10/27/16)   Status Partially Met  1.5.18: pt. met for current HEP however provided updated HEP 1.5.18     PT LONG TERM GOAL #2   Title Patient to improve L knee AROM equal to that of R knee (10/27/16)   Status On-going  1.5.18: pt. able to demo L knee AROM flexion of 104 dg in seated      PT LONG TERM GOAL #3   Title Patient to demonstrate L SLR with no extensor lag demonstrating improved quad control (10/27/16)   Status On-going     PT LONG TERM GOAL #4   Title Patient to demonstrate proper gait mechanics with good heel toe gait pattern with no evidence of quad deficit (10/27/16)   Status On-going                Plan - 10/10/16 1017    Clinical Impression Statement Patient with recent MD follow-up with good report. Per MD, patient to continue PT as needed. Patient to return to work at 1/2 days strating next week for 2 weeks. Stair training today with patient able to ascend 1 flight with 1 hand rail reciprocally, however requires step to pattern with descending stairs. Patient to continue to work on eccentric quad control of surgical leg to progress comfort with stairs in the community.    PT Treatment/Interventions ADLs/Self Care Home Management;Cryotherapy;Electrical Stimulation;Moist Heat;Ultrasound;Neuromuscular re-education;Balance training;Therapeutic exercise;Therapeutic activities;Functional mobility training;Stair training;Gait training;Patient/family education;Manual techniques;Passive range of motion;Vasopneumatic Device;Taping   PT Next Visit Plan progress L knee ROM/ strength   Consulted and Agree with Plan of Care Patient      Patient will benefit from skilled therapeutic intervention in order to improve the following deficits and impairments:  Abnormal gait, Decreased activity tolerance, Decreased balance, Decreased range of motion, Decreased mobility, Decreased strength, Difficulty walking, Pain, Increased edema  Visit Diagnosis: Acute pain of left knee  Stiffness of left knee, not elsewhere classified  Difficulty in walking, not elsewhere classified  Other abnormalities of gait and mobility     Problem List Patient Active Problem List   Diagnosis Date Noted  . Primary localized osteoarthritis of left knee 08/25/2016  . Primary osteoarthritis of left knee 08/08/2016  . History of endometrial cancer 08/08/2016  . Pain in joint, multiple sites 05/05/2012  . Hypertension   . Obesity   . Endometrial adenocarcinoma ([place]) 06/29/2006      Lanney Gins, PT, DPT 10/10/16 12:00 PM   [facility] [address]  [place], [place], [address] Phone: [phone]   Fax:  [phone]  Name: [name] MRN: [id number] Date of Birth: [birth year]

## 2016-10-13 ENCOUNTER — Ambulatory Visit: Payer: BLUE CROSS/BLUE SHIELD | Admitting: Physical Therapy

## 2016-10-13 DIAGNOSIS — R2689 Other abnormalities of gait and mobility: Secondary | ICD-10-CM | POA: Diagnosis not present

## 2016-10-13 DIAGNOSIS — R262 Difficulty in walking, not elsewhere classified: Secondary | ICD-10-CM

## 2016-10-13 DIAGNOSIS — M25562 Pain in left knee: Secondary | ICD-10-CM | POA: Diagnosis not present

## 2016-10-13 DIAGNOSIS — M25662 Stiffness of left knee, not elsewhere classified: Secondary | ICD-10-CM

## 2016-10-13 NOTE — Therapy (Signed)
[facility] [address]  [place], [place], [address] Phone: [phone]   Fax:  [phone]  Physical Therapy Treatment  Patient Details  Name: [name] MRN: [id number] Date of Birth: 02/07/1966 Referring Provider: Dr. Elsie Saas   Encounter Date: 10/13/2016      PT End of Session - 10/13/16 1406    Visit Number 9   Number of Visits 12   Date for PT Re-Evaluation 10/27/16   PT Start Time 1400   PT Stop Time 1501   PT Time Calculation (min) 61 min   Activity Tolerance Patient tolerated treatment well   Behavior During Therapy [facility] for tasks assessed/performed      Past Medical History:  Diagnosis Date  . Arthritis   . Endometrial adenocarcinoma ([place]) 06/2006  . Hypertension   . Obesity    S/P lap band 2010    Past Surgical History:  Procedure Laterality Date  . ABDOMINAL HYSTERECTOMY     s/p endometrial adenocarcinoma  . LAPAROSCOPIC GASTRIC BANDING  04/17/2009  . TOTAL KNEE ARTHROPLASTY Left 08/25/2016   Procedure: TOTAL KNEE ARTHROPLASTY;  Surgeon: Elsie Saas, MD;  Location: [place];  Service: Orthopedics;  Laterality: Left;    There were no vitals filed for this visit.      Subjective Assessment - 10/13/16 1405    Subjective feeling well - went to gym this weekend to ride bike   Patient Stated Goals wants to get back to regular life, water zumba   Currently in Pain? Yes   Pain Score 1    Pain Location Knee   Pain Orientation Left   Pain Descriptors / Indicators Sore   Pain Type Acute pain;Surgical pain   Pain Onset 1 to 4 weeks ago            [facility] PT Assessment - 10/13/16 0001      AROM   Left Knee Extension -1   Left Knee Flexion 111                     [facility] Adult PT Treatment/Exercise - 10/13/16 1407      Knee/Hip Exercises: Stretches   Quad Stretch Left;3 reps;60 seconds   Quad Stretch Limitations prone with strap     Knee/Hip Exercises: Aerobic    Stationary Bike level 3 x 6 minutes     Knee/Hip Exercises: Machines for Strengthening   Cybex Knee Extension 25# x 15 - 2 up/1down for eccentric control of L LE   Cybex Knee Flexion `     Knee/Hip Exercises: Standing   Forward Lunges Both   Forward Lunges Limitations 12 reps - "mini"   Step Down 15 reps;Hand Hold: 2;Step Height: 6"   Step Down Limitations eccentric - B UE support    Functional Squat 1 set;15 reps   Functional Squat Limitations TRX   Wall Squat 15 reps;3 seconds   Wall Squat Limitations no hand hold     Knee/Hip Exercises: Seated   Other Seated Knee/Hip Exercises fitter - L LE 2 black band x 15 reps     Knee/Hip Exercises: Supine   Single Leg Bridge Strengthening;Left;15 reps   Straight Leg Raises Strengthening;Left;15 reps   Straight Leg Raises Limitations 5#     Modalities   Modalities Vasopneumatic     Vasopneumatic   Number Minutes Vasopneumatic  15 minutes   Vasopnuematic Location  Knee   Vasopneumatic Pressure High   Vasopneumatic Temperature  coldest temp                  PT Short Term Goals - 10/06/16 1306      PT SHORT TERM GOAL #1   Title Patient to improve L Knee AROM equal to that of R knee (10/06/16)   Status On-going           PT Long Term Goals - 10/06/16 1307      PT LONG TERM GOAL #1   Title patient to be independent with HEP (10/27/16)   Status Partially Met  1.5.18: pt. met for current HEP however provided updated HEP 1.5.18     PT LONG TERM GOAL #2   Title Patient to improve L knee AROM equal to that of R knee (10/27/16)   Status On-going  1.5.18: pt. able to demo L knee AROM flexion of 104 dg in seated      PT LONG TERM GOAL #3   Title Patient to demonstrate L SLR with no extensor lag demonstrating improved quad control (10/27/16)   Status On-going     PT LONG TERM GOAL #4   Title Patient to demonstrate proper gait mechanics with good heel toe gait pattern with no evidence of quad deficit (10/27/16)   Status  On-going               Plan - 10/13/16 1555    Clinical Impression Statement Patient doing well today. Has improved L knee flexion AROM up to 111 degress following prone quad stretching. Patient with much improved gait pattern without use of AD. Therapy continuing to focus on eccentric quad strength as patient is most deficient in this area and reports fear with descending stairs. Patient to continue to benefit from PT to maximize function and mobility.    PT Treatment/Interventions ADLs/Self Care Home Management;Cryotherapy;Electrical Stimulation;Moist Heat;Ultrasound;Neuromuscular re-education;Balance training;Therapeutic exercise;Therapeutic activities;Functional mobility training;Stair training;Gait training;Patient/family education;Manual techniques;Passive range of motion;Vasopneumatic Device;Taping   PT Next Visit Plan progress L knee ROM/ strength   Consulted and Agree with Plan of Care Patient      Patient will benefit from skilled therapeutic intervention in order to improve the following deficits and impairments:  Abnormal gait, Decreased activity tolerance, Decreased balance, Decreased range of motion, Decreased mobility, Decreased strength, Difficulty walking, Pain, Increased edema  Visit Diagnosis: Acute pain of left knee  Stiffness of left knee, not elsewhere classified  Difficulty in walking, not elsewhere classified  Other abnormalities of gait and mobility     Problem List Patient Active Problem List   Diagnosis Date Noted  . Primary localized osteoarthritis of left knee 08/25/2016  . Primary osteoarthritis of left knee 08/08/2016  . History of endometrial cancer 08/08/2016  . Pain in joint, multiple sites 05/05/2012  . Hypertension   . Obesity   . Endometrial adenocarcinoma ([place]) 06/29/2006     Lanney Gins, PT, DPT 10/13/16 3:58 PM   [facility] [address]  [place],  [place], [address] Phone: [phone]   Fax:  [phone]  Name: [name] MRN: [id number] Date of Birth: 02/08/1966

## 2016-10-14 ENCOUNTER — Ambulatory Visit: Payer: BLUE CROSS/BLUE SHIELD | Admitting: Physical Therapy

## 2016-10-16 ENCOUNTER — Ambulatory Visit: Payer: BLUE CROSS/BLUE SHIELD | Admitting: Physical Therapy

## 2016-10-17 ENCOUNTER — Ambulatory Visit: Payer: BLUE CROSS/BLUE SHIELD

## 2016-10-20 ENCOUNTER — Ambulatory Visit: Payer: BLUE CROSS/BLUE SHIELD | Admitting: Physical Therapy

## 2016-10-20 DIAGNOSIS — R2689 Other abnormalities of gait and mobility: Secondary | ICD-10-CM | POA: Diagnosis not present

## 2016-10-20 DIAGNOSIS — M25562 Pain in left knee: Secondary | ICD-10-CM | POA: Diagnosis not present

## 2016-10-20 DIAGNOSIS — R262 Difficulty in walking, not elsewhere classified: Secondary | ICD-10-CM | POA: Diagnosis not present

## 2016-10-20 DIAGNOSIS — M25662 Stiffness of left knee, not elsewhere classified: Secondary | ICD-10-CM | POA: Diagnosis not present

## 2016-10-20 NOTE — Therapy (Signed)
[facility] [address]  [place], [place], [address] Phone: [phone]   Fax:  [phone]  Physical Therapy Treatment  Patient Details  Name: [name] MRN: [id number] Date of Birth: Aug 16, 1966 Referring Provider: Dr. Elsie Saas   Encounter Date: 10/20/2016      PT End of Session - 10/20/16 1702    Visit Number 10   Number of Visits 12   Date for PT Re-Evaluation 10/27/16   PT Start Time 5041   PT Stop Time 1753   PT Time Calculation (min) 55 min   Activity Tolerance Patient tolerated treatment well   Behavior During Therapy [facility] for tasks assessed/performed      Past Medical History:  Diagnosis Date  . Arthritis   . Endometrial adenocarcinoma ([place]) 06/2006  . Hypertension   . Obesity    S/P lap band 2010    Past Surgical History:  Procedure Laterality Date  . ABDOMINAL HYSTERECTOMY     s/p endometrial adenocarcinoma  . LAPAROSCOPIC GASTRIC BANDING  04/17/2009  . TOTAL KNEE ARTHROPLASTY Left 08/25/2016   Procedure: TOTAL KNEE ARTHROPLASTY;  Surgeon: Elsie Saas, MD;  Location: [place];  Service: Orthopedics;  Laterality: Left;    There were no vitals filed for this visit.      Subjective Assessment - 10/20/16 1701    Subjective has been going to gym, feels well - only has nagging pain   Patient Stated Goals wants to get back to regular life, water zumba   Currently in Pain? Yes   Pain Score 1    Pain Location Knee   Pain Orientation Left   Pain Descriptors / Indicators Sore   Pain Type Acute pain;Surgical pain   Pain Onset 1 to 4 weeks ago            [facility] PT Assessment - 10/20/16 0001      AROM   Left Knee Extension -1   Left Knee Flexion 116                     [facility] Adult PT Treatment/Exercise - 10/20/16 0001      Knee/Hip Exercises: Aerobic   Stationary Bike level 2 x 6 minutes - increased range     Knee/Hip Exercises: Machines for Strengthening   Cybex Knee Extension 35# x 15 - 2 up/1down for eccentric control of L LE     Knee/Hip Exercises: Standing   Forward Lunges Both;15 reps   Forward Lunges Limitations "mini"   Forward Step Up Left;1 set;15 reps;Hand Hold: 1;Step Height: 6"   Step Down 15 reps;Hand Hold: 2;Step Height: 6"   Step Down Limitations eccentric - B UE support    Functional Squat 2 sets;15 reps   Functional Squat Limitations TRX   Other Standing Knee Exercises side stepping 20 feet each direction with green tband at ankles - slight hip and knee flexion     Knee/Hip Exercises: Supine   Single Leg Bridge Strengthening;Left;15 reps   Straight Leg Raises Strengthening;Left;15 reps   Straight Leg Raises Limitations 5#     Modalities   Modalities Vasopneumatic     Vasopneumatic   Number Minutes Vasopneumatic  15 minutes   Vasopnuematic Location  Knee   Vasopneumatic Pressure High   Vasopneumatic Temperature  coldest temp                  PT Short Term Goals - 10/06/16 1306  PT SHORT TERM GOAL #1   Title Patient to improve L Knee AROM equal to that of R knee (10/06/16)   Status On-going           PT Long Term Goals - 10/06/16 1307      PT LONG TERM GOAL #1   Title patient to be independent with HEP (10/27/16)   Status Partially Met  1.5.18: pt. met for current HEP however provided updated HEP 1.5.18     PT LONG TERM GOAL #2   Title Patient to improve L knee AROM equal to that of R knee (10/27/16)   Status On-going  1.5.18: pt. able to demo L knee AROM flexion of 104 dg in seated      PT LONG TERM GOAL #3   Title Patient to demonstrate L SLR with no extensor lag demonstrating improved quad control (10/27/16)   Status On-going     PT LONG TERM GOAL #4   Title Patient to demonstrate proper gait mechanics with good heel toe gait pattern with no evidence of quad deficit (10/27/16)   Status On-going               Plan - 10/20/16 1743    Clinical Impression Statement Patient today  with much improved gait pattern without use of AD; only slight limp likely due to some end range quad strength needed throughout gait cycle for normal gait mechanics. Patient tolerating all exercises today with some visible weakness noted with mini lunges and with eccentric step down. WIll continue to work on quad control for improved function and mobility.   PT Treatment/Interventions ADLs/Self Care Home Management;Cryotherapy;Electrical Stimulation;Moist Heat;Ultrasound;Neuromuscular re-education;Balance training;Therapeutic exercise;Therapeutic activities;Functional mobility training;Stair training;Gait training;Patient/family education;Manual techniques;Passive range of motion;Vasopneumatic Device;Taping   PT Next Visit Plan progress L knee ROM/ strength   Consulted and Agree with Plan of Care Patient      Patient will benefit from skilled therapeutic intervention in order to improve the following deficits and impairments:  Abnormal gait, Decreased activity tolerance, Decreased balance, Decreased range of motion, Decreased mobility, Decreased strength, Difficulty walking, Pain, Increased edema  Visit Diagnosis: Acute pain of left knee  Stiffness of left knee, not elsewhere classified  Difficulty in walking, not elsewhere classified  Other abnormalities of gait and mobility     Problem List Patient Active Problem List   Diagnosis Date Noted  . Primary localized osteoarthritis of left knee 08/25/2016  . Primary osteoarthritis of left knee 08/08/2016  . History of endometrial cancer 08/08/2016  . Pain in joint, multiple sites 05/05/2012  . Hypertension   . Obesity   . Endometrial adenocarcinoma ([place]) 06/29/2006    Lanney Gins, PT, DPT 10/20/16 5:54 PM   [facility] [address]  [place], [place], [address] Phone: [phone]   Fax:  [phone]  Name: [name] MRN: [id number] Date of Birth:  1966-02-26

## 2016-10-23 ENCOUNTER — Ambulatory Visit: Payer: BLUE CROSS/BLUE SHIELD

## 2016-10-23 DIAGNOSIS — R2689 Other abnormalities of gait and mobility: Secondary | ICD-10-CM | POA: Diagnosis not present

## 2016-10-23 DIAGNOSIS — R262 Difficulty in walking, not elsewhere classified: Secondary | ICD-10-CM | POA: Diagnosis not present

## 2016-10-23 DIAGNOSIS — M25562 Pain in left knee: Secondary | ICD-10-CM

## 2016-10-23 DIAGNOSIS — M25662 Stiffness of left knee, not elsewhere classified: Secondary | ICD-10-CM

## 2016-10-23 NOTE — Therapy (Signed)
[facility] [address]  [place], [place], [address] Phone: [phone]   Fax:  [phone]  Physical Therapy Treatment  Patient Details  Name: [name] MRN: [id number] Date of Birth: 11/22/1965 Referring Provider: Dr. Elsie Saas   Encounter Date: 10/23/2016      PT End of Session - 10/23/16 1707    Visit Number 11   Number of Visits 12   Date for PT Re-Evaluation 10/27/16   PT Start Time 1702   PT Stop Time 1800   PT Time Calculation (min) 58 min   Activity Tolerance Patient tolerated treatment well   Behavior During Therapy [facility] for tasks assessed/performed      Past Medical History:  Diagnosis Date  . Arthritis   . 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 Hypertension   . Obesity    S/P lap band 2010    Past Surgical History:  Procedure Laterality Date  . ABDOMINAL HYSTERECTOMY     s/p endometrial adenocarcinoma  . LAPAROSCOPIC GASTRIC BANDING  04/17/2009  . TOTAL KNEE ARTHROPLASTY Left 08/25/2016   Procedure: TOTAL KNEE ARTHROPLASTY;  Surgeon: Elsie Saas, MD;  Location: [place];  Service: Orthopedics;  Laterality: Left;    There were no vitals filed for this visit.      Subjective Assessment - 10/23/16 1706    Subjective Pt. reporting she is going to gym and using bike regularly.     Patient Stated Goals wants to get back to regular life, water zumba   Currently in Pain? No/denies   Pain Score 0-No pain   Multiple Pain Sites No           [facility] Adult PT Treatment/Exercise - 10/23/16 1711      Knee/Hip Exercises: Stretches   Quad Stretch 1 rep;Left;60 seconds   Quad Stretch Limitations Supine with strap in mod thomas position     Knee/Hip Exercises: Aerobic   Stationary Bike level 2 x 6 minutes - increased range     Knee/Hip Exercises: Machines for Strengthening   Cybex Knee Extension 35# x 15 - 2 up/1down for eccentric control of L LE     Knee/Hip Exercises: Standing    Lateral Step Up 10 reps;Step Height: 6";1 set;Right;Left   Lateral Step Up Limitations Alternating side step over on 6" step; 2 UE support    Forward Step Up Left;1 set;15 reps;Hand Hold: 1;Step Height: 6"  with alternating R hip flexion at top   Functional Squat 15 reps;1 set   Functional Squat Limitations TRX   Other Standing Knee Exercises Standing alternating lunge onto BOSU ball (up) x 10 reps each      Knee/Hip Exercises: Supine   Straight Leg Raises Strengthening;Left;15 reps;2 sets   Straight Leg Raises Limitations 5#   Other Supine Knee/Hip Exercises Straight leg bridge with B LE extended on peanut ball x  15 with 5 second hold   Other Supine Knee/Hip Exercises R SL bridg with L SLR x 15 reps     Modalities   Modalities Vasopneumatic     Vasopneumatic   Number Minutes Vasopneumatic  15 minutes   Vasopnuematic Location  Knee   Vasopneumatic Pressure Medium   Vasopneumatic Temperature  coldest temp           PT Short Term Goals - 10/06/16 1306      PT SHORT TERM GOAL #1   Title Patient to improve L Knee AROM equal to that of R knee (10/06/16)  Status On-going           PT Long Term Goals - 10/06/16 1307      PT LONG TERM GOAL #1   Title patient to be independent with HEP (10/27/16)   Status Partially Met  1.5.18: pt. met for current HEP however provided updated HEP 1.5.18     PT LONG TERM GOAL #2   Title Patient to improve L knee AROM equal to that of R knee (10/27/16)   Status On-going  1.5.18: pt. able to demo L knee AROM flexion of 104 dg in seated      PT LONG TERM GOAL #3   Title Patient to demonstrate L SLR with no extensor lag demonstrating improved quad control (10/27/16)   Status On-going     PT LONG TERM GOAL #4   Title Patient to demonstrate proper gait mechanics with good heel toe gait pattern with no evidence of quad deficit (10/27/16)   Status On-going               Plan - 10/23/16 1707    Clinical Impression Statement Pt.  tolerating progression of stepping and lunging activity well today confirming appropriate challenge level with added difficulty.  Pt. reporting riding recumbent bike at gym regularly and verbalizing good understanding of therapy goals at this time.  Pt. reporting good HEP adherence and very compliant with therapy in treatment.  Pt. with one more visit left in current POC, which will be with PT on 1.30.18.  Pt. with good progress with therapy to date.  Will plan to progress stepping/lunging/squatting activities per pt. tolerance in coming visits.   PT Treatment/Interventions ADLs/Self Care Home Management;Cryotherapy;Electrical Stimulation;Moist Heat;Ultrasound;Neuromuscular re-education;Balance training;Therapeutic exercise;Therapeutic activities;Functional mobility training;Stair training;Gait training;Patient/family education;Manual techniques;Passive range of motion;Vasopneumatic Device;Taping   PT Next Visit Plan Recert; progress L knee ROM/ strength      Patient will benefit from skilled therapeutic intervention in order to improve the following deficits and impairments:  Abnormal gait, Decreased activity tolerance, Decreased balance, Decreased range of motion, Decreased mobility, Decreased strength, Difficulty walking, Pain, Increased edema  Visit Diagnosis: Acute pain of left knee  Stiffness of left knee, not elsewhere classified  Difficulty in walking, not elsewhere classified  Other abnormalities of gait and mobility     Problem List Patient Active Problem List   Diagnosis Date Noted  . Primary localized osteoarthritis of left knee 08/25/2016  . Primary osteoarthritis of left knee 08/08/2016  . History of endometrial cancer 08/08/2016  . Pain in joint, multiple sites 05/05/2012  . Hypertension   . Obesity   . Endometrial adenocarcinoma ([place]) 06/29/2006    Bess Harvest, PTA 10/23/16 6:10 PM   [facility] [address]  [place], [place], [address] Phone: [phone]   Fax:  [phone]  Name: [name] MRN: [id number] Date of Birth: [birth year]

## 2016-10-27 ENCOUNTER — Ambulatory Visit: Payer: BLUE CROSS/BLUE SHIELD

## 2016-10-28 ENCOUNTER — Ambulatory Visit: Payer: BLUE CROSS/BLUE SHIELD | Admitting: Physical Therapy

## 2016-10-28 DIAGNOSIS — R2689 Other abnormalities of gait and mobility: Secondary | ICD-10-CM | POA: Diagnosis not present

## 2016-10-28 DIAGNOSIS — M25662 Stiffness of left knee, not elsewhere classified: Secondary | ICD-10-CM

## 2016-10-28 DIAGNOSIS — M25562 Pain in left knee: Secondary | ICD-10-CM | POA: Diagnosis not present

## 2016-10-28 DIAGNOSIS — R262 Difficulty in walking, not elsewhere classified: Secondary | ICD-10-CM

## 2016-10-28 NOTE — Therapy (Signed)
[facility] [address]  [place], [place], [address] Phone: [phone]   Fax:  [phone]  Physical Therapy Treatment  Patient Details  Name: [name] MRN: [id number] Date of Birth: 1966/04/24 Referring Provider: Dr. Elsie Saas   Encounter Date: 10/28/2016      PT End of Session - 10/28/16 1704    Visit Number 12   Number of Visits 16   Date for PT Re-Evaluation 11/14/16   PT Start Time 1700   PT Stop Time 1758   PT Time Calculation (min) 58 min   Activity Tolerance Patient tolerated treatment well   Behavior During Therapy [facility] for tasks assessed/performed      Past Medical History:  Diagnosis Date  . Arthritis   . 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 Hypertension   . Obesity    S/P lap band 2010    Past Surgical History:  Procedure Laterality Date  . ABDOMINAL HYSTERECTOMY     s/p endometrial adenocarcinoma  . LAPAROSCOPIC GASTRIC BANDING  04/17/2009  . TOTAL KNEE ARTHROPLASTY Left 08/25/2016   Procedure: TOTAL KNEE ARTHROPLASTY;  Surgeon: Elsie Saas, MD;  Location: [place];  Service: Orthopedics;  Laterality: Left;    There were no vitals filed for this visit.      Subjective Assessment - 10/28/16 1809    Subjective back to full days at work - has to get up a lot during the day due to tightness   Patient Stated Goals wants to get back to regular life, water zumba   Currently in Pain? No/denies   Pain Score 0-No pain            [facility] PT Assessment - 10/28/16 0001      AROM   Left Knee Extension -2   Left Knee Flexion 118                     [facility] Adult PT Treatment/Exercise - 10/28/16 0001      Ambulation/Gait   Stairs Yes   Stairs Assistance 5: Supervision   Stair Management Technique One rail Right   Number of Stairs 12   Height of Stairs 8   Gait Comments 2 sets - reciprocal stepping     Knee/Hip Exercises: Stretches   Sports administrator Left;3  reps;60 seconds   Quad Stretch Limitations prone with strap     Knee/Hip Exercises: Aerobic   Nustep level 6 x 6 minutes     Knee/Hip Exercises: Standing   Forward Lunges Both;15 reps   Forward Lunges Limitations 1 hand hold   Step Down Left;15 reps;Hand Hold: 1;Step Height: 6"   Step Down Limitations eccentric   Functional Squat 2 sets;15 reps   Functional Squat Limitations TRX     Knee/Hip Exercises: Supine   Single Leg Bridge Strengthening;Left;15 reps   Straight Leg Raises Strengthening;Left;15 reps   Straight Leg Raises Limitations 5#     Modalities   Modalities Vasopneumatic     Vasopneumatic   Number Minutes Vasopneumatic  15 minutes   Vasopnuematic Location  Knee   Vasopneumatic Pressure High   Vasopneumatic Temperature  coldest temp                  PT Short Term Goals - 10/28/16 1810      PT SHORT TERM GOAL #1   Title Patient to improve L Knee AROM equal to that of R knee (10/06/16)   Status On-going  PT Long Term Goals - 10/28/16 1810      PT LONG TERM GOAL #1   Title patient to be independent with HEP (10/27/16)   Status On-going     PT LONG TERM GOAL #2   Title Patient to improve L knee AROM equal to that of R knee (10/27/16)   Status On-going     PT LONG TERM GOAL #3   Title Patient to demonstrate L SLR with no extensor lag demonstrating improved quad control (10/27/16)   Status Achieved     PT LONG TERM GOAL #4   Title Patient to demonstrate proper gait mechanics with good heel toe gait pattern with no evidence of quad deficit (10/27/16)   Status Achieved               Plan - 10/28/16 1705    Clinical Impression Statement Daiyana doing very well as a result of PT intervetnion. Patient has progressed gait mechanics with good heel toe gait pattern, is ambulating up/down 1 flight of stairs with reciprocal gait pattern with 1 hand rail, has improved AROM of L knee to almost equal that of R, as well as improved L LE strength  with improving eccentric quad control. Patient tolerating all therapeutic exercise with no pain, only appropriate muscle soreness and fatigue. Patient to continue to benefit from PT to address eccentric quad strength and overall endurance of L LE.    PT Frequency 2x / week   PT Duration 2 weeks   PT Treatment/Interventions ADLs/Self Care Home Management;Cryotherapy;Electrical Stimulation;Moist Heat;Ultrasound;Neuromuscular re-education;Balance training;Therapeutic exercise;Therapeutic activities;Functional mobility training;Stair training;Gait training;Patient/family education;Manual techniques;Passive range of motion;Vasopneumatic Device;Taping   PT Next Visit Plan progress L knee strength   Consulted and Agree with Plan of Care Patient      Patient will benefit from skilled therapeutic intervention in order to improve the following deficits and impairments:  Abnormal gait, Decreased activity tolerance, Decreased balance, Decreased range of motion, Decreased mobility, Decreased strength, Difficulty walking, Pain, Increased edema  Visit Diagnosis: Acute pain of left knee - Plan: PT plan of care cert/re-cert  Stiffness of left knee, not elsewhere classified - Plan: PT plan of care cert/re-cert  Difficulty in walking, not elsewhere classified - Plan: PT plan of care cert/re-cert  Other abnormalities of gait and mobility - Plan: PT plan of care cert/re-cert     Problem List Patient Active Problem List   Diagnosis Date Noted  . Primary localized osteoarthritis of left knee 08/25/2016  . Primary osteoarthritis of left knee 08/08/2016  . History of endometrial cancer 08/08/2016  . Pain in joint, multiple sites 05/05/2012  . Hypertension   . Obesity   . Endometrial adenocarcinoma ([place]) 06/29/2006     Lanney Gins, PT, DPT 10/28/16 6:15 PM   [facility] [address]  [place], [place], [address] Phone: [phone]    Fax:  [phone]  Name: MACLYNN TOLLEY MRN: [id number] Date of Birth: May 06, 1966

## 2016-10-30 ENCOUNTER — Ambulatory Visit: Payer: BLUE CROSS/BLUE SHIELD | Attending: Orthopedic Surgery | Admitting: Physical Therapy

## 2016-10-30 DIAGNOSIS — M25662 Stiffness of left knee, not elsewhere classified: Secondary | ICD-10-CM | POA: Insufficient documentation

## 2016-10-30 DIAGNOSIS — M25562 Pain in left knee: Secondary | ICD-10-CM

## 2016-10-30 DIAGNOSIS — R262 Difficulty in walking, not elsewhere classified: Secondary | ICD-10-CM | POA: Diagnosis not present

## 2016-10-30 DIAGNOSIS — R2689 Other abnormalities of gait and mobility: Secondary | ICD-10-CM | POA: Insufficient documentation

## 2016-10-31 NOTE — Therapy (Signed)
[facility] [address]  [place], [place], [address] Phone: [phone]   Fax:  [phone]  Physical Therapy Treatment  Patient Details  Name: [name] MRN: [id number] Date of Birth: 03-04-1966 Referring Provider: Dr. Elsie Saas   Encounter Date: 10/30/2016      PT End of Session - 10/30/16 1615    Visit Number 13   Number of Visits 16   Date for PT Re-Evaluation 11/14/16   PT Start Time 1611   PT Stop Time 1710   PT Time Calculation (min) 59 min   Activity Tolerance Patient tolerated treatment well   Behavior During Therapy [facility] for tasks assessed/performed      Past Medical History:  Diagnosis Date  . Arthritis   . Endometrial adenocarcinoma ([place]) 06/2006  . Hypertension   . Obesity    S/P lap band 2010    Past Surgical History:  Procedure Laterality Date  . ABDOMINAL HYSTERECTOMY     s/p endometrial adenocarcinoma  . LAPAROSCOPIC GASTRIC BANDING  04/17/2009  . TOTAL KNEE ARTHROPLASTY Left 08/25/2016   Procedure: TOTAL KNEE ARTHROPLASTY;  Surgeon: Elsie Saas, MD;  Location: [place];  Service: Orthopedics;  Laterality: Left;    There were no vitals filed for this visit.      Subjective Assessment - 10/30/16 1615    Subjective feeling a little stiff today   Patient Stated Goals wants to get back to regular life, water zumba   Currently in Pain? No/denies   Pain Score 0-No pain                         [facility] Adult PT Treatment/Exercise - 10/30/16 0001      Knee/Hip Exercises: Aerobic   Stationary Bike level 3 x 6 minutes     Knee/Hip Exercises: Machines for Strengthening   Cybex Knee Extension 35# x 15 - 2 up/1down for eccentric control of L LE     Knee/Hip Exercises: Standing   Forward Lunges Both;15 reps   Forward Lunges Limitations 1 hand hold   Side Lunges Limitations side stepping 25 feet each way with green tband at ankles   Step Down Left;15 reps;Hand  Hold: 1;Step Height: 6"   Step Down Limitations eccentric   Functional Squat 2 sets;15 reps   Functional Squat Limitations TRX; 2nd set with ankle plantarflexion   SLS L LE -  4 x 30 seconds - light UE support - firm surface     Knee/Hip Exercises: Supine   Single Leg Bridge Strengthening;Left;15 reps   Straight Leg Raises Strengthening;Left;15 reps   Straight Leg Raises Limitations 6#   Other Supine Knee/Hip Exercises Straight leg bridge with B LE extended on peanut ball x  15 with 5 second hold     Modalities   Modalities Vasopneumatic     Vasopneumatic   Number Minutes Vasopneumatic  15 minutes   Vasopnuematic Location  Knee   Vasopneumatic Pressure High   Vasopneumatic Temperature  coldest temp                  PT Short Term Goals - 10/28/16 1810      PT SHORT TERM GOAL #1   Title Patient to improve L Knee AROM equal to that of R knee (10/06/16)   Status On-going           PT Long Term Goals - 10/28/16 1810      PT  LONG TERM GOAL #1   Title patient to be independent with HEP (10/27/16)   Status On-going     PT LONG TERM GOAL #2   Title Patient to improve L knee AROM equal to that of R knee (10/27/16)   Status On-going     PT LONG TERM GOAL #3   Title Patient to demonstrate L SLR with no extensor lag demonstrating improved quad control (10/27/16)   Status Achieved     PT LONG TERM GOAL #4   Title Patient to demonstrate proper gait mechanics with good heel toe gait pattern with no evidence of quad deficit (10/27/16)   Status Achieved               Plan - 10/30/16 1658    Clinical Impression Statement Patient doing well today. Some stiffness which patient attirbutes to sitting for most of her work day. Patient conitnued to progress well, however, has difficulty bending knee with eccentric work - likely realted to fear of buckiling or inability to perform. Will continue to work on this. Patient to continue to benefit from PT to maximize function.     PT Treatment/Interventions ADLs/Self Care Home Management;Cryotherapy;Electrical Stimulation;Moist Heat;Ultrasound;Neuromuscular re-education;Balance training;Therapeutic exercise;Therapeutic activities;Functional mobility training;Stair training;Gait training;Patient/family education;Manual techniques;Passive range of motion;Vasopneumatic Device;Taping   PT Next Visit Plan progress L knee strength   Consulted and Agree with Plan of Care Patient      Patient will benefit from skilled therapeutic intervention in order to improve the following deficits and impairments:  Abnormal gait, Decreased activity tolerance, Decreased balance, Decreased range of motion, Decreased mobility, Decreased strength, Difficulty walking, Pain, Increased edema  Visit Diagnosis: Acute pain of left knee  Stiffness of left knee, not elsewhere classified  Difficulty in walking, not elsewhere classified  Other abnormalities of gait and mobility     Problem List Patient Active Problem List   Diagnosis Date Noted  . Primary localized osteoarthritis of left knee 08/25/2016  . Primary osteoarthritis of left knee 08/08/2016  . History of endometrial cancer 08/08/2016  . Pain in joint, multiple sites 05/05/2012  . Hypertension   . Obesity   . Endometrial adenocarcinoma ([place]) 06/29/2006     Lanney Gins, PT, DPT 10/31/16 7:50 AM   [facility] [address]  [place], [place], [address] Phone: [phone]   Fax:  [phone]  Name: LUN BREIT MRN: [id number] Date of Birth: [birth year]

## 2016-11-04 ENCOUNTER — Ambulatory Visit: Payer: BLUE CROSS/BLUE SHIELD | Admitting: Physical Therapy

## 2016-11-04 DIAGNOSIS — M25662 Stiffness of left knee, not elsewhere classified: Secondary | ICD-10-CM

## 2016-11-04 DIAGNOSIS — M25562 Pain in left knee: Secondary | ICD-10-CM | POA: Diagnosis not present

## 2016-11-04 DIAGNOSIS — R262 Difficulty in walking, not elsewhere classified: Secondary | ICD-10-CM | POA: Diagnosis not present

## 2016-11-04 DIAGNOSIS — R2689 Other abnormalities of gait and mobility: Secondary | ICD-10-CM | POA: Diagnosis not present

## 2016-11-04 NOTE — Therapy (Signed)
[facility] [address]  [place], [place], [address] Phone: [phone]   Fax:  [phone]  Physical Therapy Treatment  Patient Details  Name: [name] MRN: [id number] Date of Birth: 04/21/1966 Referring Provider: Dr. Elsie Saas   Encounter Date: 11/04/2016      PT End of Session - 11/04/16 1612    Visit Number 14   Number of Visits 16   Date for PT Re-Evaluation 11/14/16   PT Start Time [id number]   PT Stop Time 1718   PT Time Calculation (min) 69 min   Activity Tolerance Patient tolerated treatment well   Behavior During Therapy [facility] for tasks assessed/performed      Past Medical History:  Diagnosis Date  . Arthritis   . Endometrial adenocarcinoma ([place]) 06/2006  . Hypertension   . Obesity    S/P lap band 2010    Past Surgical History:  Procedure Laterality Date  . ABDOMINAL HYSTERECTOMY     s/p endometrial adenocarcinoma  . LAPAROSCOPIC GASTRIC BANDING  04/17/2009  . TOTAL KNEE ARTHROPLASTY Left 08/25/2016   Procedure: TOTAL KNEE ARTHROPLASTY;  Surgeon: Elsie Saas, MD;  Location: [place];  Service: Orthopedics;  Laterality: Left;    There were no vitals filed for this visit.      Subjective Assessment - 11/04/16 1611    Subjective Overall feeling well - some "random" sharp pain in L knee   Patient Stated Goals wants to get back to regular life, water zumba   Currently in Pain? No/denies   Pain Score 0-No pain            [facility] PT Assessment - 11/04/16 1618      AROM   Left Knee Extension -2   Left Knee Flexion 118                     [facility] Adult PT Treatment/Exercise - 11/04/16 1617      Ambulation/Gait   Stairs Yes   Stairs Assistance 5: Supervision   Stair Management Technique One rail Right   Number of Stairs 12   Height of Stairs 8   Gait Comments 1 set - reciproca;     Knee/Hip Exercises: Public affairs consultant Left;3 reps;60 seconds   Quad Stretch  Limitations prone with strap     Knee/Hip Exercises: Aerobic   Stationary Bike level 3 x 6 minutes     Knee/Hip Exercises: Machines for Strengthening   Cybex Knee Extension 35# x 15 - 2 up/1down for eccentric control of L LE     Knee/Hip Exercises: Standing   Forward Lunges Both;15 reps   Forward Lunges Limitations 1 hand hold   Side Lunges Limitations side stepping 25 feet each way with green tband at ankles   Step Down Left;1 set;15 reps;Hand Hold: 2;Step Height: 8"   Step Down Limitations eccentric   Functional Squat 1 set;15 reps   Functional Squat Limitations TRX   Wall Squat 15 reps;3 seconds   Wall Squat Limitations green tband at knees   SLS L LE -  4 x 30 seconds - light UE support - compliant surface     Knee/Hip Exercises: Supine   Single Leg Bridge Strengthening;Left;15 reps   Other Supine Knee/Hip Exercises straight leg bridge with HS curl x 10 on peanut ball     Modalities   Modalities Vasopneumatic     Vasopneumatic   Number Minutes Vasopneumatic  15 minutes  Vasopnuematic Location  Knee   Vasopneumatic Pressure High   Vasopneumatic Temperature  coldest temp                  PT Short Term Goals - 10/28/16 1810      PT SHORT TERM GOAL #1   Title Patient to improve L Knee AROM equal to that of R knee (10/06/16)   Status On-going           PT Long Term Goals - 10/28/16 1810      PT LONG TERM GOAL #1   Title patient to be independent with HEP (10/27/16)   Status On-going     PT LONG TERM GOAL #2   Title Patient to improve L knee AROM equal to that of R knee (10/27/16)   Status On-going     PT LONG TERM GOAL #3   Title Patient to demonstrate L SLR with no extensor lag demonstrating improved quad control (10/27/16)   Status Achieved     PT LONG TERM GOAL #4   Title Patient to demonstrate proper gait mechanics with good heel toe gait pattern with no evidence of quad deficit (10/27/16)   Status Achieved               Plan - 11/04/16  1615    Clinical Impression Statement Dalarie continues to do very well with all therapy intervention. Gait much improved with stairs with Iylah able to take reciprocal steps up/down 12 steps of 8" height. Kasumi does continue to struggle with eccentric quad strength with step downs, however has gottet better with reduced fear of falling. [place] and PT discussing finishing out plan of care next week to establish comprehensive HEP, with both agreeing on this, as Kynadee will likely be ready for independent HEP practive at that time.    PT Treatment/Interventions ADLs/Self Care Home Management;Cryotherapy;Electrical Stimulation;Moist Heat;Ultrasound;Neuromuscular re-education;Balance training;Therapeutic exercise;Therapeutic activities;Functional mobility training;Stair training;Gait training;Patient/family education;Manual techniques;Passive range of motion;Vasopneumatic Device;Taping   PT Next Visit Plan eccentric quad strength, establish comprehensive HEP   Consulted and Agree with Plan of Care Patient      Patient will benefit from skilled therapeutic intervention in order to improve the following deficits and impairments:  Abnormal gait, Decreased activity tolerance, Decreased balance, Decreased range of motion, Decreased mobility, Decreased strength, Difficulty walking, Pain, Increased edema  Visit Diagnosis: Acute pain of left knee  Stiffness of left knee, not elsewhere classified  Difficulty in walking, not elsewhere classified  Other abnormalities of gait and mobility     Problem List Patient Active Problem List   Diagnosis Date Noted  . Primary localized osteoarthritis of left knee 08/25/2016  . Primary osteoarthritis of left knee 08/08/2016  . History of endometrial cancer 08/08/2016  . Pain in joint, multiple sites 05/05/2012  . Hypertension   . Obesity   . Endometrial adenocarcinoma ([place]) 06/29/2006    Lanney Gins, PT, DPT 11/04/16 6:08 PM   [facility] [place] [facility] [address]  [place], [place], [address] Phone: [phone]   Fax:  [phone]  Name: ARIANIE THORN MRN: [id number] Date of Birth: 26-Sep-1966

## 2016-11-06 DIAGNOSIS — M1712 Unilateral primary osteoarthritis, left knee: Secondary | ICD-10-CM | POA: Diagnosis not present

## 2016-11-11 ENCOUNTER — Ambulatory Visit: Payer: BLUE CROSS/BLUE SHIELD | Admitting: Physical Therapy

## 2016-11-11 DIAGNOSIS — R262 Difficulty in walking, not elsewhere classified: Secondary | ICD-10-CM

## 2016-11-11 DIAGNOSIS — M25562 Pain in left knee: Secondary | ICD-10-CM | POA: Diagnosis not present

## 2016-11-11 DIAGNOSIS — M25662 Stiffness of left knee, not elsewhere classified: Secondary | ICD-10-CM

## 2016-11-11 DIAGNOSIS — R2689 Other abnormalities of gait and mobility: Secondary | ICD-10-CM | POA: Diagnosis not present

## 2016-11-11 NOTE — Therapy (Signed)
[facility] [address]  [place], [place], [address] Phone: [phone]   Fax:  [phone]  Physical Therapy Treatment  Patient Details  Name: [name] MRN: [id number] Date of Birth: [birth year] Referring Provider: Dr. Elsie Saas   Encounter Date: 11/11/2016      PT End of Session - 11/11/16 1624    Visit Number 15   Number of Visits 16   Date for PT Re-Evaluation 11/14/16   PT Start Time 1620   PT Stop Time 1717   PT Time Calculation (min) 57 min   Activity Tolerance Patient tolerated treatment well   Behavior During Therapy [facility] for tasks assessed/performed      Past Medical History:  Diagnosis Date  . Arthritis   . 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 Hypertension   . Obesity    S/P lap band 2010    Past Surgical History:  Procedure Laterality Date  . ABDOMINAL HYSTERECTOMY     s/p endometrial adenocarcinoma  . LAPAROSCOPIC GASTRIC BANDING  04/17/2009  . TOTAL KNEE ARTHROPLASTY Left 08/25/2016   Procedure: TOTAL KNEE ARTHROPLASTY;  Surgeon: Elsie Saas, MD;  Location: [place];  Service: Orthopedics;  Laterality: Left;    There were no vitals filed for this visit.      Subjective Assessment - 11/11/16 1624    Subjective feeling well - knee is itchy   Patient Stated Goals wants to get back to regular life, water zumba   Currently in Pain? No/denies   Pain Score 0-No pain                         [facility] Adult PT Treatment/Exercise - 11/11/16 1625      Knee/Hip Exercises: Aerobic   Stationary Bike level 3 x 6 minutes     Knee/Hip Exercises: Machines for Strengthening   Cybex Knee Extension 35# x 15 - 2 up/1down for eccentric control of L LE     Knee/Hip Exercises: Standing   Forward Lunges Both;15 reps   Forward Lunges Limitations 1 hand hold   Side Lunges Limitations side stepping 30 feet each way with blue tband at ankles   Step Down Left;1 set;15  reps;Hand Hold: 2;Step Height: 8"   Step Down Limitations eccentric   Functional Squat 1 set;15 reps   Functional Squat Limitations TRX with heel raise   Wall Squat 15 reps;3 seconds   Wall Squat Limitations blue tband at knees     Knee/Hip Exercises: Seated   Other Seated Knee/Hip Exercises long sitting on floor to standing to simulate getting out of tub x 2 - good form - no pressur eon surgical knee     Modalities   Modalities Vasopneumatic     Vasopneumatic   Number Minutes Vasopneumatic  15 minutes   Vasopnuematic Location  Knee   Vasopneumatic Pressure High   Vasopneumatic Temperature  coldest temp                  PT Short Term Goals - 10/28/16 1810      PT SHORT TERM GOAL #1   Title Patient to improve L Knee AROM equal to that of R knee (10/06/16)   Status On-going           PT Long Term Goals - 10/28/16 1810      PT LONG TERM GOAL #1   Title patient to be independent with HEP (10/27/16)   Status  On-going     PT LONG TERM GOAL #2   Title Patient to improve L knee AROM equal to that of R knee (10/27/16)   Status On-going     PT LONG TERM GOAL #3   Title Patient to demonstrate L SLR with no extensor lag demonstrating improved quad control (10/27/16)   Status Achieved     PT LONG TERM GOAL #4   Title Patient to demonstrate proper gait mechanics with good heel toe gait pattern with no evidence of quad deficit (10/27/16)   Status Achieved               Plan - 11/11/16 1744    Clinical Impression Statement [name] doing well today - review of HEP along with trouble-shooting and practicing transfers floor to standing to simulate getting out of the bathtub, as patient has not yet tried this and is fearful to try on her own. Patient did well with activity with little to no weight placed thorugh surgical knee as well as no LOB or fall. Patient to be seen for last visit next visit and will be able to transition to HEP independently at that time.    PT  Treatment/Interventions ADLs/Self Care Home Management;Cryotherapy;Electrical Stimulation;Moist Heat;Ultrasound;Neuromuscular re-education;Balance training;Therapeutic exercise;Therapeutic activities;Functional mobility training;Stair training;Gait training;Patient/family education;Manual techniques;Passive range of motion;Vasopneumatic Device;Taping   Consulted and Agree with Plan of Care Patient      Patient will benefit from skilled therapeutic intervention in order to improve the following deficits and impairments:  Abnormal gait, Decreased activity tolerance, Decreased balance, Decreased range of motion, Decreased mobility, Decreased strength, Difficulty walking, Pain, Increased edema  Visit Diagnosis: Acute pain of left knee  Stiffness of left knee, not elsewhere classified  Difficulty in walking, not elsewhere classified  Other abnormalities of gait and mobility     Problem List Patient Active Problem List   Diagnosis Date Noted  . Primary localized osteoarthritis of left knee 08/25/2016  . Primary osteoarthritis of left knee 08/08/2016  . History of endometrial cancer 08/08/2016  . Pain in joint, multiple sites 05/05/2012  . Hypertension   . Obesity   . Endometrial adenocarcinoma ([place]) 06/29/2006    Lanney Gins, PT, DPT 11/11/16 5:51 PM   [facility] [address]  [place], [place], [address] Phone: [phone]   Fax:  [phone]  Name: [name] MRN: [id number] Date of Birth: [birth year]

## 2016-11-12 DIAGNOSIS — Z6841 Body Mass Index (BMI) 40.0 and over, adult: Secondary | ICD-10-CM | POA: Diagnosis not present

## 2016-11-13 ENCOUNTER — Ambulatory Visit: Payer: BLUE CROSS/BLUE SHIELD | Admitting: Physical Therapy

## 2016-11-13 DIAGNOSIS — M25562 Pain in left knee: Secondary | ICD-10-CM | POA: Diagnosis not present

## 2016-11-13 DIAGNOSIS — M25662 Stiffness of left knee, not elsewhere classified: Secondary | ICD-10-CM | POA: Diagnosis not present

## 2016-11-13 DIAGNOSIS — R2689 Other abnormalities of gait and mobility: Secondary | ICD-10-CM

## 2016-11-13 DIAGNOSIS — R262 Difficulty in walking, not elsewhere classified: Secondary | ICD-10-CM

## 2016-11-13 NOTE — Therapy (Signed)
[facility] [address]  [place], [place], [address] Phone: [phone]   Fax:  [phone]  Physical Therapy Treatment  Patient Details  Name: [name] MRN: [id number] Date of Birth: Jan 23, 1966 Referring Provider: Dr. Elsie Saas   Encounter Date: 11/13/2016      PT End of Session - 11/13/16 1624    Visit Number 16   Number of Visits 16   Date for PT Re-Evaluation 11/14/16   PT Start Time 8242   PT Stop Time 1711   PT Time Calculation (min) 56 min   Activity Tolerance Patient tolerated treatment well   Behavior During Therapy [facility] for tasks assessed/performed      Past Medical History:  Diagnosis Date  . Arthritis   . 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 Hypertension   . Obesity    S/P lap band 2010    Past Surgical History:  Procedure Laterality Date  . ABDOMINAL HYSTERECTOMY     s/p endometrial adenocarcinoma  . LAPAROSCOPIC GASTRIC BANDING  04/17/2009  . TOTAL KNEE ARTHROPLASTY Left 08/25/2016   Procedure: TOTAL KNEE ARTHROPLASTY;  Surgeon: Elsie Saas, MD;  Location: [employer];  Service: Orthopedics;  Laterality: Left;    There were no vitals filed for this visit.      Subjective Assessment - 11/13/16 1624    Subjective feels well - tried getting in/out of tub - some difficulty.    Patient Stated Goals wants to get back to regular life, water zumba   Currently in Pain? No/denies   Pain Score 0-No pain            [facility] PT Assessment - 11/13/16 0001      AROM   Left Knee Extension -2   Left Knee Flexion 120                     [facility] Adult PT Treatment/Exercise - 11/13/16 0001      Knee/Hip Exercises: Stretches   Sports administrator Left;3 reps;60 seconds   Quad Stretch Limitations prone with strap     Knee/Hip Exercises: Aerobic   Stationary Bike L3 x 6 minutes     Knee/Hip Exercises: Machines for Strengthening   Cybex Knee Extension 35# x 15 - 2 up/1down  for eccentric control of L LE     Knee/Hip Exercises: Standing   Step Down Left;1 set;15 reps;Hand Hold: 2;Step Height: 8"   Step Down Limitations eccentric   Functional Squat 1 set;15 reps   Functional Squat Limitations TRX with heel raise   Wall Squat 15 reps;3 seconds   Wall Squat Limitations blue tband at knees   SLS L LE -  8 x 15-30 seconds - light UE support - compliant surface     Knee/Hip Exercises: Supine   Single Leg Bridge Strengthening;Left;15 reps   Other Supine Knee/Hip Exercises straight leg bridge with HS curl x 15 on peanut ball     Modalities   Modalities Vasopneumatic     Vasopneumatic   Number Minutes Vasopneumatic  15 minutes   Vasopnuematic Location  Knee   Vasopneumatic Pressure High   Vasopneumatic Temperature  coldest temp                  PT Short Term Goals - 11/13/16 1747      PT SHORT TERM GOAL #1   Title Patient to improve L Knee AROM equal to that of R knee (10/06/16)   Status  Achieved           PT Long Term Goals - 11/13/16 1625      PT LONG TERM GOAL #1   Title patient to be independent with HEP (10/27/16)   Status Achieved     PT LONG TERM GOAL #2   Title Patient to improve L knee AROM equal to that of R knee (10/27/16)   Status Achieved     PT LONG TERM GOAL #3   Title Patient to demonstrate L SLR with no extensor lag demonstrating improved quad control (10/27/16)   Status Achieved     PT LONG TERM GOAL #4   Title Patient to demonstrate proper gait mechanics with good heel toe gait pattern with no evidence of quad deficit (10/27/16)   Status Achieved               Plan - 11/13/16 1747    Clinical Impression Statement [name] has done very well with all skilled PT intervention with patient demonstrating good gait mechanics with no visible evidence of quad weakness, ability to ascend/descend 1 flight of stairs with no issue, no extensor lag with SLR, as well as overall improved functional mobility. Patient has met  all established goals and is able to perform HEP independently at this point with excellent compliance. Patient to be discharged from PT today with PT and patient both in agreement of discharge status. Will gladly see patient in the future for any other PT related needs.    Rehab Potential Excellent   PT Treatment/Interventions ADLs/Self Care Home Management;Cryotherapy;Electrical Stimulation;Moist Heat;Ultrasound;Neuromuscular re-education;Balance training;Therapeutic exercise;Therapeutic activities;Functional mobility training;Stair training;Gait training;Patient/family education;Manual techniques;Passive range of motion;Vasopneumatic Device;Taping   PT Next Visit Plan Discharge on this date   Consulted and Agree with Plan of Care Patient      Patient will benefit from skilled therapeutic intervention in order to improve the following deficits and impairments:  Abnormal gait, Decreased activity tolerance, Decreased balance, Decreased range of motion, Decreased mobility, Decreased strength, Difficulty walking, Pain, Increased edema  Visit Diagnosis: Acute pain of left knee  Stiffness of left knee, not elsewhere classified  Difficulty in walking, not elsewhere classified  Other abnormalities of gait and mobility     Problem List Patient Active Problem List   Diagnosis Date Noted  . Primary localized osteoarthritis of left knee 08/25/2016  . Primary osteoarthritis of left knee 08/08/2016  . History of endometrial cancer 08/08/2016  . Pain in joint, multiple sites 05/05/2012  . Hypertension   . Obesity   . Endometrial adenocarcinoma ([place]) 06/29/2006     Lanney Gins, PT, DPT 11/13/16 5:51 PM   PHYSICAL THERAPY DISCHARGE SUMMARY  Visits from Start of Care: 16  Current functional level related to goals / functional outcomes: See above   Remaining deficits: See above   Education / Equipment: HEP  Plan: Patient agrees to discharge.  Patient goals were met. Patient  is being discharged due to meeting the stated rehab goals.  ?????    Lanney Gins, PT, DPT 11/13/16 5:52 PM   [facility] [address]  [place], [place], [address] Phone: [phone]   Fax:  [phone]  Name: [name] MRN: [id number] Date of Birth: 08/12/1966

## 2016-11-18 ENCOUNTER — Ambulatory Visit: Payer: BLUE CROSS/BLUE SHIELD | Admitting: Physical Therapy

## 2016-11-20 ENCOUNTER — Ambulatory Visit: Payer: BLUE CROSS/BLUE SHIELD | Admitting: Physical Therapy

## 2016-11-25 ENCOUNTER — Ambulatory Visit: Payer: BLUE CROSS/BLUE SHIELD | Admitting: Physical Therapy

## 2016-11-27 ENCOUNTER — Ambulatory Visit: Payer: BLUE CROSS/BLUE SHIELD | Admitting: Physical Therapy

## 2017-01-14 DIAGNOSIS — Z9884 Bariatric surgery status: Secondary | ICD-10-CM | POA: Diagnosis not present

## 2017-01-14 DIAGNOSIS — Z6841 Body Mass Index (BMI) 40.0 and over, adult: Secondary | ICD-10-CM | POA: Diagnosis not present

## 2017-02-05 DIAGNOSIS — M1712 Unilateral primary osteoarthritis, left knee: Secondary | ICD-10-CM | POA: Diagnosis not present

## 2017-04-29 DIAGNOSIS — Z6841 Body Mass Index (BMI) 40.0 and over, adult: Secondary | ICD-10-CM | POA: Diagnosis not present

## 2017-06-23 DIAGNOSIS — H5213 Myopia, bilateral: Secondary | ICD-10-CM | POA: Diagnosis not present

## 2017-07-07 DIAGNOSIS — Z6841 Body Mass Index (BMI) 40.0 and over, adult: Secondary | ICD-10-CM | POA: Diagnosis not present

## 2017-07-07 DIAGNOSIS — E669 Obesity, unspecified: Secondary | ICD-10-CM | POA: Diagnosis not present

## 2017-07-07 DIAGNOSIS — Z713 Dietary counseling and surveillance: Secondary | ICD-10-CM | POA: Diagnosis not present

## 2017-08-25 DIAGNOSIS — Z96652 Presence of left artificial knee joint: Secondary | ICD-10-CM | POA: Diagnosis not present

## 2017-09-16 DIAGNOSIS — Z1231 Encounter for screening mammogram for malignant neoplasm of breast: Secondary | ICD-10-CM | POA: Diagnosis not present

## 2017-09-16 DIAGNOSIS — Z01419 Encounter for gynecological examination (general) (routine) without abnormal findings: Secondary | ICD-10-CM | POA: Diagnosis not present

## 2017-09-16 DIAGNOSIS — Z6841 Body Mass Index (BMI) 40.0 and over, adult: Secondary | ICD-10-CM | POA: Diagnosis not present

## 2017-09-16 DIAGNOSIS — Z13820 Encounter for screening for osteoporosis: Secondary | ICD-10-CM | POA: Diagnosis not present

## 2017-09-16 DIAGNOSIS — E2839 Other primary ovarian failure: Secondary | ICD-10-CM | POA: Diagnosis not present

## 2017-10-06 ENCOUNTER — Ambulatory Visit
Admission: RE | Admit: 2017-10-06 | Discharge: 2017-10-06 | Disposition: A | Discharge status: Home / Self Care | Payer: BLUE CROSS/BLUE SHIELD | Source: Ambulatory Visit | Attending: Family Medicine | Admitting: Family Medicine

## 2017-10-06 ENCOUNTER — Other Ambulatory Visit: Payer: Self-pay | Admitting: Family Medicine

## 2017-10-06 DIAGNOSIS — R609 Edema, unspecified: Secondary | ICD-10-CM

## 2017-10-06 DIAGNOSIS — M19071 Primary osteoarthritis, right ankle and foot: Secondary | ICD-10-CM | POA: Diagnosis not present

## 2017-12-10 ENCOUNTER — Encounter (INDEPENDENT_AMBULATORY_CARE_PROVIDER_SITE_OTHER): Payer: Self-pay

## 2017-12-28 ENCOUNTER — Encounter (INDEPENDENT_AMBULATORY_CARE_PROVIDER_SITE_OTHER): Payer: Self-pay | Admitting: Family Medicine

## 2017-12-28 ENCOUNTER — Ambulatory Visit (INDEPENDENT_AMBULATORY_CARE_PROVIDER_SITE_OTHER): Payer: BLUE CROSS/BLUE SHIELD | Admitting: Family Medicine

## 2017-12-28 VITALS — BP 119/80 | HR 73 | Temp 98.3°F | Ht 61.0 in | Wt 261.0 lb

## 2017-12-28 DIAGNOSIS — Z6841 Body Mass Index (BMI) 40.0 and over, adult: Secondary | ICD-10-CM | POA: Diagnosis not present

## 2017-12-28 DIAGNOSIS — E559 Vitamin D deficiency, unspecified: Secondary | ICD-10-CM | POA: Insufficient documentation

## 2017-12-28 DIAGNOSIS — Z9189 Other specified personal risk factors, not elsewhere classified: Secondary | ICD-10-CM

## 2017-12-28 DIAGNOSIS — Z1331 Encounter for screening for depression: Secondary | ICD-10-CM | POA: Diagnosis not present

## 2017-12-28 DIAGNOSIS — R0602 Shortness of breath: Secondary | ICD-10-CM

## 2017-12-28 DIAGNOSIS — R5383 Other fatigue: Secondary | ICD-10-CM | POA: Diagnosis not present

## 2017-12-28 DIAGNOSIS — E66813 Obesity, class 3: Secondary | ICD-10-CM

## 2017-12-28 DIAGNOSIS — Z0289 Encounter for other administrative examinations: Secondary | ICD-10-CM

## 2017-12-28 NOTE — Progress Notes (Signed)
.  Office: [phone]  /  Fax: [phone]   HPI:   Chief Complaint: OBESITY  [name] (MR# [id number]) is a 52 y.o. female who presents on 12/28/2017 for obesity evaluation and treatment. Current BMI is Body mass index is 49.32 kg/m.[name] Kitchen [name] has struggled with obesity for years and has been unsuccessful in either losing weight or maintaining long term weight loss. [name] attended our information session and states she is currently in the action stage of change and ready to dedicate time achieving and maintaining a healthier weight.   [name] is status post lap band, went from 286 lbs to 242 lbs, now 261 lbs. She had some complications with her band and had all fluid removed. She still has some restriction.  [name] states her family eats meals together she thinks her family will eat healthier with  her she struggles with family and or coworkers weight loss sabotage her desired weight loss is 71 lbs she has been heavy most of  her life she started gaining weight, always been heavy her heaviest weight ever was 302 lbs she has significant food cravings issues  she snacks frequently in the evenings she skips meals frequently she is frequently drinking liquids with calories she frequently makes poor food choices she frequently eats larger portions than normal  she struggles with emotional eating    Fatigue [name] feels her energy is lower than it should be. This has worsened with weight gain and has not worsened recently. [name] admits to daytime somnolence and  denies waking up still tired. Patient is at risk for obstructive sleep apnea. Patent has a history of symptoms of daytime fatigue. Patient generally gets 7 hours of sleep per night, and states they generally have generally restful sleep. Snoring is present. Apneic episodes are not present. Epworth Sleepiness Score is 8.  Dyspnea on exertion [name] notes increasing shortness of breath with exercising and seems to be worsening  over time with weight gain. She notes getting out of breath sooner with activity than she used to. This has not gotten worse recently. [name] denies orthopnea.  Vitamin D Deficiency [name] has a diagnosis of vitamin D deficiency. She is on OTC Vit D, status post weight loss surgery. She notes fatigue and denies nausea, vomiting or muscle weakness. No recent labs.  Depression Screen [name] Food and Mood (modified PHQ-9) score was  Depression screen PHQ 2/9 12/28/2017  Decreased Interest 1  Down, Depressed, Hopeless 0  PHQ - 2 Score 1  Altered sleeping 0  Tired, decreased energy 1  Change in appetite 1  Feeling bad or failure about yourself  0  Trouble concentrating 0  Moving slowly or fidgety/restless 0  Suicidal thoughts 0  PHQ-9 Score 3  Difficult doing work/chores Not difficult at all   At risk for cardiovascular disease Orma is at a higher than average risk for cardiovascular disease due to obesity. She currently denies any chest pain.  ALLERGIES: Allergies  Allergen Reactions  . No Known Allergies     MEDICATIONS: Current Outpatient Medications on File Prior to Visit  Medication Sig Dispense Refill  . acetaminophen (TYLENOL) 325 MG tablet Take 2 tablets (650 mg total) by mouth every 6 (six) hours as needed for mild pain (or Fever >/= 101).    [name] Kitchen amoxicillin (AMOXIL) 500 MG capsule Take 500 mg by mouth. Take 4 capsule by mouth prior to dental procedure    . aspirin EC 81 MG tablet Take 81 mg by mouth daily.    [name] Kitchen  aspirin-acetaminophen-caffeine (EXCEDRIN MIGRAINE) 250-250-65 MG tablet Take by mouth every 6 (six) hours as needed for headache.    . Calcium 600-200 MG-UNIT tablet Take 1 tablet by mouth daily.    . Multiple Vitamins-Minerals (MULTIVITAMIN WITH MINERALS) tablet Take 1 tablet by mouth daily.    . naproxen sodium (ALEVE) 220 MG tablet Take 220 mg by mouth daily as needed.    . triamterene-hydrochlorothiazide (MAXZIDE-25) 37.5-25 MG per tablet Take 1 tablet by  mouth Daily.     No current facility-administered medications on file prior to visit.     PAST MEDICAL HISTORY: Past Medical History:  Diagnosis Date  . Arthritis   . Back pain   . 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 Hypertension   . Joint pain   . Leg edema   . Obesity    S/P lap band 2010  . Vitamin D deficiency     PAST SURGICAL HISTORY: Past Surgical History:  Procedure Laterality Date  . ABDOMINAL HYSTERECTOMY     s/p endometrial adenocarcinoma  . LAPAROSCOPIC GASTRIC BANDING  04/17/2009  . TOTAL KNEE ARTHROPLASTY Left 08/25/2016   Procedure: TOTAL KNEE ARTHROPLASTY;  Surgeon: Elsie Saas, MD;  Location: [facility];  Service: Orthopedics;  Laterality: Left;    SOCIAL HISTORY: Social History   Tobacco Use  . Smoking status: Never Smoker  . Smokeless tobacco: Never Used  Substance Use Topics  . Alcohol use: No  . Drug use: No    FAMILY HISTORY: Family History  Problem Relation Age of Onset  . Hypertension Mother   . Kidney disease Mother   . Hypertension Sister   . Hypertension Maternal Grandmother     ROS: Review of Systems  Constitutional: Positive for malaise/fatigue. Negative for weight loss.  HENT: Positive for tinnitus.        + Decreased hearing  Eyes:       + Wear glasses or contacts  Respiratory: Positive for shortness of breath (with exertion).   Cardiovascular: Negative for chest pain and orthopnea.       + Leg cramping  Gastrointestinal: Negative for nausea and vomiting.  Musculoskeletal:       Negative muscle weakness + Muscle or joint pain  Neurological: Positive for headaches.  Psychiatric/Behavioral: Positive for depression. Negative for suicidal ideas.    PHYSICAL EXAM: Blood pressure 119/80, pulse 73, temperature 98.3 F (36.8 C), temperature source Oral, height 5\' 1"  (1.549 m), weight 261 lb (118.4 kg), SpO2 100 %. Body mass index is 49.32 kg/m. Physical Exam  Constitutional: She is oriented to person, place, and time.  She appears well-developed and well-nourished.  HENT:  Head: Normocephalic and atraumatic.  Nose: Nose normal.  Eyes: EOM are normal. No scleral icterus.  Neck: Normal range of motion. Neck supple. No thyromegaly present.  Cardiovascular: Normal rate and regular rhythm.  Pulmonary/Chest: Effort normal. No respiratory distress.  Abdominal: Soft. There is no tenderness.  + Obesity  Musculoskeletal:  Range of Motion normal in all 4 extremities Trace edema noted in bilateral lower extremities  Neurological: She is alert and oriented to person, place, and time. Coordination normal.  Skin: Skin is warm and dry.  Psychiatric: She has a normal mood and affect. Her behavior is normal.  Vitals reviewed.   RECENT LABS AND TESTS: BMET    Component Value Date/Time   NA 140 08/27/2016 0505   K 4.5 08/27/2016 0505   CL 105 08/27/2016 0505   CO2 27 08/27/2016 0505   GLUCOSE 125 (H) 08/27/2016 0505  BUN 16 08/27/2016 0505   CREATININE 0.80 08/27/2016 0505   CALCIUM 9.1 08/27/2016 0505   GFRNONAA >60 08/27/2016 0505   GFRAA >60 08/27/2016 0505   Lab Results  Component Value Date   HGBA1C 5.7 (H) 08/15/2016   No results found for: INSULIN CBC    Component Value Date/Time   WBC 14.6 (H) 08/27/2016 0505   RBC 4.04 08/27/2016 0505   HGB 10.6 (L) 08/27/2016 0505   HCT 32.9 (L) 08/27/2016 0505   PLT 247 08/27/2016 0505   MCV 81.4 08/27/2016 0505   MCH 26.2 08/27/2016 0505   MCHC 32.2 08/27/2016 0505   RDW 13.9 08/27/2016 0505   Iron/TIBC/Ferritin/ %Sat No results found for: IRON, TIBC, FERRITIN, IRONPCTSAT Lipid Panel  No results found for: CHOL, TRIG, HDL, CHOLHDL, VLDL, LDLCALC, LDLDIRECT Hepatic Function Panel  No results found for: PROT, ALBUMIN, AST, ALT, ALKPHOS, BILITOT, BILIDIR, IBILI No results found for: TSH Vitamin D No recent labs  ECG  shows NSR with a rate of 61 BPM INDIRECT CALORIMETER done today shows a VO2 of 150 and a REE of 1045. Her calculated basal  metabolic rate is 0932 thus her basal metabolic rate is worse than expected.    ASSESSMENT AND PLAN: Other fatigue - Plan: EKG 12-Lead, Vitamin B12, CBC With Differential, Comprehensive metabolic panel, Folate, Hemoglobin A1c, Insulin, random, Lipid Panel With LDL/HDL Ratio, T3, T4, free, TSH  Shortness of breath on exertion - Plan: CBC With Differential  Vitamin D deficiency - Plan: VITAMIN D 25 Hydroxy (Vit-D Deficiency, Fractures)  Depression screening  At risk for heart disease  Class 3 severe obesity with serious comorbidity and body mass index (BMI) of 45.0 to 49.9 in adult, unspecified obesity type (HCC)  PLAN:  Fatigue [name] was informed that her fatigue may be related to obesity, depression or many other causes. Labs will be ordered, and in the meanwhile [name] has agreed to work on diet, exercise and weight loss to help with fatigue. Proper sleep hygiene was discussed including the need for 7-8 hours of quality sleep each night. A sleep study was not ordered based on symptoms and Epworth score.  Dyspnea on exertion Andjela's shortness of breath appears to be obesity related and exercise induced. She has agreed to work on weight loss and gradually increase exercise to treat her exercise induced shortness of breath. If [name] follows our instructions and loses weight without improvement of her shortness of breath, we will plan to refer to pulmonology. We will monitor this condition regularly. [name] agrees to this plan.  Vitamin D Deficiency [name] was informed that low vitamin D levels contributes to fatigue and are associated with obesity, breast, and colon cancer. [name] agrees to continue taking OTC Vit D as is for now and will follow up for routine testing of vitamin D, at least 2-3 times per year. She was informed of the risk of over-replacement of vitamin D and agrees to not increase her dose unless she discusses this with [name] first. We will check labs and [name] agrees to  follow up with our clinic in 2 weeks.  Depression Screen [name] had a negative depression screening. Depression is commonly associated with obesity and often results in emotional eating behaviors. We will monitor this closely and work on CBT to help improve the non-hunger eating patterns. Referral to Psychology may be required if no improvement is seen as she continues in our clinic.  Cardiovascular risk counselling [name] was given extended (15 minutes) coronary artery disease prevention  counseling today. She is 52 y.o. female and has risk factors for heart disease including obesity. We discussed intensive lifestyle modifications today with an emphasis on specific weight loss instructions and strategies. Pt was also informed of the importance of increasing exercise and decreasing saturated fats to help prevent heart disease.  Obesity [name] is currently in the action stage of change and her goal is to continue with weight loss efforts She has agreed to follow the Category 2 plan [name] has been instructed to work up to a goal of 150 minutes of combined cardio and strengthening exercise per week for weight loss and overall health benefits. We discussed the following Behavioral Modification Strategies today: increasing lean protein intake, decreasing simple carbohydrates, and no skipping meals   [name] has agreed to follow up with our clinic in 2 weeks. She was informed of the importance of frequent follow up visits to maximize her success with intensive lifestyle modifications for her multiple health conditions. She was informed we would discuss her lab results at her next visit unless there is a critical issue that needs to be addressed sooner. [name] agreed to keep her next visit at the agreed upon time to discuss these results.    OBESITY BEHAVIORAL INTERVENTION VISIT  Today's visit was # 1 out of 22.  Starting weight: 261 lbs Starting date: 12/28/17 Today's weight : 261 lbs  Today's date:  12/28/2017 Total lbs lost to date: 0 (Patients must lose 7 lbs in the first 6 months to continue with counseling)   ASK: We discussed the diagnosis of obesity with [name] Chambers today and Amore agreed to give [name] permission to discuss obesity behavioral modification therapy today.  ASSESS: Chanler has the diagnosis of obesity and her BMI today is 49.34 Kenslee is in the action stage of change   ADVISE: Zanae was educated on the multiple health risks of obesity as well as the benefit of weight loss to improve her health. She was advised of the need for long term treatment and the importance of lifestyle modifications.  AGREE: Multiple dietary modification options and treatment options were discussed and  Joselynne agreed to the above obesity treatment plan.   I, Trixie Dredge, am acting as transcriptionist for Dennard Nip, MD    I have reviewed the above documentation for accuracy and completeness, and I agree with the above. -Dennard Nip, MD

## 2017-12-29 LAB — VITAMIN B12: Vitamin B-12: 385 pg/mL (ref 232–1245)

## 2017-12-29 LAB — COMPREHENSIVE METABOLIC PANEL
ALBUMIN: 4.2 g/dL (ref 3.5–5.5)
ALT: 18 IU/L (ref 0–32)
AST: 28 IU/L (ref 0–40)
Albumin/Globulin Ratio: 1.6 (ref 1.2–2.2)
Alkaline Phosphatase: 83 IU/L (ref 39–117)
BILIRUBIN TOTAL: 0.5 mg/dL (ref 0.0–1.2)
BUN/Creatinine Ratio: 15 (ref 9–23)
BUN: 13 mg/dL (ref 6–24)
CALCIUM: 9.7 mg/dL (ref 8.7–10.2)
CHLORIDE: 103 mmol/L (ref 96–106)
CO2: 25 mmol/L (ref 20–29)
CREATININE: 0.84 mg/dL (ref 0.57–1.00)
GFR calc non Af Amer: 80 mL/min/{1.73_m2} (ref >59)
GFR, EST AFRICAN AMERICAN: 92 mL/min/{1.73_m2} (ref >59)
GLUCOSE: 88 mg/dL (ref 65–99)
Globulin, Total: 2.7 g/dL (ref 1.5–4.5)
Potassium: 4.3 mmol/L (ref 3.5–5.2)
Sodium: 143 mmol/L (ref 134–144)
Total Protein: 6.9 g/dL (ref 6.0–8.5)

## 2017-12-29 LAB — CBC WITH DIFFERENTIAL
Basophils Absolute: 0 10*3/uL (ref 0.0–0.2)
Basos: 0 %
EOS (ABSOLUTE): 0.2 10*3/uL (ref 0.0–0.4)
Eos: 3 %
HEMOGLOBIN: 12.7 g/dL (ref 11.1–15.9)
Hematocrit: 39.2 % (ref 34.0–46.6)
IMMATURE GRANS (ABS): 0 10*3/uL (ref 0.0–0.1)
IMMATURE GRANULOCYTES: 0 %
LYMPHS: 16 %
Lymphocytes Absolute: 0.9 10*3/uL (ref 0.7–3.1)
MCH: 25.9 pg — AB (ref 26.6–33.0)
MCHC: 32.4 g/dL (ref 31.5–35.7)
MCV: 80 fL (ref 79–97)
MONOCYTES: 7 %
Monocytes Absolute: 0.4 10*3/uL (ref 0.1–0.9)
Neutrophils Absolute: 4.2 10*3/uL (ref 1.4–7.0)
Neutrophils: 74 %
RBC: 4.91 x10E6/uL (ref 3.77–5.28)
RDW: 15.9 % — ABNORMAL HIGH (ref 12.3–15.4)
WBC: 5.6 10*3/uL (ref 3.4–10.8)

## 2017-12-29 LAB — T4, FREE: FREE T4: 1.15 ng/dL (ref 0.82–1.77)

## 2017-12-29 LAB — LIPID PANEL WITH LDL/HDL RATIO
CHOLESTEROL TOTAL: 179 mg/dL (ref 100–199)
HDL: 60 mg/dL (ref >39)
LDL Calculated: 102 mg/dL — ABNORMAL HIGH (ref 0–99)
LDl/HDL Ratio: 1.7 ratio (ref 0.0–3.2)
Triglycerides: 86 mg/dL (ref 0–149)
VLDL Cholesterol Cal: 17 mg/dL (ref 5–40)

## 2017-12-29 LAB — TSH: TSH: 3.13 u[IU]/mL (ref 0.450–4.500)

## 2017-12-29 LAB — HEMOGLOBIN A1C
ESTIMATED AVERAGE GLUCOSE: 117 mg/dL
Hgb A1c MFr Bld: 5.7 % — ABNORMAL HIGH (ref 4.8–5.6)

## 2017-12-29 LAB — FOLATE: Folate: 15.2 ng/mL (ref >3.0)

## 2017-12-29 LAB — T3: T3 TOTAL: 119 ng/dL (ref 71–180)

## 2017-12-29 LAB — VITAMIN D 25 HYDROXY (VIT D DEFICIENCY, FRACTURES): VIT D 25 HYDROXY: 17 ng/mL — AB (ref 30.0–100.0)

## 2017-12-29 LAB — INSULIN, RANDOM: INSULIN: 14 u[IU]/mL (ref 2.6–24.9)

## 2018-01-12 ENCOUNTER — Encounter (INDEPENDENT_AMBULATORY_CARE_PROVIDER_SITE_OTHER): Payer: Self-pay

## 2018-01-12 ENCOUNTER — Ambulatory Visit (INDEPENDENT_AMBULATORY_CARE_PROVIDER_SITE_OTHER): Payer: BLUE CROSS/BLUE SHIELD | Admitting: Family Medicine

## 2018-01-25 ENCOUNTER — Ambulatory Visit (INDEPENDENT_AMBULATORY_CARE_PROVIDER_SITE_OTHER): Payer: BLUE CROSS/BLUE SHIELD | Admitting: Family Medicine

## 2018-01-25 VITALS — BP 126/83 | HR 60 | Temp 98.3°F | Ht 61.0 in | Wt 256.0 lb

## 2018-01-25 DIAGNOSIS — E559 Vitamin D deficiency, unspecified: Secondary | ICD-10-CM | POA: Diagnosis not present

## 2018-01-25 DIAGNOSIS — Z9189 Other specified personal risk factors, not elsewhere classified: Secondary | ICD-10-CM

## 2018-01-25 DIAGNOSIS — R7303 Prediabetes: Secondary | ICD-10-CM | POA: Diagnosis not present

## 2018-01-25 DIAGNOSIS — Z6841 Body Mass Index (BMI) 40.0 and over, adult: Secondary | ICD-10-CM

## 2018-01-25 MED ORDER — METFORMIN HCL 500 MG PO TABS: 500.0000 mg | ORAL_TABLET | Freq: Every day | ORAL | 0 refills | Status: DC

## 2018-01-25 MED ORDER — VITAMIN D (ERGOCALCIFEROL) 1.25 MG (50000 UNIT) PO CAPS: 50000.0000 [IU] | ORAL_CAPSULE | ORAL | 0 refills | Status: DC

## 2018-01-27 NOTE — Progress Notes (Signed)
Office: [phone]  /  Fax: [phone]   HPI:   Chief Complaint: OBESITY [name] is here to discuss her progress with her obesity treatment plan. She is on the Category 2 plan and is following her eating plan approximately 75 % of the time. She states she is bike riding and walking for 45 minutes 4 times per week. [name] has done well with weight loss, she struggled with decreasing eating out. Hunger was controlled but cravings were an issue. She also found it difficult to eat all of her protein.  Her weight is 256 lb (116.1 kg) today and has had a weight loss of 5 pounds over a period of 4 weeks since her last visit. She has lost 5 lbs since starting treatment with [name].  Vitamin D Deficiency [name] has a new diagnosis of vitamin D deficiency. She is on multivitamin, she notes fatigue and denies nausea, vomiting or muscle weakness.  Pre-Diabetes [name] has a new diagnosis of pre-diabetes based on her elevated Hgb A1c and was informed this puts her at greater risk of developing diabetes. She notes polyphagia, worse in the afternoon and evenings. She is doing well on diet prescription overall. She is not taking metformin currently and continues to work on diet and exercise to decrease risk of diabetes. She denies nausea or hypoglycemia.  At risk for diabetes [name] is at higher than average risk for developing diabetes due to her obesity and pre-diabetes. She currently denies polyuria or polydipsia.  ALLERGIES: Allergies  Allergen Reactions  . No Known Allergies     MEDICATIONS: Current Outpatient Medications on File Prior to Visit  Medication Sig Dispense Refill  . acetaminophen (TYLENOL) 325 MG tablet Take 2 tablets (650 mg total) by mouth every 6 (six) hours as needed for mild pain (or Fever >/= 101).    [name] Kitchen amoxicillin (AMOXIL) 500 MG capsule Take 500 mg by mouth. Take 4 capsule by mouth prior to dental procedure    . aspirin EC 81 MG tablet Take 81 mg by mouth daily.    [name] Kitchen  aspirin-acetaminophen-caffeine (EXCEDRIN MIGRAINE) 250-250-65 MG tablet Take by mouth every 6 (six) hours as needed for headache.    . Calcium 600-200 MG-UNIT tablet Take 1 tablet by mouth daily.    . Multiple Vitamins-Minerals (MULTIVITAMIN WITH MINERALS) tablet Take 1 tablet by mouth daily.    . naproxen sodium (ALEVE) 220 MG tablet Take 220 mg by mouth daily as needed.    . triamterene-hydrochlorothiazide (MAXZIDE-25) 37.5-25 MG per tablet Take 1 tablet by mouth Daily.     No current facility-administered medications on file prior to visit.     PAST MEDICAL HISTORY: Past Medical History:  Diagnosis Date  . Arthritis   . Back pain   . 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 Hypertension   . Joint pain   . Leg edema   . Obesity    S/P lap band 2010  . Vitamin D deficiency     PAST SURGICAL HISTORY: Past Surgical History:  Procedure Laterality Date  . ABDOMINAL HYSTERECTOMY     s/p endometrial adenocarcinoma  . LAPAROSCOPIC GASTRIC BANDING  04/17/2009  . TOTAL KNEE ARTHROPLASTY Left 08/25/2016   Procedure: TOTAL KNEE ARTHROPLASTY;  Surgeon: Elsie Saas, MD;  Location: [facility];  Service: Orthopedics;  Laterality: Left;    SOCIAL HISTORY: Social History   Tobacco Use  . Smoking status: Never Smoker  . Smokeless tobacco: Never Used  Substance Use Topics  . Alcohol use: No  . Drug  use: No    FAMILY HISTORY: Family History  Problem Relation Age of Onset  . Hypertension Mother   . Kidney disease Mother   . Hypertension Sister   . Hypertension Maternal Grandmother     ROS: Review of Systems  Constitutional: Positive for malaise/fatigue and weight loss.  Gastrointestinal: Negative for nausea and vomiting.  Genitourinary: Negative for frequency.  Musculoskeletal:       Negative muscle weakness  Endo/Heme/Allergies: Negative for polydipsia.       Positive polyphagia Negative hypoglycemia    PHYSICAL EXAM: Blood pressure 126/83, pulse 60, temperature 98.3 F  (36.8 C), temperature source Oral, height 5\' 1"  (1.549 m), weight 256 lb (116.1 kg), SpO2 100 %. Body mass index is 48.37 kg/m. Physical Exam  Constitutional: She is oriented to person, place, and time. She appears well-developed and well-nourished.  Cardiovascular: Normal rate.  Pulmonary/Chest: Effort normal.  Musculoskeletal: Normal range of motion.  Neurological: She is oriented to person, place, and time.  Skin: Skin is warm and dry.  Psychiatric: She has a normal mood and affect. Her behavior is normal.  Vitals reviewed.   RECENT LABS AND TESTS: BMET    Component Value Date/Time   NA 143 12/28/2017 1006   K 4.3 12/28/2017 1006   CL 103 12/28/2017 1006   CO2 25 12/28/2017 1006   GLUCOSE 88 12/28/2017 1006   GLUCOSE 125 (H) 08/27/2016 0505   BUN 13 12/28/2017 1006   CREATININE 0.84 12/28/2017 1006   CALCIUM 9.7 12/28/2017 1006   GFRNONAA 80 12/28/2017 1006   GFRAA 92 12/28/2017 1006   Lab Results  Component Value Date   HGBA1C 5.7 (H) 12/28/2017   HGBA1C 5.7 (H) 08/15/2016   Lab Results  Component Value Date   INSULIN 14.0 12/28/2017   CBC    Component Value Date/Time   WBC 5.6 12/28/2017 1006   WBC 14.6 (H) 08/27/2016 0505   RBC 4.91 12/28/2017 1006   RBC 4.04 08/27/2016 0505   HGB 12.7 12/28/2017 1006   HCT 39.2 12/28/2017 1006   PLT 247 08/27/2016 0505   MCV 80 12/28/2017 1006   MCH 25.9 (L) 12/28/2017 1006   MCH 26.2 08/27/2016 0505   MCHC 32.4 12/28/2017 1006   MCHC 32.2 08/27/2016 0505   RDW 15.9 (H) 12/28/2017 1006   LYMPHSABS 0.9 12/28/2017 1006   EOSABS 0.2 12/28/2017 1006   BASOSABS 0.0 12/28/2017 1006   Iron/TIBC/Ferritin/ %Sat No results found for: IRON, TIBC, FERRITIN, IRONPCTSAT Lipid Panel     Component Value Date/Time   CHOL 179 12/28/2017 1006   TRIG 86 12/28/2017 1006   HDL 60 12/28/2017 1006   LDLCALC 102 (H) 12/28/2017 1006   Hepatic Function Panel     Component Value Date/Time   PROT 6.9 12/28/2017 1006   ALBUMIN 4.2  12/28/2017 1006   AST 28 12/28/2017 1006   ALT 18 12/28/2017 1006   ALKPHOS 83 12/28/2017 1006   BILITOT 0.5 12/28/2017 1006      Component Value Date/Time   TSH 3.130 12/28/2017 1006  Results for [name] (MRN [id number]) as of 01/27/2018 07:39  Ref. Range 12/28/2017 10:06  Vitamin D, 25-Hydroxy Latest Ref Range: 30.0 - 100.0 ng/mL 17.0 (L)    ASSESSMENT AND PLAN: Vitamin D deficiency - Plan: Vitamin D, Ergocalciferol, (DRISDOL) 50000 units CAPS capsule  Prediabetes - Plan: metFORMIN (GLUCOPHAGE) 500 MG tablet  At risk for diabetes mellitus  Class 3 severe obesity with serious comorbidity and body mass index (BMI) of  45.0 to 49.9 in adult, unspecified obesity type ([place])  PLAN:  Vitamin D Deficiency Taurus was informed that low vitamin D levels contributes to fatigue and are associated with obesity, breast, and colon cancer. Kaylena agrees to start prescription Vit D @50 ,000 IU every week #4 with no refills. She will follow up for routine testing of vitamin D, at least 2-3 times per year. She was informed of the risk of over-replacement of vitamin D and agrees to not increase her dose unless she discusses this with [name] first. We will recheck labs in 3 months and Mykayla agrees to follow up with our clinic in 2 to 3 weeks.  Pre-Diabetes Doreatha will continue to work on weight loss, exercise, and decreasing simple carbohydrates in her diet to help decrease the risk of diabetes. We dicussed metformin including benefits and risks. She was informed that eating too many simple carbohydrates or too many calories at one sitting increases the likelihood of GI side effects. Lorry agrees to start metformin 500 mg q AM #30 with no refills. We will recheck labs in 3 months and Deaisa agrees to follow up with our clinic in 2 to 3 weeks as directed to monitor her progress.  Diabetes risk counselling Delissa was given extended (15 minutes) diabetes prevention counseling today. She is 52 y.o. female  and has risk factors for diabetes including obesity and pre-diabetes. We discussed intensive lifestyle modifications today with an emphasis on weight loss as well as increasing exercise and decreasing simple carbohydrates in her diet.  Obesity Laketia is currently in the action stage of change. As such, her goal is to continue with weight loss efforts She has agreed to follow the Category 2 plan Jackquline has been instructed to work up to a goal of 150 minutes of combined cardio and strengthening exercise per week for weight loss and overall health benefits. We discussed the following Behavioral Modification Strategies today: increasing lean protein intake, decreasing simple carbohydrates, and increase H20 intake    Susette has agreed to follow up with our clinic in 2 to 3 weeks. She was informed of the importance of frequent follow up visits to maximize her success with intensive lifestyle modifications for her multiple health conditions.   OBESITY BEHAVIORAL INTERVENTION VISIT  Today's visit was # 2 out of 22.  Starting weight: 261 lbs Starting date: 12/28/17 Today's weight : 256 lbs Today's date: 01/25/2018 Total lbs lost to date: 5 (Patients must lose 7 lbs in the first 6 months to continue with counseling)   ASK: We discussed the diagnosis of obesity with [name] today and Bryndle agreed to give [name] permission to discuss obesity behavioral modification therapy today.  ASSESS: Shamera has the diagnosis of obesity and her BMI today is 48.4 Ahlivia is in the action stage of change   ADVISE: Giovannina was educated on the multiple health risks of obesity as well as the benefit of weight loss to improve her health. She was advised of the need for long term treatment and the importance of lifestyle modifications.  AGREE: Multiple dietary modification options and treatment options were discussed and  Shiron agreed to the above obesity treatment plan.  I, Trixie Dredge, am acting as  transcriptionist for Dennard Nip, MD  I have reviewed the above documentation for accuracy and completeness, and I agree with the above. -Dennard Nip, MD

## 2018-02-15 ENCOUNTER — Other Ambulatory Visit (INDEPENDENT_AMBULATORY_CARE_PROVIDER_SITE_OTHER): Payer: Self-pay | Admitting: Family Medicine

## 2018-02-15 DIAGNOSIS — E559 Vitamin D deficiency, unspecified: Secondary | ICD-10-CM

## 2018-02-16 ENCOUNTER — Ambulatory Visit (INDEPENDENT_AMBULATORY_CARE_PROVIDER_SITE_OTHER): Payer: BLUE CROSS/BLUE SHIELD | Admitting: Family Medicine

## 2018-02-16 VITALS — BP 106/72 | HR 71 | Temp 98.1°F | Ht 61.0 in | Wt 254.0 lb

## 2018-02-16 DIAGNOSIS — R7303 Prediabetes: Secondary | ICD-10-CM

## 2018-02-16 DIAGNOSIS — F3289 Other specified depressive episodes: Secondary | ICD-10-CM

## 2018-02-16 DIAGNOSIS — Z6841 Body Mass Index (BMI) 40.0 and over, adult: Secondary | ICD-10-CM

## 2018-02-16 DIAGNOSIS — Z9189 Other specified personal risk factors, not elsewhere classified: Secondary | ICD-10-CM | POA: Diagnosis not present

## 2018-02-16 MED ORDER — BUPROPION HCL ER (SR) 150 MG PO TB12: 150.0000 mg | ORAL_TABLET | Freq: Every day | ORAL | 0 refills | Status: DC

## 2018-02-17 ENCOUNTER — Other Ambulatory Visit (INDEPENDENT_AMBULATORY_CARE_PROVIDER_SITE_OTHER): Payer: Self-pay | Admitting: Family Medicine

## 2018-02-17 DIAGNOSIS — R7303 Prediabetes: Secondary | ICD-10-CM

## 2018-02-17 NOTE — Progress Notes (Signed)
Office: [phone]  /  Fax: [phone]   HPI:   Chief Complaint: OBESITY [name] is here to discuss her progress with her obesity treatment plan. She is on the Category 2 plan and is following her eating plan approximately 78 % of the time. She states she is doing water zumba for 50 minutes 2 to 3 times per week. [name] continues to do well with weight loss. She does deviate more on the weekends and is still eating out too much. Her weight is 254 lb (115.2 kg) today and has had a weight loss of 2 pounds over a period of 3 weeks since her last visit. She has lost 7 lbs since starting treatment with [name].  Pre-Diabetes [name] has a diagnosis of prediabetes based on her elevated Hgb A1c and was informed this puts her at greater risk of developing diabetes. [name] is doing well on metformin and she continues to work on diet and exercise to decrease risk of diabetes. She denies GI upset or hypoglycemia.  At risk for diabetes [name] is at higher than average risk for developing diabetes due to her obesity and pre-diabetes. She currently denies polyuria or polydipsia.  Depression with emotional eating behaviors [name] notes increased snacking and comfort eating while she is not hungry. This is worse in the last 2 to 3 weeks. [name] struggles with emotional eating and using food for comfort to the extent that it is negatively impacting her health. She often snacks when she is not hungry. [name] sometimes feels she is out of control and then feels guilty that she made poor food choices. She has been working on behavior modification techniques to help reduce her emotional eating and has been somewhat successful. She shows no sign of suicidal or homicidal ideations.  Depression screen PHQ 2/9 12/28/2017  Decreased Interest 1  Down, Depressed, Hopeless 0  PHQ - 2 Score 1  Altered sleeping 0  Tired, decreased energy 1  Change in appetite 1  Feeling bad or failure about yourself  0  Trouble  concentrating 0  Moving slowly or fidgety/restless 0  Suicidal thoughts 0  PHQ-9 Score 3  Difficult doing work/chores Not difficult at all      ALLERGIES: Allergies  Allergen Reactions  . No Known Allergies     MEDICATIONS: Current Outpatient Medications on File Prior to Visit  Medication Sig Dispense Refill  . acetaminophen (TYLENOL) 325 MG tablet Take 2 tablets (650 mg total) by mouth every 6 (six) hours as needed for mild pain (or Fever >/= 101).    [name] Kitchen amoxicillin (AMOXIL) 500 MG capsule Take 500 mg by mouth. Take 4 capsule by mouth prior to dental procedure    . aspirin EC 81 MG tablet Take 81 mg by mouth daily.    [name] Kitchen aspirin-acetaminophen-caffeine (EXCEDRIN MIGRAINE) 250-250-65 MG tablet Take by mouth every 6 (six) hours as needed for headache.    . Calcium 600-200 MG-UNIT tablet Take 1 tablet by mouth daily.    . metFORMIN (GLUCOPHAGE) 500 MG tablet Take 1 tablet (500 mg total) by mouth daily with breakfast. 30 tablet 0  . Multiple Vitamins-Minerals (MULTIVITAMIN WITH MINERALS) tablet Take 1 tablet by mouth daily.    . naproxen sodium (ALEVE) 220 MG tablet Take 220 mg by mouth daily as needed.    . triamterene-hydrochlorothiazide (MAXZIDE-25) 37.5-25 MG per tablet Take 1 tablet by mouth Daily.    . Vitamin D, Ergocalciferol, (DRISDOL) 50000 units CAPS capsule Take 1 capsule (50,000 Units total) by mouth every  7 (seven) days. 4 capsule 0   No current facility-administered medications on file prior to visit.     PAST MEDICAL HISTORY: Past Medical History:  Diagnosis Date  . Arthritis   . Back pain   . 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 Hypertension   . Joint pain   . Leg edema   . Obesity    S/P lap band 2010  . Vitamin D deficiency     PAST SURGICAL HISTORY: Past Surgical History:  Procedure Laterality Date  . ABDOMINAL HYSTERECTOMY     s/p endometrial adenocarcinoma  . LAPAROSCOPIC GASTRIC BANDING  04/17/2009  . TOTAL KNEE ARTHROPLASTY Left 08/25/2016     Procedure: TOTAL KNEE ARTHROPLASTY;  Surgeon: Elsie Saas, MD;  Location: [facility];  Service: Orthopedics;  Laterality: Left;    SOCIAL HISTORY: Social History   Tobacco Use  . Smoking status: Never Smoker  . Smokeless tobacco: Never Used  Substance Use Topics  . Alcohol use: No  . Drug use: No    FAMILY HISTORY: Family History  Problem Relation Age of Onset  . Hypertension Mother   . Kidney disease Mother   . Hypertension Sister   . Hypertension Maternal Grandmother     ROS: Review of Systems  Constitutional: Positive for weight loss.  Gastrointestinal: Negative for diarrhea, nausea and vomiting.  Genitourinary: Negative for frequency.  Endo/Heme/Allergies: Negative for polydipsia.       Negative for hypoglycemia  Psychiatric/Behavioral: Positive for depression. Negative for suicidal ideas.    PHYSICAL EXAM: Blood pressure 106/72, pulse 71, temperature 98.1 F (36.7 C), temperature source Oral, height 5\' 1"  (1.549 m), weight 254 lb (115.2 kg), SpO2 99 %. Body mass index is 47.99 kg/m. Physical Exam  Constitutional: She is oriented to person, place, and time. She appears well-developed and well-nourished.  Cardiovascular: Normal rate.  Pulmonary/Chest: Effort normal.  Musculoskeletal: Normal range of motion.  Neurological: She is oriented to person, place, and time.  Skin: Skin is warm and dry.  Vitals reviewed.   RECENT LABS AND TESTS: BMET    Component Value Date/Time   NA 143 12/28/2017 1006   K 4.3 12/28/2017 1006   CL 103 12/28/2017 1006   CO2 25 12/28/2017 1006   GLUCOSE 88 12/28/2017 1006   GLUCOSE 125 (H) 08/27/2016 0505   BUN 13 12/28/2017 1006   CREATININE 0.84 12/28/2017 1006   CALCIUM 9.7 12/28/2017 1006   GFRNONAA 80 12/28/2017 1006   GFRAA 92 12/28/2017 1006   Lab Results  Component Value Date   HGBA1C 5.7 (H) 12/28/2017   HGBA1C 5.7 (H) 08/15/2016   Lab Results  Component Value Date   INSULIN 14.0 12/28/2017   CBC     Component Value Date/Time   WBC 5.6 12/28/2017 1006   WBC 14.6 (H) 08/27/2016 0505   RBC 4.91 12/28/2017 1006   RBC 4.04 08/27/2016 0505   HGB 12.7 12/28/2017 1006   HCT 39.2 12/28/2017 1006   PLT 247 08/27/2016 0505   MCV 80 12/28/2017 1006   MCH 25.9 (L) 12/28/2017 1006   MCH 26.2 08/27/2016 0505   MCHC 32.4 12/28/2017 1006   MCHC 32.2 08/27/2016 0505   RDW 15.9 (H) 12/28/2017 1006   LYMPHSABS 0.9 12/28/2017 1006   EOSABS 0.2 12/28/2017 1006   BASOSABS 0.0 12/28/2017 1006   Iron/TIBC/Ferritin/ %Sat No results found for: IRON, TIBC, FERRITIN, IRONPCTSAT Lipid Panel     Component Value Date/Time   CHOL 179 12/28/2017 1006   TRIG 86 12/28/2017 1006  HDL 60 12/28/2017 1006   LDLCALC 102 (H) 12/28/2017 1006   Hepatic Function Panel     Component Value Date/Time   PROT 6.9 12/28/2017 1006   ALBUMIN 4.2 12/28/2017 1006   AST 28 12/28/2017 1006   ALT 18 12/28/2017 1006   ALKPHOS 83 12/28/2017 1006   BILITOT 0.5 12/28/2017 1006      Component Value Date/Time   TSH 3.130 12/28/2017 1006   Results for BRYSON, PALEN (MRN [id number]) as of 02/17/2018 16:18  Ref. Range 12/28/2017 10:06  Vitamin D, 25-Hydroxy Latest Ref Range: 30.0 - 100.0 ng/mL 17.0 (L)   ASSESSMENT AND PLAN: Prediabetes  Other depression - with emotional eating - Plan: buPROPion (WELLBUTRIN SR) 150 MG 12 hr tablet  At risk for diabetes mellitus  Class 3 severe obesity with serious comorbidity and body mass index (BMI) of 45.0 to 49.9 in adult, unspecified obesity type ([place])  PLAN:  Pre-Diabetes [name] will continue to work on weight loss, exercise, and decreasing simple carbohydrates in her diet to help decrease the risk of diabetes. We dicussed metformin including benefits and risks. She was informed that eating too many simple carbohydrates or too many calories at one sitting increases the likelihood of GI side effects. Nylene agreed to continue metformin as prescribed for now and follow up with [name]  in 2 to 3 weeks to monitor her progress.  Diabetes risk counseling [name] was given extended (15 minutes) diabetes prevention counseling today. She is 52 y.o. female and has risk factors for diabetes including obesity and pre-diabetes. We discussed intensive lifestyle modifications today with an emphasis on weight loss as well as increasing exercise and decreasing simple carbohydrates in her diet.  Depression with Emotional Eating Behaviors We discussed behavior modification techniques today to help [name] deal with her emotional eating and depression. She has agreed to start Wellbutrin SR 150 mg qAM #30 with no refills and follow up in 2 to 3 weeks.  Obesity [name] is currently in the action stage of change. As such, her goal is to continue with weight loss efforts She has agreed to follow the Category 2 plan [name] has been instructed to work up to a goal of 150 minutes of combined cardio and strengthening exercise per week for weight loss and overall health benefits. We discussed the following Behavioral Modification Strategies today: increasing lean protein intake, decreasing simple carbohydrates  and decrease eating out  [name] has agreed to follow up with our clinic in 2 to 3 weeks. She was informed of the importance of frequent follow up visits to maximize her success with intensive lifestyle modifications for her multiple health conditions.   OBESITY BEHAVIORAL INTERVENTION VISIT  Today's visit was # 3 out of 22.  Starting weight: 261 lbs Starting date: 12/28/17 Today's weight : 254 lbs Today's date: 02/17/2018 Total lbs lost to date: 7 (Patients must lose 7 lbs in the first 6 months to continue with counseling)   ASK: We discussed the diagnosis of obesity with [name] today and Jamaira agreed to give [name] permission to discuss obesity behavioral modification therapy today.  ASSESS: Damonica has the diagnosis of obesity and her BMI today is 48.02 Carolie is in the action  stage of change   ADVISE: Josiah was educated on the multiple health risks of obesity as well as the benefit of weight loss to improve her health. She was advised of the need for long term treatment and the importance of lifestyle modifications.  AGREE: Multiple dietary modification  options and treatment options were discussed and  Winni agreed to the above obesity treatment plan.  I, Doreene Nest, am acting as transcriptionist for Dennard Nip, MD  I have reviewed the above documentation for accuracy and completeness, and I agree with the above. -Dennard Nip, MD

## 2018-02-18 ENCOUNTER — Other Ambulatory Visit (INDEPENDENT_AMBULATORY_CARE_PROVIDER_SITE_OTHER): Payer: Self-pay

## 2018-02-18 DIAGNOSIS — E559 Vitamin D deficiency, unspecified: Secondary | ICD-10-CM

## 2018-02-18 MED ORDER — VITAMIN D (ERGOCALCIFEROL) 1.25 MG (50000 UNIT) PO CAPS: 50000.0000 [IU] | ORAL_CAPSULE | ORAL | 0 refills | Status: DC

## 2018-03-01 ENCOUNTER — Other Ambulatory Visit (INDEPENDENT_AMBULATORY_CARE_PROVIDER_SITE_OTHER): Payer: Self-pay | Admitting: Family Medicine

## 2018-03-01 DIAGNOSIS — R7303 Prediabetes: Secondary | ICD-10-CM

## 2018-03-02 ENCOUNTER — Ambulatory Visit (INDEPENDENT_AMBULATORY_CARE_PROVIDER_SITE_OTHER): Payer: BLUE CROSS/BLUE SHIELD | Admitting: Family Medicine

## 2018-03-02 VITALS — BP 107/72 | HR 66 | Temp 98.1°F | Ht 61.0 in | Wt 254.0 lb

## 2018-03-02 DIAGNOSIS — F3289 Other specified depressive episodes: Secondary | ICD-10-CM

## 2018-03-02 DIAGNOSIS — Z6841 Body Mass Index (BMI) 40.0 and over, adult: Secondary | ICD-10-CM

## 2018-03-02 NOTE — Progress Notes (Signed)
Office: [phone]  /  Fax: [phone]   HPI:   Chief Complaint: OBESITY [name] is here to discuss her progress with her obesity treatment plan. She is on the Category 2 plan and is following her eating plan approximately 75 % of the time. She states she is doing water zumba and weights for 45 to 60 minutes 5 times per week. [name] has done well maintaining weight, but has started to deviate from her plan more. She is getting bored with dinner and would like to discuss more options. Her weight is 254 lb (115.2 kg) today and she has maintained weight over a period of 2 weeks since her last visit. She has lost 7 lbs since starting treatment with [name].  Depression with emotional eating behaviors [name] mood is stable on Wellbutrin. She feels she is able to control emotional eating more effectively. Her blood pressure is stable and she has no insomnia. [name] struggles with emotional eating and using food for comfort to the extent that it is negatively impacting her health. She often snacks when she is not hungry. [name] sometimes feels she is out of control and then feels guilty that she made poor food choices. She has been working on behavior modification techniques to help reduce her emotional eating and has been somewhat successful. She shows no sign of suicidal or homicidal ideations.  Depression screen PHQ 2/9 12/28/2017  Decreased Interest 1  Down, Depressed, Hopeless 0  PHQ - 2 Score 1  Altered sleeping 0  Tired, decreased energy 1  Change in appetite 1  Feeling bad or failure about yourself  0  Trouble concentrating 0  Moving slowly or fidgety/restless 0  Suicidal thoughts 0  PHQ-9 Score 3  Difficult doing work/chores Not difficult at all      ALLERGIES: Allergies  Allergen Reactions  . No Known Allergies     MEDICATIONS: Current Outpatient Medications on File Prior to Visit  Medication Sig Dispense Refill  . acetaminophen (TYLENOL) 325 MG tablet Take 2 tablets (650  mg total) by mouth every 6 (six) hours as needed for mild pain (or Fever >/= 101).    [name] Kitchen amoxicillin (AMOXIL) 500 MG capsule Take 500 mg by mouth. Take 4 capsule by mouth prior to dental procedure    . aspirin EC 81 MG tablet Take 81 mg by mouth daily.    [name] Kitchen aspirin-acetaminophen-caffeine (EXCEDRIN MIGRAINE) 250-250-65 MG tablet Take by mouth every 6 (six) hours as needed for headache.    [name] Kitchen buPROPion (WELLBUTRIN SR) 150 MG 12 hr tablet Take 1 tablet (150 mg total) by mouth daily. 30 tablet 0  . Calcium 600-200 MG-UNIT tablet Take 1 tablet by mouth daily.    . metFORMIN (GLUCOPHAGE) 500 MG tablet Take 1 tablet (500 mg total) by mouth daily with breakfast. 30 tablet 0  . Multiple Vitamins-Minerals (MULTIVITAMIN WITH MINERALS) tablet Take 1 tablet by mouth daily.    . naproxen sodium (ALEVE) 220 MG tablet Take 220 mg by mouth daily as needed.    . triamterene-hydrochlorothiazide (MAXZIDE-25) 37.5-25 MG per tablet Take 1 tablet by mouth Daily.    . Vitamin D, Ergocalciferol, (DRISDOL) 50000 units CAPS capsule Take 1 capsule (50,000 Units total) by mouth every 7 (seven) days. 4 capsule 0   No current facility-administered medications on file prior to visit.     PAST MEDICAL HISTORY: Past Medical History:  Diagnosis Date  . Arthritis   . Back pain   . 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 Hypertension   .  Joint pain   . Leg edema   . Obesity    S/P lap band 2010  . Vitamin D deficiency     PAST SURGICAL HISTORY: Past Surgical History:  Procedure Laterality Date  . ABDOMINAL HYSTERECTOMY     s/p endometrial adenocarcinoma  . LAPAROSCOPIC GASTRIC BANDING  04/17/2009  . TOTAL KNEE ARTHROPLASTY Left 08/25/2016   Procedure: TOTAL KNEE ARTHROPLASTY;  Surgeon: Elsie Saas, MD;  Location: [facility];  Service: Orthopedics;  Laterality: Left;    SOCIAL HISTORY: Social History   Tobacco Use  . Smoking status: Never Smoker  . Smokeless tobacco: Never Used  Substance Use Topics  . Alcohol  use: No  . Drug use: No    FAMILY HISTORY: Family History  Problem Relation Age of Onset  . Hypertension Mother   . Kidney disease Mother   . Hypertension Sister   . Hypertension Maternal Grandmother     ROS: Review of Systems  Constitutional: Negative for weight loss.  Psychiatric/Behavioral: Positive for depression. Negative for suicidal ideas. The patient does not have insomnia.     PHYSICAL EXAM: Blood pressure 107/72, pulse 66, temperature 98.1 F (36.7 C), temperature source Oral, height 5\' 1"  (1.549 m), weight 254 lb (115.2 kg), SpO2 99 %. Body mass index is 47.99 kg/m. Physical Exam  Constitutional: She is oriented to person, place, and time. She appears well-developed and well-nourished.  Cardiovascular: Normal rate.  Pulmonary/Chest: Effort normal.  Musculoskeletal: Normal range of motion.  Neurological: She is oriented to person, place, and time.  Skin: Skin is warm and dry.  Psychiatric: She has a normal mood and affect. Her behavior is normal.  Vitals reviewed.   RECENT LABS AND TESTS: BMET    Component Value Date/Time   NA 143 12/28/2017 1006   K 4.3 12/28/2017 1006   CL 103 12/28/2017 1006   CO2 25 12/28/2017 1006   GLUCOSE 88 12/28/2017 1006   GLUCOSE 125 (H) 08/27/2016 0505   BUN 13 12/28/2017 1006   CREATININE 0.84 12/28/2017 1006   CALCIUM 9.7 12/28/2017 1006   GFRNONAA 80 12/28/2017 1006   GFRAA 92 12/28/2017 1006   Lab Results  Component Value Date   HGBA1C 5.7 (H) 12/28/2017   HGBA1C 5.7 (H) 08/15/2016   Lab Results  Component Value Date   INSULIN 14.0 12/28/2017   CBC    Component Value Date/Time   WBC 5.6 12/28/2017 1006   WBC 14.6 (H) 08/27/2016 0505   RBC 4.91 12/28/2017 1006   RBC 4.04 08/27/2016 0505   HGB 12.7 12/28/2017 1006   HCT 39.2 12/28/2017 1006   PLT 247 08/27/2016 0505   MCV 80 12/28/2017 1006   MCH 25.9 (L) 12/28/2017 1006   MCH 26.2 08/27/2016 0505   MCHC 32.4 12/28/2017 1006   MCHC 32.2 08/27/2016 0505     RDW 15.9 (H) 12/28/2017 1006   LYMPHSABS 0.9 12/28/2017 1006   EOSABS 0.2 12/28/2017 1006   BASOSABS 0.0 12/28/2017 1006   Iron/TIBC/Ferritin/ %Sat No results found for: IRON, TIBC, FERRITIN, IRONPCTSAT Lipid Panel     Component Value Date/Time   CHOL 179 12/28/2017 1006   TRIG 86 12/28/2017 1006   HDL 60 12/28/2017 1006   LDLCALC 102 (H) 12/28/2017 1006   Hepatic Function Panel     Component Value Date/Time   PROT 6.9 12/28/2017 1006   ALBUMIN 4.2 12/28/2017 1006   AST 28 12/28/2017 1006   ALT 18 12/28/2017 1006   ALKPHOS 83 12/28/2017 1006   BILITOT  0.5 12/28/2017 1006      Component Value Date/Time   TSH 3.130 12/28/2017 1006   Results for CIRA, DEYOE (MRN [id number]) as of 03/02/2018 18:02  Ref. Range 12/28/2017 10:06  Vitamin D, 25-Hydroxy Latest Ref Range: 30.0 - 100.0 ng/mL 17.0 (L)   ASSESSMENT AND PLAN: Other depression - with emotional eating  Class 3 severe obesity with serious comorbidity and body mass index (BMI) of 45.0 to 49.9 in adult, unspecified obesity type ([facility])  PLAN:  Depression with Emotional Eating Behaviors We discussed behavior modification techniques today to help [name] deal with her emotional eating and depression. She has agreed to continue Wellbutrin SR 150 mg daily and follow up as directed.  We spent > than 50% of the 15 minute visit on the counseling as documented in the note.  Obesity [name] is currently in the action stage of change. As such, her goal is to continue with weight loss efforts She has agreed to keep a food journal with 300 to 450 calories and 30+ grams of protein at supper daily and follow the Category 2 plan [name] has been instructed to work up to a goal of 150 minutes of combined cardio and strengthening exercise per week for weight loss and overall health benefits. We discussed the following Behavioral Modification Strategies today: increasing lean protein intake and decreasing simple carbohydrates   [name]  has agreed to follow up with our clinic in 2 weeks. She was informed of the importance of frequent follow up visits to maximize her success with intensive lifestyle modifications for her multiple health conditions.   OBESITY BEHAVIORAL INTERVENTION VISIT  Today's visit was # 4 out of 22.  Starting weight: 261 lbs Starting date: 12/28/17 Today's weight : 254 lbs Today's date: 03/02/2018 Total lbs lost to date: 7 (Patients must lose 7 lbs in the first 6 months to continue with counseling)   ASK: We discussed the diagnosis of obesity with [name] today and [place] agreed to give [name] permission to discuss obesity behavioral modification therapy today.  ASSESS: Janeene has the diagnosis of obesity and her BMI today is 48.02 [name] is in the action stage of change   ADVISE: [name] was educated on the multiple health risks of obesity as well as the benefit of weight loss to improve her health. She was advised of the need for long term treatment and the importance of lifestyle modifications.  AGREE: Multiple dietary modification options and treatment options were discussed and  [name] agreed to the above obesity treatment plan.  I, Doreene Nest, am acting as transcriptionist for Dennard Nip, MD  I have reviewed the above documentation for accuracy and completeness, and I agree with the above. -Dennard Nip, MD

## 2018-03-09 ENCOUNTER — Other Ambulatory Visit (INDEPENDENT_AMBULATORY_CARE_PROVIDER_SITE_OTHER): Payer: Self-pay

## 2018-03-09 ENCOUNTER — Telehealth (INDEPENDENT_AMBULATORY_CARE_PROVIDER_SITE_OTHER): Payer: Self-pay | Admitting: Family Medicine

## 2018-03-09 ENCOUNTER — Encounter (INDEPENDENT_AMBULATORY_CARE_PROVIDER_SITE_OTHER): Payer: Self-pay

## 2018-03-09 DIAGNOSIS — R7303 Prediabetes: Secondary | ICD-10-CM

## 2018-03-09 MED ORDER — METFORMIN HCL 500 MG PO TABS
500.0000 mg | ORAL_TABLET | Freq: Every day | ORAL | 0 refills | Status: DC
Start: 2018-03-09 — End: 2018-04-27

## 2018-03-09 NOTE — Telephone Encounter (Signed)
Sent the prescription in and sent the patient a my chart message. April, [place]

## 2018-03-09 NOTE — Telephone Encounter (Signed)
Patient called, stated at her last visit on June 4, that Metformin was supposed to be called in and it's not at her pharmacy.  [facility]

## 2018-03-10 ENCOUNTER — Other Ambulatory Visit (INDEPENDENT_AMBULATORY_CARE_PROVIDER_SITE_OTHER): Payer: Self-pay | Admitting: Family Medicine

## 2018-03-10 DIAGNOSIS — F3289 Other specified depressive episodes: Secondary | ICD-10-CM

## 2018-03-15 ENCOUNTER — Other Ambulatory Visit (INDEPENDENT_AMBULATORY_CARE_PROVIDER_SITE_OTHER): Payer: Self-pay | Admitting: Family Medicine

## 2018-03-15 DIAGNOSIS — F3289 Other specified depressive episodes: Secondary | ICD-10-CM

## 2018-03-18 ENCOUNTER — Ambulatory Visit (INDEPENDENT_AMBULATORY_CARE_PROVIDER_SITE_OTHER): Payer: BLUE CROSS/BLUE SHIELD | Admitting: Family Medicine

## 2018-03-18 VITALS — BP 115/75 | HR 73 | Temp 98.4°F | Ht 61.0 in | Wt 250.0 lb

## 2018-03-18 DIAGNOSIS — Z9189 Other specified personal risk factors, not elsewhere classified: Secondary | ICD-10-CM | POA: Diagnosis not present

## 2018-03-18 DIAGNOSIS — F3289 Other specified depressive episodes: Secondary | ICD-10-CM

## 2018-03-18 DIAGNOSIS — E559 Vitamin D deficiency, unspecified: Secondary | ICD-10-CM | POA: Diagnosis not present

## 2018-03-18 DIAGNOSIS — Z6841 Body Mass Index (BMI) 40.0 and over, adult: Secondary | ICD-10-CM

## 2018-03-18 MED ORDER — BUPROPION HCL ER (SR) 150 MG PO TB12: 150.0000 mg | ORAL_TABLET | Freq: Every day | ORAL | 0 refills | Status: DC

## 2018-03-18 MED ORDER — VITAMIN D (ERGOCALCIFEROL) 1.25 MG (50000 UNIT) PO CAPS: 50000.0000 [IU] | ORAL_CAPSULE | ORAL | 0 refills | Status: DC

## 2018-03-18 NOTE — Progress Notes (Signed)
Office: [phone]  /  Fax: [phone]   HPI:   Chief Complaint: OBESITY [name] is here to discuss her progress with her obesity treatment plan. She is on the keep a food journal with 300-450 calories and 30+ grams of protein at supper daily and follow the Category 2 plan and is following her eating plan approximately 75 % of the time. She states she is doing aqua zumba and cardio for 45 minutes 5 times per week. Sharmayne feels she is over thinking meal plan but had to convince herself to increase food intake. Had some celebratory eating with graduation.  Her weight is 250 lb (113.4 kg) today and has had a weight loss of 4 pounds over a period of 2 weeks since her last visit. She has lost 11 lbs since starting treatment with [name].  Vitamin D Deficiency [name] has a diagnosis of vitamin D deficiency. She is currently taking prescription Vit D. She notes improving fatigue and denies nausea, vomiting or muscle weakness.  At risk for osteopenia and osteoporosis [name] is at higher risk of osteopenia and osteoporosis due to vitamin D deficiency.   Depression with emotional eating behaviors [name]'s blood pressure is controlled and improvement in craving control. [name] struggles with emotional eating and using food for comfort to the extent that it is negatively impacting her health. She often snacks when she is not hungry. [name] sometimes feels she is out of control and then feels guilty that she made poor food choices. She has been working on behavior modification techniques to help reduce her emotional eating and has been somewhat successful. She shows no sign of suicidal or homicidal ideations.  Depression screen PHQ 2/9 12/28/2017  Decreased Interest 1  Down, Depressed, Hopeless 0  PHQ - 2 Score 1  Altered sleeping 0  Tired, decreased energy 1  Change in appetite 1  Feeling bad or failure about yourself  0  Trouble concentrating 0  Moving slowly or fidgety/restless 0  Suicidal  thoughts 0  PHQ-9 Score 3  Difficult doing work/chores Not difficult at all    ALLERGIES: Allergies  Allergen Reactions  . No Known Allergies     MEDICATIONS: Current Outpatient Medications on File Prior to Visit  Medication Sig Dispense Refill  . acetaminophen (TYLENOL) 325 MG tablet Take 2 tablets (650 mg total) by mouth every 6 (six) hours as needed for mild pain (or Fever >/= 101).    [name] Kitchen amoxicillin (AMOXIL) 500 MG capsule Take 500 mg by mouth. Take 4 capsule by mouth prior to dental procedure    . aspirin EC 81 MG tablet Take 81 mg by mouth daily.    [name] Kitchen aspirin-acetaminophen-caffeine (EXCEDRIN MIGRAINE) 250-250-65 MG tablet Take by mouth every 6 (six) hours as needed for headache.    [name] Kitchen buPROPion (WELLBUTRIN SR) 150 MG 12 hr tablet Take 1 tablet (150 mg total) by mouth daily. 30 tablet 0  . Calcium 600-200 MG-UNIT tablet Take 1 tablet by mouth daily.    . metFORMIN (GLUCOPHAGE) 500 MG tablet Take 1 tablet (500 mg total) by mouth daily with breakfast. 30 tablet 0  . Multiple Vitamins-Minerals (MULTIVITAMIN WITH MINERALS) tablet Take 1 tablet by mouth daily.    . naproxen sodium (ALEVE) 220 MG tablet Take 220 mg by mouth daily as needed.    . triamterene-hydrochlorothiazide (MAXZIDE-25) 37.5-25 MG per tablet Take 1 tablet by mouth Daily.    . Vitamin D, Ergocalciferol, (DRISDOL) 50000 units CAPS capsule Take 1 capsule (50,000 Units total) by mouth  every 7 (seven) days. 4 capsule 0   No current facility-administered medications on file prior to visit.     PAST MEDICAL HISTORY: Past Medical History:  Diagnosis Date  . Arthritis   . Back pain   . 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 Hypertension   . Joint pain   . Leg edema   . Obesity    S/P lap band 2010  . Vitamin D deficiency     PAST SURGICAL HISTORY: Past Surgical History:  Procedure Laterality Date  . ABDOMINAL HYSTERECTOMY     s/p endometrial adenocarcinoma  . LAPAROSCOPIC GASTRIC BANDING  04/17/2009  .  TOTAL KNEE ARTHROPLASTY Left 08/25/2016   Procedure: TOTAL KNEE ARTHROPLASTY;  Surgeon: Elsie Saas, MD;  Location: [facility];  Service: Orthopedics;  Laterality: Left;    SOCIAL HISTORY: Social History   Tobacco Use  . Smoking status: Never Smoker  . Smokeless tobacco: Never Used  Substance Use Topics  . Alcohol use: No  . Drug use: No    FAMILY HISTORY: Family History  Problem Relation Age of Onset  . Hypertension Mother   . Kidney disease Mother   . Hypertension Sister   . Hypertension Maternal Grandmother     ROS: Review of Systems  Constitutional: Positive for malaise/fatigue and weight loss.  Gastrointestinal: Negative for nausea and vomiting.  Musculoskeletal:       Negative muscle weakness  Psychiatric/Behavioral: Positive for depression. Negative for suicidal ideas.    PHYSICAL EXAM: Blood pressure 115/75, pulse 73, temperature 98.4 F (36.9 C), temperature source Oral, height 5\' 1"  (1.549 m), weight 250 lb (113.4 kg), SpO2 99 %. Body mass index is 47.24 kg/m. Physical Exam  Constitutional: She is oriented to person, place, and time. She appears well-developed and well-nourished.  Cardiovascular: Normal rate.  Pulmonary/Chest: Effort normal.  Musculoskeletal: Normal range of motion.  Neurological: She is oriented to person, place, and time.  Skin: Skin is warm and dry.  Psychiatric: She has a normal mood and affect. Her behavior is normal.  Vitals reviewed.   RECENT LABS AND TESTS: BMET    Component Value Date/Time   NA 143 12/28/2017 1006   K 4.3 12/28/2017 1006   CL 103 12/28/2017 1006   CO2 25 12/28/2017 1006   GLUCOSE 88 12/28/2017 1006   GLUCOSE 125 (H) 08/27/2016 0505   BUN 13 12/28/2017 1006   CREATININE 0.84 12/28/2017 1006   CALCIUM 9.7 12/28/2017 1006   GFRNONAA 80 12/28/2017 1006   GFRAA 92 12/28/2017 1006   Lab Results  Component Value Date   HGBA1C 5.7 (H) 12/28/2017   HGBA1C 5.7 (H) 08/15/2016   Lab Results  Component Value  Date   INSULIN 14.0 12/28/2017   CBC    Component Value Date/Time   WBC 5.6 12/28/2017 1006   WBC 14.6 (H) 08/27/2016 0505   RBC 4.91 12/28/2017 1006   RBC 4.04 08/27/2016 0505   HGB 12.7 12/28/2017 1006   HCT 39.2 12/28/2017 1006   PLT 247 08/27/2016 0505   MCV 80 12/28/2017 1006   MCH 25.9 (L) 12/28/2017 1006   MCH 26.2 08/27/2016 0505   MCHC 32.4 12/28/2017 1006   MCHC 32.2 08/27/2016 0505   RDW 15.9 (H) 12/28/2017 1006   LYMPHSABS 0.9 12/28/2017 1006   EOSABS 0.2 12/28/2017 1006   BASOSABS 0.0 12/28/2017 1006   Iron/TIBC/Ferritin/ %Sat No results found for: IRON, TIBC, FERRITIN, IRONPCTSAT Lipid Panel     Component Value Date/Time   CHOL 179 12/28/2017 1006  TRIG 86 12/28/2017 1006   HDL 60 12/28/2017 1006   LDLCALC 102 (H) 12/28/2017 1006   Hepatic Function Panel     Component Value Date/Time   PROT 6.9 12/28/2017 1006   ALBUMIN 4.2 12/28/2017 1006   AST 28 12/28/2017 1006   ALT 18 12/28/2017 1006   ALKPHOS 83 12/28/2017 1006   BILITOT 0.5 12/28/2017 1006      Component Value Date/Time   TSH 3.130 12/28/2017 1006  Results for ADRYAN, DRUCKENMILLER (MRN [id number]) as of 03/18/2018 13:06  Ref. Range 12/28/2017 10:06  Vitamin D, 25-Hydroxy Latest Ref Range: 30.0 - 100.0 ng/mL 17.0 (L)    ASSESSMENT AND PLAN: Vitamin D deficiency - Plan: Vitamin D, Ergocalciferol, (DRISDOL) 50000 units CAPS capsule  Other depression - with emotional eating - Plan: buPROPion (WELLBUTRIN SR) 150 MG 12 hr tablet  At risk for osteoporosis  Class 3 severe obesity with serious comorbidity and body mass index (BMI) of 45.0 to 49.9 in adult, unspecified obesity type ([place])  PLAN:  Vitamin D Deficiency [name] was informed that low vitamin D levels contributes to fatigue and are associated with obesity, breast, and colon cancer. [name] agrees to continue taking prescription Vit D @50 ,000 IU every week #4 and we will refill for 1 month. She will follow up for routine testing of vitamin D,  at least 2-3 times per year. She was informed of the risk of over-replacement of vitamin D and agrees to not increase her dose unless she discusses this with [name] first. [name] agrees to follow up with our clinic in 2 weeks.  At risk for osteopenia and osteoporosis [name] is at risk for osteopenia and osteoporsis due to her vitamin D deficiency. She was encouraged to take her vitamin D and follow her higher calcium diet and increase strengthening exercise to help strengthen her bones and decrease her risk of osteopenia and osteoporosis.  Depression with Emotional Eating Behaviors We discussed behavior modification techniques today to help [name] deal with her emotional eating and depression. [name] agrees to continue taking bupropion 150 mg PO daily #30 and we will refill for 1 month. [name] agrees to follow up with our clinic in 2 weeks.  Obesity [name] is currently in the action stage of change. As such, her goal is to continue with weight loss efforts She has agreed to follow the Category 2 plan [name] has been instructed to work up to a goal of 150 minutes of combined cardio and strengthening exercise per week for weight loss and overall health benefits. We discussed the following Behavioral Modification Strategies today: increasing lean protein intake, increasing vegetables, work on meal planning and easy cooking plans, celebration eating strategies, better snacking choices, and planning for success    [name] has agreed to follow up with our clinic in 2 weeks. She was informed of the importance of frequent follow up visits to maximize her success with intensive lifestyle modifications for her multiple health conditions.   OBESITY BEHAVIORAL INTERVENTION VISIT  Today's visit was # 5 out of 22.  Starting weight: 261 lbs Starting date: 12/28/17 Today's weight : 250 lbs  Today's date: 03/18/2018 Total lbs lost to date: 11 (Patients must lose 7 lbs in the first 6 months to continue with  counseling)   ASK: We discussed the diagnosis of obesity with [name] today and Bailyn agreed to give [name] permission to discuss obesity behavioral modification therapy today.  ASSESS: Devra has the diagnosis of obesity and her BMI today is 47.26  Shayda is in the action stage of change   ADVISE: Shealyn was educated on the multiple health risks of obesity as well as the benefit of weight loss to improve her health. She was advised of the need for long term treatment and the importance of lifestyle modifications.  AGREE: Multiple dietary modification options and treatment options were discussed and  Keshawn agreed to the above obesity treatment plan.  I, Trixie Dredge, am acting as transcriptionist for Ilene Qua, MD  I have reviewed the above documentation for accuracy and completeness, and I agree with the above. - Ilene Qua, MD

## 2018-03-31 ENCOUNTER — Ambulatory Visit (INDEPENDENT_AMBULATORY_CARE_PROVIDER_SITE_OTHER): Payer: BLUE CROSS/BLUE SHIELD | Admitting: Family Medicine

## 2018-03-31 ENCOUNTER — Other Ambulatory Visit (INDEPENDENT_AMBULATORY_CARE_PROVIDER_SITE_OTHER): Payer: Self-pay | Admitting: Family Medicine

## 2018-03-31 VITALS — BP 111/74 | HR 65 | Temp 98.2°F | Ht 61.0 in | Wt 251.0 lb

## 2018-03-31 DIAGNOSIS — E559 Vitamin D deficiency, unspecified: Secondary | ICD-10-CM | POA: Diagnosis not present

## 2018-03-31 DIAGNOSIS — Z9189 Other specified personal risk factors, not elsewhere classified: Secondary | ICD-10-CM | POA: Diagnosis not present

## 2018-03-31 DIAGNOSIS — Z6841 Body Mass Index (BMI) 40.0 and over, adult: Secondary | ICD-10-CM

## 2018-03-31 DIAGNOSIS — R7303 Prediabetes: Secondary | ICD-10-CM

## 2018-03-31 MED ORDER — VITAMIN D (ERGOCALCIFEROL) 1.25 MG (50000 UNIT) PO CAPS
50000.0000 [IU] | ORAL_CAPSULE | ORAL | 0 refills | Status: DC
Start: 2018-03-31 — End: 2018-05-18

## 2018-04-02 NOTE — Progress Notes (Signed)
Office: [phone]  /  Fax: [phone]   HPI:   Chief Complaint: OBESITY [name] is here to discuss her progress with her obesity treatment plan. She is on the Category 2 plan and is following her eating plan approximately 75 % of the time. She states she is water aerobics and strength training for 55-60 minutes 4 times per week. [name] [name] is feeling drained energy wise. Feels she is over thinking the process, over thinking calories and struggling to eat everything she is supposed to eat.  Her weight is 251 lb (113.9 kg) today and has gained 1 pound since her last visit. She has lost 10 lbs since starting treatment with [name].  Vitamin D Deficiency [name] has a diagnosis of vitamin D deficiency. She is currently taking prescription Vit D. She notes fatigue and denies nausea, vomiting or muscle weakness.  At risk for osteopenia and osteoporosis [name] [name] is at higher risk of osteopenia and osteoporosis due to vitamin D deficiency.   Pre-Diabetes [name] has a diagnosis of pre-diabetes based on her elevated Hgb A1c and was informed this puts her at greater risk of developing diabetes. She notes carbohydrate cravings and denies GI side effects of metformin and continues to work on diet and exercise to decrease risk of diabetes. She denies nausea or hypoglycemia.  ALLERGIES: Allergies  Allergen Reactions  . No Known Allergies     MEDICATIONS: Current Outpatient Medications on File Prior to Visit  Medication Sig Dispense Refill  . acetaminophen (TYLENOL) 325 MG tablet Take 2 tablets (650 mg total) by mouth every 6 (six) hours as needed for mild pain (or Fever >/= 101).    [name] Kitchen amoxicillin (AMOXIL) 500 MG capsule Take 500 mg by mouth. Take 4 capsule by mouth prior to dental procedure    . aspirin EC 81 MG tablet Take 81 mg by mouth daily.    [name] Kitchen aspirin-acetaminophen-caffeine (EXCEDRIN MIGRAINE) 250-250-65 MG tablet Take by mouth every 6 (six) hours as needed for headache.    [name] Kitchen buPROPion  (WELLBUTRIN SR) 150 MG 12 hr tablet Take 1 tablet (150 mg total) by mouth daily. 30 tablet 0  . Calcium 600-200 MG-UNIT tablet Take 1 tablet by mouth daily.    . metFORMIN (GLUCOPHAGE) 500 MG tablet Take 1 tablet (500 mg total) by mouth daily with breakfast. 30 tablet 0  . Multiple Vitamins-Minerals (MULTIVITAMIN WITH MINERALS) tablet Take 1 tablet by mouth daily.    . naproxen sodium (ALEVE) 220 MG tablet Take 220 mg by mouth daily as needed.    . triamterene-hydrochlorothiazide (MAXZIDE-25) 37.5-25 MG per tablet Take 1 tablet by mouth Daily.     No current facility-administered medications on file prior to visit.     PAST MEDICAL HISTORY: Past Medical History:  Diagnosis Date  . Arthritis   . Back pain   . 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 Hypertension   . Joint pain   . Leg edema   . Obesity    S/P lap band 2010  . Vitamin D deficiency     PAST SURGICAL HISTORY: Past Surgical History:  Procedure Laterality Date  . ABDOMINAL HYSTERECTOMY     s/p endometrial adenocarcinoma  . LAPAROSCOPIC GASTRIC BANDING  04/17/2009  . TOTAL KNEE ARTHROPLASTY Left 08/25/2016   Procedure: TOTAL KNEE ARTHROPLASTY;  Surgeon: Elsie Saas, MD;  Location: [facility];  Service: Orthopedics;  Laterality: Left;    SOCIAL HISTORY: Social History   Tobacco Use  . Smoking status: Never Smoker  . Smokeless tobacco: Never Used  Substance Use Topics  . Alcohol use: No  . Drug use: No    FAMILY HISTORY: Family History  Problem Relation Age of Onset  . Hypertension Mother   . Kidney disease Mother   . Hypertension Sister   . Hypertension Maternal Grandmother     ROS: Review of Systems  Constitutional: Positive for malaise/fatigue. Negative for weight loss.  Gastrointestinal: Negative for nausea and vomiting.  Musculoskeletal:       Negative muscle weakness  Endo/Heme/Allergies:       Negative hypoglycemia    PHYSICAL EXAM: Blood pressure 111/74, pulse 65, temperature 98.2 F  (36.8 C), temperature source Oral, height 5\' 1"  (1.549 m), weight 251 lb (113.9 kg), SpO2 98 %. Body mass index is 47.43 kg/m. Physical Exam  Constitutional: She is oriented to person, place, and time. She appears well-developed and well-nourished.  Cardiovascular: Normal rate.  Pulmonary/Chest: Effort normal.  Musculoskeletal: Normal range of motion.  Neurological: She is oriented to person, place, and time.  Skin: Skin is warm and dry.  Psychiatric: She has a normal mood and affect. Her behavior is normal.  Vitals reviewed.   RECENT LABS AND TESTS: BMET    Component Value Date/Time   NA 143 12/28/2017 1006   K 4.3 12/28/2017 1006   CL 103 12/28/2017 1006   CO2 25 12/28/2017 1006   GLUCOSE 88 12/28/2017 1006   GLUCOSE 125 (H) 08/27/2016 0505   BUN 13 12/28/2017 1006   CREATININE 0.84 12/28/2017 1006   CALCIUM 9.7 12/28/2017 1006   GFRNONAA 80 12/28/2017 1006   GFRAA 92 12/28/2017 1006   Lab Results  Component Value Date   HGBA1C 5.7 (H) 12/28/2017   HGBA1C 5.7 (H) 08/15/2016   Lab Results  Component Value Date   INSULIN 14.0 12/28/2017   CBC    Component Value Date/Time   WBC 5.6 12/28/2017 1006   WBC 14.6 (H) 08/27/2016 0505   RBC 4.91 12/28/2017 1006   RBC 4.04 08/27/2016 0505   HGB 12.7 12/28/2017 1006   HCT 39.2 12/28/2017 1006   PLT 247 08/27/2016 0505   MCV 80 12/28/2017 1006   MCH 25.9 (L) 12/28/2017 1006   MCH 26.2 08/27/2016 0505   MCHC 32.4 12/28/2017 1006   MCHC 32.2 08/27/2016 0505   RDW 15.9 (H) 12/28/2017 1006   LYMPHSABS 0.9 12/28/2017 1006   EOSABS 0.2 12/28/2017 1006   BASOSABS 0.0 12/28/2017 1006   Iron/TIBC/Ferritin/ %Sat No results found for: IRON, TIBC, FERRITIN, IRONPCTSAT Lipid Panel     Component Value Date/Time   CHOL 179 12/28/2017 1006   TRIG 86 12/28/2017 1006   HDL 60 12/28/2017 1006   LDLCALC 102 (H) 12/28/2017 1006   Hepatic Function Panel     Component Value Date/Time   PROT 6.9 12/28/2017 1006   ALBUMIN 4.2  12/28/2017 1006   AST 28 12/28/2017 1006   ALT 18 12/28/2017 1006   ALKPHOS 83 12/28/2017 1006   BILITOT 0.5 12/28/2017 1006      Component Value Date/Time   TSH 3.130 12/28/2017 1006  Results for RYLYNN, KOBS (MRN [id number]) as of 04/02/2018 12:57  Ref. Range 12/28/2017 10:06  Vitamin D, 25-Hydroxy Latest Ref Range: 30.0 - 100.0 ng/mL 17.0 (L)    ASSESSMENT AND PLAN: Vitamin D deficiency - Plan: Vitamin D, Ergocalciferol, (DRISDOL) 50000 units CAPS capsule  Prediabetes  At risk for osteoporosis  Class 3 severe obesity with serious comorbidity and body mass index (BMI) of 45.0 to 49.9 in adult, unspecified  obesity type ([place])  PLAN:  Vitamin D Deficiency Braden was informed that low vitamin D levels contributes to fatigue and are associated with obesity, breast, and colon cancer. Reese agrees to continue taking prescription Vit D @50 ,000 IU every week #4 and we will refill for 1 month. She will follow up for routine testing of vitamin D, at least 2-3 times per year. She was informed of the risk of over-replacement of vitamin D and agrees to not increase her dose unless she discusses this with [name] first. [name] agrees to follow up with our clinic in 2 weeks.  At risk for osteopenia and osteoporosis [name] [name] is at risk for osteopenia and osteoporsis due to her vitamin D deficiency. She was encouraged to take her vitamin D and follow her higher calcium diet and increase strengthening exercise to help strengthen her bones and decrease her risk of osteopenia and osteoporosis.  Pre-Diabetes [name] will continue to work on weight loss, exercise, and decreasing simple carbohydrates in her diet to help decrease the risk of diabetes. We dicussed metformin including benefits and risks. She was informed that eating too many simple carbohydrates or too many calories at one sitting increases the likelihood of GI side effects. [name] [name] agrees to continue taking metformin, no refill needed and she agrees  to follow up with our clinic in 2 weeks as directed to monitor her progress.  Obesity [name] [name] is currently in the action stage of change. As such, her goal is to continue with weight loss efforts She has agreed to follow the Category 2 plan [name] [name] has been instructed to work up to a goal of 150 minutes of combined cardio and strengthening exercise per week for weight loss and overall health benefits. We discussed the following Behavioral Modification Strategies today: increasing lean protein intake, increasing vegetables, work on meal planning and easy cooking plans, celebration eating strategies, and planning for success    [name] has agreed to follow up with our clinic in 2 weeks. She was informed of the importance of frequent follow up visits to maximize her success with intensive lifestyle modifications for her multiple health conditions.   OBESITY BEHAVIORAL INTERVENTION VISIT  Today's visit was # 6 out of 22.  Starting weight: 261 lbs Starting date: 12/28/17 Today's weight : 251 lbs  Today's date: 03/31/2018 Total lbs lost to date: 10 (Patients must lose 7 lbs in the first 6 months to continue with counseling)   ASK: We discussed the diagnosis of obesity with [name] [name] today and [name] [name] agreed to give [name] permission to discuss obesity behavioral modification therapy today.  ASSESS: [name] has the diagnosis of obesity and her BMI today is 47.45 [name] is in the action stage of change   ADVISE: [name] [name] was educated on the multiple health risks of obesity as well as the benefit of weight loss to improve her health. She was advised of the need for long term treatment and the importance of lifestyle modifications.  AGREE: Multiple dietary modification options and treatment options were discussed and  [name] agreed to the above obesity treatment plan.  I, Trixie Dredge, am acting as transcriptionist for Ilene Qua, MD  I have reviewed the above documentation for accuracy  and completeness, and I agree with the above. - Ilene Qua, MD

## 2018-04-09 ENCOUNTER — Other Ambulatory Visit (INDEPENDENT_AMBULATORY_CARE_PROVIDER_SITE_OTHER): Payer: Self-pay | Admitting: Family Medicine

## 2018-04-09 DIAGNOSIS — R7303 Prediabetes: Secondary | ICD-10-CM

## 2018-04-21 ENCOUNTER — Ambulatory Visit (INDEPENDENT_AMBULATORY_CARE_PROVIDER_SITE_OTHER): Payer: BLUE CROSS/BLUE SHIELD | Admitting: Family Medicine

## 2018-04-21 ENCOUNTER — Encounter (INDEPENDENT_AMBULATORY_CARE_PROVIDER_SITE_OTHER): Payer: Self-pay

## 2018-04-27 ENCOUNTER — Ambulatory Visit (INDEPENDENT_AMBULATORY_CARE_PROVIDER_SITE_OTHER): Payer: BLUE CROSS/BLUE SHIELD | Admitting: Family Medicine

## 2018-04-27 VITALS — BP 109/77 | HR 78 | Temp 98.2°F | Ht 61.0 in | Wt 251.0 lb

## 2018-04-27 DIAGNOSIS — Z9189 Other specified personal risk factors, not elsewhere classified: Secondary | ICD-10-CM | POA: Diagnosis not present

## 2018-04-27 DIAGNOSIS — R7303 Prediabetes: Secondary | ICD-10-CM | POA: Diagnosis not present

## 2018-04-27 DIAGNOSIS — F3289 Other specified depressive episodes: Secondary | ICD-10-CM

## 2018-04-27 DIAGNOSIS — E66813 Obesity, class 3: Secondary | ICD-10-CM

## 2018-04-27 DIAGNOSIS — Z6841 Body Mass Index (BMI) 40.0 and over, adult: Secondary | ICD-10-CM

## 2018-04-27 MED ORDER — BUPROPION HCL ER (SR) 150 MG PO TB12: 150.0000 mg | ORAL_TABLET | Freq: Every day | ORAL | 0 refills | Status: DC

## 2018-04-27 MED ORDER — METFORMIN HCL 500 MG PO TABS: 500.0000 mg | ORAL_TABLET | Freq: Every day | ORAL | 0 refills | Status: DC

## 2018-04-28 NOTE — Progress Notes (Signed)
Office: [phone]  /  Fax: [phone]   HPI:   Chief Complaint: OBESITY [name] is here to discuss her progress with her obesity treatment plan. She is on the Category 2 plan and is following her eating plan approximately 70 % of the time. She states she is doing water aerobics and strength training 45 to 90 minutes 4 times per week. [name] did well with weight maintenance while on vacation. She practiced mindful eating. She reports not eating all of the food on the meal plan at times. She is ready to get back on track. Her weight is 251 lb (113.9 kg) today and she has maintained weight over a period of 4 weeks since her last visit. She has lost 10 lbs since starting treatment with [name].  Pre-Diabetes [name] has a diagnosis of prediabetes based on her elevated Hgb A1c and was informed this puts her at greater risk of developing diabetes. She is on metformin and denies any hypoglycemic events or polyphagia. [name] continues to work on diet and exercise to decrease risk of diabetes. She denies nausea, vomiting or diarrhea.   At risk for diabetes [name] is at higher than average risk for developing diabetes due to her obesity and pre-diabetes. She currently denies polyuria or polydipsia.  Depression with emotional eating behaviors [name] notes no emotional eating recently. [name] struggles with emotional eating and using food for comfort to the extent that it is negatively impacting her health. She often snacks when she is not hungry. [name] sometimes feels she is out of control and then feels guilty that she made poor food choices. She has been working on behavior modification techniques to help reduce her emotional eating and has been somewhat successful. She shows no sign of suicidal or homicidal ideations.  Depression screen PHQ 2/9 12/28/2017  Decreased Interest 1  Down, Depressed, Hopeless 0  PHQ - 2 Score 1  Altered sleeping 0  Tired, decreased energy 1  Change in appetite 1    Feeling bad or failure about yourself  0  Trouble concentrating 0  Moving slowly or fidgety/restless 0  Suicidal thoughts 0  PHQ-9 Score 3  Difficult doing work/chores Not difficult at all     ALLERGIES: Allergies  Allergen Reactions  . No Known Allergies     MEDICATIONS: Current Outpatient Medications on File Prior to Visit  Medication Sig Dispense Refill  . acetaminophen (TYLENOL) 325 MG tablet Take 2 tablets (650 mg total) by mouth every 6 (six) hours as needed for mild pain (or Fever >/= 101).    [name] Kitchen amoxicillin (AMOXIL) 500 MG capsule Take 500 mg by mouth. Take 4 capsule by mouth prior to dental procedure    . aspirin EC 81 MG tablet Take 81 mg by mouth daily.    [name] Kitchen aspirin-acetaminophen-caffeine (EXCEDRIN MIGRAINE) 250-250-65 MG tablet Take by mouth every 6 (six) hours as needed for headache.    . Calcium 600-200 MG-UNIT tablet Take 1 tablet by mouth daily.    . Multiple Vitamins-Minerals (MULTIVITAMIN WITH MINERALS) tablet Take 1 tablet by mouth daily.    . naproxen sodium (ALEVE) 220 MG tablet Take 220 mg by mouth daily as needed.    . triamterene-hydrochlorothiazide (MAXZIDE-25) 37.5-25 MG per tablet Take 1 tablet by mouth Daily.    . Vitamin D, Ergocalciferol, (DRISDOL) 50000 units CAPS capsule Take 1 capsule (50,000 Units total) by mouth every 7 (seven) days. 4 capsule 0   No current facility-administered medications on file prior to visit.  PAST MEDICAL HISTORY: Past Medical History:  Diagnosis Date  . Arthritis   . Back pain   . 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 Hypertension   . Joint pain   . Leg edema   . Obesity    S/P lap band 2010  . Vitamin D deficiency     PAST SURGICAL HISTORY: Past Surgical History:  Procedure Laterality Date  . ABDOMINAL HYSTERECTOMY     s/p endometrial adenocarcinoma  . LAPAROSCOPIC GASTRIC BANDING  04/17/2009  . TOTAL KNEE ARTHROPLASTY Left 08/25/2016   Procedure: TOTAL KNEE ARTHROPLASTY;  Surgeon: Elsie Saas,  MD;  Location: [facility];  Service: Orthopedics;  Laterality: Left;    SOCIAL HISTORY: Social History   Tobacco Use  . Smoking status: Never Smoker  . Smokeless tobacco: Never Used  Substance Use Topics  . Alcohol use: No  . Drug use: No    FAMILY HISTORY: Family History  Problem Relation Age of Onset  . Hypertension Mother   . Kidney disease Mother   . Hypertension Sister   . Hypertension Maternal Grandmother     ROS: Review of Systems  Constitutional: Negative for weight loss.  Gastrointestinal: Negative for diarrhea, nausea and vomiting.  Genitourinary: Negative for frequency.  Endo/Heme/Allergies: Negative for polydipsia.       Negative for hypoglycemia Negative for polyphagia    PHYSICAL EXAM: Blood pressure 109/77, pulse 78, temperature 98.2 F (36.8 C), temperature source Oral, height 5\' 1"  (1.549 m), weight 251 lb (113.9 kg), SpO2 99 %. Body mass index is 47.43 kg/m. Physical Exam  Constitutional: She is oriented to person, place, and time. She appears well-developed and well-nourished.  Cardiovascular: Normal rate.  Pulmonary/Chest: Effort normal.  Musculoskeletal: Normal range of motion.  Neurological: She is oriented to person, place, and time.  Skin: Skin is warm and dry.  Psychiatric: She has a normal mood and affect. Her behavior is normal.  Vitals reviewed.   RECENT LABS AND TESTS: BMET    Component Value Date/Time   NA 143 12/28/2017 1006   K 4.3 12/28/2017 1006   CL 103 12/28/2017 1006   CO2 25 12/28/2017 1006   GLUCOSE 88 12/28/2017 1006   GLUCOSE 125 (H) 08/27/2016 0505   BUN 13 12/28/2017 1006   CREATININE 0.84 12/28/2017 1006   CALCIUM 9.7 12/28/2017 1006   GFRNONAA 80 12/28/2017 1006   GFRAA 92 12/28/2017 1006   Lab Results  Component Value Date   HGBA1C 5.7 (H) 12/28/2017   HGBA1C 5.7 (H) 08/15/2016   Lab Results  Component Value Date   INSULIN 14.0 12/28/2017   CBC    Component Value Date/Time   WBC 5.6 12/28/2017 1006     WBC 14.6 (H) 08/27/2016 0505   RBC 4.91 12/28/2017 1006   RBC 4.04 08/27/2016 0505   HGB 12.7 12/28/2017 1006   HCT 39.2 12/28/2017 1006   PLT 247 08/27/2016 0505   MCV 80 12/28/2017 1006   MCH 25.9 (L) 12/28/2017 1006   MCH 26.2 08/27/2016 0505   MCHC 32.4 12/28/2017 1006   MCHC 32.2 08/27/2016 0505   RDW 15.9 (H) 12/28/2017 1006   LYMPHSABS 0.9 12/28/2017 1006   EOSABS 0.2 12/28/2017 1006   BASOSABS 0.0 12/28/2017 1006   Iron/TIBC/Ferritin/ %Sat No results found for: IRON, TIBC, FERRITIN, IRONPCTSAT Lipid Panel     Component Value Date/Time   CHOL 179 12/28/2017 1006   TRIG 86 12/28/2017 1006   HDL 60 12/28/2017 1006   LDLCALC 102 (H) 12/28/2017 1006  Hepatic Function Panel     Component Value Date/Time   PROT 6.9 12/28/2017 1006   ALBUMIN 4.2 12/28/2017 1006   AST 28 12/28/2017 1006   ALT 18 12/28/2017 1006   ALKPHOS 83 12/28/2017 1006   BILITOT 0.5 12/28/2017 1006      Component Value Date/Time   TSH 3.130 12/28/2017 1006   Results for Kristin, [name] (MRN [id number]) as of 04/28/2018 14:46  Ref. Range 12/28/2017 10:06  Vitamin D, 25-Hydroxy Latest Ref Range: 30.0 - 100.0 ng/mL 17.0 (L)   ASSESSMENT AND PLAN: Prediabetes - Plan: metFORMIN (GLUCOPHAGE) 500 MG tablet  Other depression - with emotional eating - Plan: buPROPion (WELLBUTRIN SR) 150 MG 12 hr tablet  At risk for diabetes mellitus  Class 3 severe obesity with serious comorbidity and body mass index (BMI) of 45.0 to 49.9 in adult, unspecified obesity type ([place])  PLAN:  Pre-Diabetes [name] will continue to work on weight loss, exercise, and decreasing simple carbohydrates in her diet to help decrease the risk of diabetes. We dicussed metformin including benefits and risks. She was informed that eating too many simple carbohydrates or too many calories at one sitting increases the likelihood of GI side effects. [name] requested metformin for now and a prescription was written today for 1 month  refill. [name] agreed to follow up with [name] as directed to monitor her progress.  Diabetes risk counseling [name] was given extended (15 minutes) diabetes prevention counseling today. She is 52 y.o. female and has risk factors for diabetes including obesity and pre-diabetes. We discussed intensive lifestyle modifications today with an emphasis on weight loss as well as increasing exercise and decreasing simple carbohydrates in her diet.  Depression with Emotional Eating Behaviors We discussed behavior modification techniques today to help Mabell deal with her emotional eating and depression. She has agreed to continue Bupropion 150 mg qd #30 with no refills and follow up as directed.  Obesity Eyvette is currently in the action stage of change. As such, her goal is to continue with weight loss efforts She has agreed to follow the Category 2 plan Jarah has been instructed to work up to a goal of 150 minutes of combined cardio and strengthening exercise per week for weight loss and overall health benefits. We discussed the following Behavioral Modification Strategies today: better snacking choices and work on meal planning and easy cooking plans  Karlye has agreed to follow up with our clinic in 3 weeks. She was informed of the importance of frequent follow up visits to maximize her success with intensive lifestyle modifications for her multiple health conditions.   OBESITY BEHAVIORAL INTERVENTION VISIT  Today's visit was # 7 out of 22.  Starting weight: 261 lbs Starting date: 12/28/17 Today's weight : 251 lbs Today's date: 04/27/2018 Total lbs lost to date: 10    ASK: We discussed the diagnosis of obesity with [name] today and Dawnielle agreed to give [name] permission to discuss obesity behavioral modification therapy today.  ASSESS: Zella has the diagnosis of obesity and her BMI today is 49.34 Negin is in the action stage of change   ADVISE: Janelly was educated on the multiple  health risks of obesity as well as the benefit of weight loss to improve her health. She was advised of the need for long term treatment and the importance of lifestyle modifications.  AGREE: Multiple dietary modification options and treatment options were discussed and  Laloni agreed to the above obesity treatment plan.  Corey Skains,  am acting as transcriptionist for Dennard Nip, MD  I have reviewed the above documentation for accuracy and completeness, and I agree with the above. -Dennard Nip, MD

## 2018-05-18 ENCOUNTER — Ambulatory Visit (INDEPENDENT_AMBULATORY_CARE_PROVIDER_SITE_OTHER): Payer: BLUE CROSS/BLUE SHIELD | Admitting: Family Medicine

## 2018-05-18 VITALS — BP 114/77 | HR 57 | Temp 97.9°F | Ht 61.0 in | Wt 246.0 lb

## 2018-05-18 DIAGNOSIS — Z9189 Other specified personal risk factors, not elsewhere classified: Secondary | ICD-10-CM

## 2018-05-18 DIAGNOSIS — R7303 Prediabetes: Secondary | ICD-10-CM | POA: Diagnosis not present

## 2018-05-18 DIAGNOSIS — E559 Vitamin D deficiency, unspecified: Secondary | ICD-10-CM | POA: Diagnosis not present

## 2018-05-18 DIAGNOSIS — Z6841 Body Mass Index (BMI) 40.0 and over, adult: Secondary | ICD-10-CM

## 2018-05-18 MED ORDER — VITAMIN D (ERGOCALCIFEROL) 1.25 MG (50000 UNIT) PO CAPS: 50000.0000 [IU] | ORAL_CAPSULE | ORAL | 0 refills | Status: DC

## 2018-05-18 NOTE — Progress Notes (Signed)
Office: [phone]  /  Fax: [phone]   HPI:   Chief Complaint: OBESITY [name] is here to discuss her progress with her obesity treatment plan. She is on the Category 2 plan and is following her eating plan approximately 75 % of the time. She states she is doing Zumba and strength training 55 to 75 minutes 3 times per week. [name] continues to do well with weight loss. She is still struggling with snacking, especially in the mid afternoons and evenings. 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 Her weight is 246 lb (111.6 kg) today and has had a weight loss of 5 pounds over a period of 3 weeks since her last visit. She has lost 15 lbs since starting treatment with [name].  Vitamin D deficiency [name] has a diagnosis of vitamin D deficiency. She is currently taking vit D and is due for labs. [name] denies nausea, vomiting or muscle weakness.  Pre-Diabetes [name] has a diagnosis of prediabetes based on her elevated Hgb A1c and was informed this puts her at greater risk of developing diabetes. She is stable on metformin and diet currently. [name] is due for labs. She continues to work on diet and exercise to decrease risk of diabetes. She denies nausea or hypoglycemia.  At risk for diabetes [name] is at higher than average risk for developing diabetes due to her obesity and prediabetes. She currently denies polyuria or polydipsia.  ALLERGIES: Allergies  Allergen Reactions  . No Known Allergies     MEDICATIONS: Current Outpatient Medications on File Prior to Visit  Medication Sig Dispense Refill  . acetaminophen (TYLENOL) 325 MG tablet Take 2 tablets (650 mg total) by mouth every 6 (six) hours as needed for mild pain (or Fever >/= 101).    [name] Kitchen amoxicillin (AMOXIL) 500 MG capsule Take 500 mg by mouth. Take 4 capsule by mouth prior to dental procedure    . aspirin EC 81 MG tablet Take 81 mg by mouth daily.    [name] Kitchen aspirin-acetaminophen-caffeine (EXCEDRIN MIGRAINE)  250-250-65 MG tablet Take by mouth every 6 (six) hours as needed for headache.    [name] Kitchen buPROPion (WELLBUTRIN SR) 150 MG 12 hr tablet Take 1 tablet (150 mg total) by mouth daily. 30 tablet 0  . Calcium 600-200 MG-UNIT tablet Take 1 tablet by mouth daily.    . metFORMIN (GLUCOPHAGE) 500 MG tablet Take 1 tablet (500 mg total) by mouth daily with breakfast. 30 tablet 0  . Multiple Vitamins-Minerals (MULTIVITAMIN WITH MINERALS) tablet Take 1 tablet by mouth daily.    . naproxen sodium (ALEVE) 220 MG tablet Take 220 mg by mouth daily as needed.    . triamterene-hydrochlorothiazide (MAXZIDE-25) 37.5-25 MG per tablet Take 1 tablet by mouth Daily.     No current facility-administered medications on file prior to visit.     PAST MEDICAL HISTORY: Past Medical History:  Diagnosis Date  . Arthritis   . Back pain   . 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 Hypertension   . Joint pain   . Leg edema   . Obesity    S/P lap band 2010  . Vitamin D deficiency     PAST SURGICAL HISTORY: Past Surgical History:  Procedure Laterality Date  . ABDOMINAL HYSTERECTOMY     s/p endometrial adenocarcinoma  . LAPAROSCOPIC GASTRIC BANDING  04/17/2009  . TOTAL KNEE ARTHROPLASTY Left 08/25/2016   Procedure: TOTAL KNEE ARTHROPLASTY;  Surgeon: Elsie Saas, MD;  Location: [facility];  Service: Orthopedics;  Laterality: Left;    SOCIAL HISTORY: Social History   Tobacco Use  . Smoking status: Never Smoker  . Smokeless tobacco: Never Used  Substance Use Topics  . Alcohol use: No  . Drug use: No    FAMILY HISTORY: Family History  Problem Relation Age of Onset  . Hypertension Mother   . Kidney disease Mother   . Hypertension Sister   . Hypertension Maternal Grandmother     ROS: Review of Systems  Constitutional: Positive for weight loss.  Gastrointestinal: Negative for nausea and vomiting.  Genitourinary: Negative for frequency.  Musculoskeletal:       Negative for muscle weakness    Endo/Heme/Allergies: Negative for polydipsia.       Negative for hypoglycemia    PHYSICAL EXAM: Blood pressure 114/77, pulse (!) 57, temperature 97.9 F (36.6 C), temperature source Oral, height 5\' 1"  (1.549 m), weight 246 lb (111.6 kg), SpO2 100 %. Body mass index is 46.48 kg/m. Physical Exam  Constitutional: She is oriented to person, place, and time. She appears well-developed and well-nourished.  Cardiovascular: Normal rate.  Pulmonary/Chest: Effort normal.  Musculoskeletal: Normal range of motion.  Neurological: She is oriented to person, place, and time.  Skin: Skin is warm and dry.  Psychiatric: She has a normal mood and affect. Her behavior is normal.  Vitals reviewed.   RECENT LABS AND TESTS: BMET    Component Value Date/Time   NA 143 12/28/2017 1006   K 4.3 12/28/2017 1006   CL 103 12/28/2017 1006   CO2 25 12/28/2017 1006   GLUCOSE 88 12/28/2017 1006   GLUCOSE 125 (H) 08/27/2016 0505   BUN 13 12/28/2017 1006   CREATININE 0.84 12/28/2017 1006   CALCIUM 9.7 12/28/2017 1006   GFRNONAA 80 12/28/2017 1006   GFRAA 92 12/28/2017 1006   Lab Results  Component Value Date   HGBA1C 5.7 (H) 12/28/2017   HGBA1C 5.7 (H) 08/15/2016   Lab Results  Component Value Date   INSULIN 14.0 12/28/2017   CBC    Component Value Date/Time   WBC 5.6 12/28/2017 1006   WBC 14.6 (H) 08/27/2016 0505   RBC 4.91 12/28/2017 1006   RBC 4.04 08/27/2016 0505   HGB 12.7 12/28/2017 1006   HCT 39.2 12/28/2017 1006   PLT 247 08/27/2016 0505   MCV 80 12/28/2017 1006   MCH 25.9 (L) 12/28/2017 1006   MCH 26.2 08/27/2016 0505   MCHC 32.4 12/28/2017 1006   MCHC 32.2 08/27/2016 0505   RDW 15.9 (H) 12/28/2017 1006   LYMPHSABS 0.9 12/28/2017 1006   EOSABS 0.2 12/28/2017 1006   BASOSABS 0.0 12/28/2017 1006   Iron/TIBC/Ferritin/ %Sat No results found for: IRON, TIBC, FERRITIN, IRONPCTSAT Lipid Panel     Component Value Date/Time   CHOL 179 12/28/2017 1006   TRIG 86 12/28/2017 1006    HDL 60 12/28/2017 1006   LDLCALC 102 (H) 12/28/2017 1006   Hepatic Function Panel     Component Value Date/Time   PROT 6.9 12/28/2017 1006   ALBUMIN 4.2 12/28/2017 1006   AST 28 12/28/2017 1006   ALT 18 12/28/2017 1006   ALKPHOS 83 12/28/2017 1006   BILITOT 0.5 12/28/2017 1006      Component Value Date/Time   TSH 3.130 12/28/2017 1006   Results for [name], [name] (MRN [id number]) as of 05/18/2018 11:31  Ref. Range 12/28/2017 10:06  Vitamin D, 25-Hydroxy Latest Ref Range: 30.0 - 100.0 ng/mL 17.0 (L)   ASSESSMENT AND PLAN: Vitamin D deficiency - Plan:  Lipid Panel With LDL/HDL Ratio, VITAMIN D 25 Hydroxy (Vit-D Deficiency, Fractures), Vitamin D, Ergocalciferol, (DRISDOL) 50000 units CAPS capsule  Prediabetes - Plan: Comprehensive metabolic panel, Hemoglobin A1c, Insulin, random  At risk for diabetes mellitus  Class 3 severe obesity with serious comorbidity and body mass index (BMI) of 45.0 to 49.9 in adult, unspecified obesity type ([place])  PLAN:  Vitamin D Deficiency [name] was informed that low vitamin D levels contributes to fatigue and are associated with obesity, breast, and colon cancer. She agrees to continue to take prescription Vit D @50 ,000 IU every week #4 with no refills. We will check labs and will follow up for routine testing of vitamin D, at least 2-3 times per year. She was informed of the risk of over-replacement of vitamin D and agrees to not increase her dose unless she discusses this with [name] first. [name] agrees to follow up as directed.  Pre-Diabetes [name] will continue to work on weight loss, exercise, and decreasing simple carbohydrates in her diet to help decrease the risk of diabetes. We dicussed metformin including benefits and risks. She was informed that eating too many simple carbohydrates or too many calories at one sitting increases the likelihood of GI side effects. [name] will continue metformin for now and a prescription was not written today. We will  check labs and [name] agreed to follow up with [name] as directed to monitor her progress.  Diabetes risk counseling [name] was given extended (15 minutes) diabetes prevention counseling today. She is 52 y.o. female and has risk factors for diabetes including obesity and prediabetes. We discussed intensive lifestyle modifications today with an emphasis on weight loss as well as increasing exercise and decreasing simple carbohydrates in her diet.  Obesity [name] is currently in the action stage of change. As such, her goal is to continue with weight loss efforts She has agreed to follow the Category 2 plan [name] has been instructed to work up to a goal of 150 minutes of combined cardio and strengthening exercise per week for weight loss and overall health benefits. We discussed the following Behavioral Modification Strategies today: increasing lean protein intake and work on meal planning and easy cooking plans We discussed ways to move around her food, to help with her new schedule.  [name] has agreed to follow up with our clinic in 2 to 3 weeks. She was informed of the importance of frequent follow up visits to maximize her success with intensive lifestyle modifications for her multiple health conditions.   OBESITY BEHAVIORAL INTERVENTION VISIT  Today's visit was # 8 out of 22.  Starting weight: 261 lbs Starting date: 12/28/17 Today's weight : 246 lbs  Today's date: 05/18/2018 Total lbs lost to date: 15    ASK: We discussed the diagnosis of obesity with [name] today and [name] agreed to give [name] permission to discuss obesity behavioral modification therapy today.  ASSESS: [name] has the diagnosis of obesity and her BMI today is 46.51 [name] is in the action stage of change   ADVISE: [name] was educated on the multiple health risks of obesity as well as the benefit of weight loss to improve her health. She was advised of the need for long term treatment and the importance of  lifestyle modifications.  AGREE: Multiple dietary modification options and treatment options were discussed and  [name] agreed to the above obesity treatment plan.  I, Doreene Nest, am acting as transcriptionist for Dennard Nip, MD  I have reviewed the above documentation for  accuracy and completeness, and I agree with the above. -Dennard Nip, MD

## 2018-05-19 ENCOUNTER — Other Ambulatory Visit (INDEPENDENT_AMBULATORY_CARE_PROVIDER_SITE_OTHER): Payer: Self-pay | Admitting: Family Medicine

## 2018-05-19 DIAGNOSIS — F3289 Other specified depressive episodes: Secondary | ICD-10-CM

## 2018-05-19 LAB — LIPID PANEL WITH LDL/HDL RATIO
Cholesterol, Total: 201 mg/dL — ABNORMAL HIGH (ref 100–199)
HDL: 55 mg/dL (ref >39)
LDL CALC: 123 mg/dL — AB (ref 0–99)
LDL/HDL RATIO: 2.2 ratio (ref 0.0–3.2)
TRIGLYCERIDES: 116 mg/dL (ref 0–149)
VLDL CHOLESTEROL CAL: 23 mg/dL (ref 5–40)

## 2018-05-19 LAB — COMPREHENSIVE METABOLIC PANEL
ALK PHOS: 77 IU/L (ref 39–117)
ALT: 14 IU/L (ref 0–32)
AST: 18 IU/L (ref 0–40)
Albumin/Globulin Ratio: 1.6 (ref 1.2–2.2)
Albumin: 4.3 g/dL (ref 3.5–5.5)
BILIRUBIN TOTAL: 0.5 mg/dL (ref 0.0–1.2)
BUN/Creatinine Ratio: 14 (ref 9–23)
BUN: 15 mg/dL (ref 6–24)
CHLORIDE: 103 mmol/L (ref 96–106)
CO2: 21 mmol/L (ref 20–29)
Calcium: 10.2 mg/dL (ref 8.7–10.2)
Creatinine, Ser: 1.11 mg/dL — ABNORMAL HIGH (ref 0.57–1.00)
GFR calc Af Amer: 66 mL/min/{1.73_m2} (ref >59)
GFR calc non Af Amer: 57 mL/min/{1.73_m2} — ABNORMAL LOW (ref >59)
GLOBULIN, TOTAL: 2.7 g/dL (ref 1.5–4.5)
Glucose: 85 mg/dL (ref 65–99)
POTASSIUM: 4.7 mmol/L (ref 3.5–5.2)
SODIUM: 142 mmol/L (ref 134–144)
Total Protein: 7 g/dL (ref 6.0–8.5)

## 2018-05-19 LAB — HEMOGLOBIN A1C
ESTIMATED AVERAGE GLUCOSE: 114 mg/dL
Hgb A1c MFr Bld: 5.6 % (ref 4.8–5.6)

## 2018-05-19 LAB — INSULIN, RANDOM: INSULIN: 14.4 u[IU]/mL (ref 2.6–24.9)

## 2018-05-19 LAB — VITAMIN D 25 HYDROXY (VIT D DEFICIENCY, FRACTURES): Vit D, 25-Hydroxy: 36.5 ng/mL (ref 30.0–100.0)

## 2018-05-23 ENCOUNTER — Other Ambulatory Visit (INDEPENDENT_AMBULATORY_CARE_PROVIDER_SITE_OTHER): Payer: Self-pay | Admitting: Family Medicine

## 2018-05-23 DIAGNOSIS — F3289 Other specified depressive episodes: Secondary | ICD-10-CM

## 2018-05-24 ENCOUNTER — Other Ambulatory Visit (INDEPENDENT_AMBULATORY_CARE_PROVIDER_SITE_OTHER): Payer: Self-pay | Admitting: Family Medicine

## 2018-05-24 DIAGNOSIS — R7303 Prediabetes: Secondary | ICD-10-CM

## 2018-05-25 ENCOUNTER — Other Ambulatory Visit (INDEPENDENT_AMBULATORY_CARE_PROVIDER_SITE_OTHER): Payer: Self-pay | Admitting: Family Medicine

## 2018-05-25 DIAGNOSIS — R7303 Prediabetes: Secondary | ICD-10-CM

## 2018-05-28 ENCOUNTER — Other Ambulatory Visit (INDEPENDENT_AMBULATORY_CARE_PROVIDER_SITE_OTHER): Payer: Self-pay | Admitting: Family Medicine

## 2018-05-28 DIAGNOSIS — F3289 Other specified depressive episodes: Secondary | ICD-10-CM

## 2018-06-07 ENCOUNTER — Other Ambulatory Visit (INDEPENDENT_AMBULATORY_CARE_PROVIDER_SITE_OTHER): Payer: Self-pay | Admitting: Family Medicine

## 2018-06-07 ENCOUNTER — Ambulatory Visit (INDEPENDENT_AMBULATORY_CARE_PROVIDER_SITE_OTHER): Payer: BLUE CROSS/BLUE SHIELD | Admitting: Family Medicine

## 2018-06-07 VITALS — BP 102/69 | HR 60 | Temp 98.3°F | Ht 61.0 in | Wt 248.0 lb

## 2018-06-07 DIAGNOSIS — Z6841 Body Mass Index (BMI) 40.0 and over, adult: Secondary | ICD-10-CM | POA: Diagnosis not present

## 2018-06-07 DIAGNOSIS — F3289 Other specified depressive episodes: Secondary | ICD-10-CM

## 2018-06-07 DIAGNOSIS — E559 Vitamin D deficiency, unspecified: Secondary | ICD-10-CM

## 2018-06-07 DIAGNOSIS — Z9189 Other specified personal risk factors, not elsewhere classified: Secondary | ICD-10-CM

## 2018-06-07 DIAGNOSIS — R7303 Prediabetes: Secondary | ICD-10-CM | POA: Diagnosis not present

## 2018-06-08 MED ORDER — VITAMIN D (ERGOCALCIFEROL) 1.25 MG (50000 UNIT) PO CAPS: 50000.0000 [IU] | ORAL_CAPSULE | ORAL | 0 refills | Status: DC

## 2018-06-08 MED ORDER — METFORMIN HCL 500 MG PO TABS: 500.0000 mg | ORAL_TABLET | Freq: Every day | ORAL | 0 refills | Status: DC

## 2018-06-08 MED ORDER — BUPROPION HCL ER (SR) 150 MG PO TB12: 150.0000 mg | ORAL_TABLET | Freq: Every day | ORAL | 0 refills | Status: DC

## 2018-06-08 NOTE — Progress Notes (Signed)
Office: [phone]  /  Fax: [phone]   HPI:   Chief Complaint: OBESITY [name] is here to discuss her progress with her obesity treatment plan. She is on the Category 2 plan and is following her eating plan approximately 78 % of the time. She states she is doing water Zumba and strength training 60 to 75 minutes 3 times per week. [name] has been off track for the last two to three weeks with increased family stress, with the death of friends and family. [name] has increased stress eating and she has decreased meal planning. Her weight is 248 lb (112.5 kg) today and has not lost weight since her last visit. She has lost 13 lbs since starting treatment with [name].  Vitamin D deficiency [name] has a diagnosis of vitamin D deficiency. [name] is stable on vit D and her level is slowly improving, but she is not yet at goal. She denies nausea, vomiting or muscle weakness.  Pre-Diabetes [name] has a diagnosis of prediabetes based on her elevated Hgb A1c and was informed this puts her at greater risk of developing diabetes. She is taking metformin currently and her A1c is slowly improving. Her insulin level is still elevated and she is doing well with her diet overall. She continues to work on diet and exercise to decrease risk of diabetes. She denies nausea or hypoglycemia.  At risk for diabetes [name] is at higher than average risk for developing diabetes due to her obesity and prediabetes. She currently denies polyuria or polydipsia.  ALLERGIES: Allergies  Allergen Reactions  . No Known Allergies     MEDICATIONS: Current Outpatient Medications on File Prior to Visit  Medication Sig Dispense Refill  . acetaminophen (TYLENOL) 325 MG tablet Take 2 tablets (650 mg total) by mouth every 6 (six) hours as needed for mild pain (or Fever >/= 101).    [name] Kitchen amoxicillin (AMOXIL) 500 MG capsule Take 500 mg by mouth. Take 4 capsule by mouth prior to dental procedure    . aspirin EC 81 MG tablet Take 81  mg by mouth daily.    [name] Kitchen aspirin-acetaminophen-caffeine (EXCEDRIN MIGRAINE) 250-250-65 MG tablet Take by mouth every 6 (six) hours as needed for headache.    . Calcium 600-200 MG-UNIT tablet Take 1 tablet by mouth daily.    . Multiple Vitamins-Minerals (MULTIVITAMIN WITH MINERALS) tablet Take 1 tablet by mouth daily.    . naproxen sodium (ALEVE) 220 MG tablet Take 220 mg by mouth daily as needed.    . triamterene-hydrochlorothiazide (MAXZIDE-25) 37.5-25 MG per tablet Take 1 tablet by mouth Daily.     No current facility-administered medications on file prior to visit.     PAST MEDICAL HISTORY: Past Medical History:  Diagnosis Date  . Arthritis   . Back pain   . 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 Hypertension   . Joint pain   . Leg edema   . Obesity    S/P lap band 2010  . Vitamin D deficiency     PAST SURGICAL HISTORY: Past Surgical History:  Procedure Laterality Date  . ABDOMINAL HYSTERECTOMY     s/p endometrial adenocarcinoma  . LAPAROSCOPIC GASTRIC BANDING  04/17/2009  . TOTAL KNEE ARTHROPLASTY Left 08/25/2016   Procedure: TOTAL KNEE ARTHROPLASTY;  Surgeon: Elsie Saas, MD;  Location: [facility];  Service: Orthopedics;  Laterality: Left;    SOCIAL HISTORY: Social History   Tobacco Use  . Smoking status: Never Smoker  . Smokeless tobacco: Never Used  Substance Use Topics  .  Alcohol use: No  . Drug use: No    FAMILY HISTORY: Family History  Problem Relation Age of Onset  . Hypertension Mother   . Kidney disease Mother   . Hypertension Sister   . Hypertension Maternal Grandmother     ROS: Review of Systems  Constitutional: Negative for weight loss.  Gastrointestinal: Negative for nausea and vomiting.  Genitourinary: Negative for frequency.  Musculoskeletal:       Negative for muscle weakness  Endo/Heme/Allergies: Negative for polydipsia.       Negative for hypoglycemia  Psychiatric/Behavioral: Positive for depression. Negative for suicidal ideas.        Positive for stress    PHYSICAL EXAM: Blood pressure 102/69, pulse 60, temperature 98.3 F (36.8 C), temperature source Oral, height 5\' 1"  (1.549 m), weight 248 lb (112.5 kg), SpO2 100 %. Body mass index is 46.86 kg/m. Physical Exam  Constitutional: She is oriented to person, place, and time. She appears well-developed and well-nourished.  Cardiovascular: Normal rate.  Pulmonary/Chest: Effort normal.  Musculoskeletal: Normal range of motion.  Neurological: She is oriented to person, place, and time.  Skin: Skin is warm and dry.  Psychiatric: She has a normal mood and affect. Her behavior is normal.  Vitals reviewed.   RECENT LABS AND TESTS: BMET    Component Value Date/Time   NA 142 05/18/2018 1014   K 4.7 05/18/2018 1014   CL 103 05/18/2018 1014   CO2 21 05/18/2018 1014   GLUCOSE 85 05/18/2018 1014   GLUCOSE 125 (H) 08/27/2016 0505   BUN 15 05/18/2018 1014   CREATININE 1.11 (H) 05/18/2018 1014   CALCIUM 10.2 05/18/2018 1014   GFRNONAA 57 (L) 05/18/2018 1014   GFRAA 66 05/18/2018 1014   Lab Results  Component Value Date   HGBA1C 5.6 05/18/2018   HGBA1C 5.7 (H) 12/28/2017   HGBA1C 5.7 (H) 08/15/2016   Lab Results  Component Value Date   INSULIN 14.4 05/18/2018   INSULIN 14.0 12/28/2017   CBC    Component Value Date/Time   WBC 5.6 12/28/2017 1006   WBC 14.6 (H) 08/27/2016 0505   RBC 4.91 12/28/2017 1006   RBC 4.04 08/27/2016 0505   HGB 12.7 12/28/2017 1006   HCT 39.2 12/28/2017 1006   PLT 247 08/27/2016 0505   MCV 80 12/28/2017 1006   MCH 25.9 (L) 12/28/2017 1006   MCH 26.2 08/27/2016 0505   MCHC 32.4 12/28/2017 1006   MCHC 32.2 08/27/2016 0505   RDW 15.9 (H) 12/28/2017 1006   LYMPHSABS 0.9 12/28/2017 1006   EOSABS 0.2 12/28/2017 1006   BASOSABS 0.0 12/28/2017 1006   Iron/TIBC/Ferritin/ %Sat No results found for: IRON, TIBC, FERRITIN, IRONPCTSAT Lipid Panel     Component Value Date/Time   CHOL 201 (H) 05/18/2018 1014   TRIG 116 05/18/2018 1014     HDL 55 05/18/2018 1014   LDLCALC 123 (H) 05/18/2018 1014   Hepatic Function Panel     Component Value Date/Time   PROT 7.0 05/18/2018 1014   ALBUMIN 4.3 05/18/2018 1014   AST 18 05/18/2018 1014   ALT 14 05/18/2018 1014   ALKPHOS 77 05/18/2018 1014   BILITOT 0.5 05/18/2018 1014      Component Value Date/Time   TSH 3.130 12/28/2017 1006   Results for LILYGRACE, RODICK (MRN [id number]) as of 06/08/2018 14:34  Ref. Range 05/18/2018 10:14  Vitamin D, 25-Hydroxy Latest Ref Range: 30.0 - 100.0 ng/mL 36.5   ASSESSMENT AND PLAN: Vitamin D deficiency - Plan: Vitamin D,  Ergocalciferol, (DRISDOL) 50000 units CAPS capsule  Prediabetes - Plan: metFORMIN (GLUCOPHAGE) 500 MG tablet  Other depression - with emotional eating - Plan: buPROPion (WELLBUTRIN SR) 150 MG 12 hr tablet  At risk for diabetes mellitus  Class 3 severe obesity with serious comorbidity and body mass index (BMI) of 45.0 to 49.9 in adult, unspecified obesity type ([place])  PLAN:  Vitamin D Deficiency Aicha was informed that low vitamin D levels contributes to fatigue and are associated with obesity, breast, and colon cancer. She agrees to continue to take prescription Vit D @50 ,000 IU every week #4 with no refills and will follow up for routine testing of vitamin D, at least 2-3 times per year. She was informed of the risk of over-replacement of vitamin D and agrees to not increase her dose unless she discusses this with [name] first. Rafaella agrees to follow up as directed.  Pre-Diabetes Brailyn will continue to work on weight loss, exercise, and decreasing simple carbohydrates in her diet to help decrease the risk of diabetes. We dicussed metformin including benefits and risks. She was informed that eating too many simple carbohydrates or too many calories at one sitting increases the likelihood of GI side effects. Analiz requested metformin for now and a prescription was written today for 1 month refill. Fujiko agreed to follow up  with [name] as directed to monitor her progress.  Diabetes risk counseling Moksha was given extended (15 minutes) diabetes prevention counseling today. She is 51 y.o. female and has risk factors for diabetes including obesity and prediabetes. We discussed intensive lifestyle modifications today with an emphasis on weight loss as well as increasing exercise and decreasing simple carbohydrates in her diet.  Obesity Siah is currently in the action stage of change. As such, her goal is to continue with weight loss efforts She has agreed to follow the Category 2 plan Kaylah has been instructed to work up to a goal of 150 minutes of combined cardio and strengthening exercise per week or continue water Zumba and strength training 60 to 75 minutes 3 times per week for weight loss and overall health benefits. We discussed the following Behavioral Modification Strategies today: no skipping meals, increasing lean protein intake, work on meal planning and easy cooking plans and emotional eating strategies  Natalyn has agreed to follow up with our clinic in 4 weeks. She was informed of the importance of frequent follow up visits to maximize her success with intensive lifestyle modifications for her multiple health conditions.   OBESITY BEHAVIORAL INTERVENTION VISIT  Today's visit was # 9   Starting weight: 261 lbs Starting date: 12/28/17 Today's weight : 248 lbs  Today's date: 06/07/2018 Total lbs lost to date: 15   ASK: We discussed the diagnosis of obesity with [name] today and Rachell agreed to give [name] permission to discuss obesity behavioral modification therapy today.  ASSESS: Lucky has the diagnosis of obesity and her BMI today is 46.88 Cady is in the action stage of change   ADVISE: Jerlean was educated on the multiple health risks of obesity as well as the benefit of weight loss to improve her health. She was advised of the need for long term treatment and the importance of lifestyle  modifications to improve her current health and to decrease her risk of future health problems.  AGREE: Multiple dietary modification options and treatment options were discussed and  Aalaya agreed to follow the recommendations documented in the above note.  ARRANGE: Isabel was educated on the  importance of frequent visits to treat obesity as outlined per CMS and USPSTF guidelines and agreed to schedule her next follow up appointment today.  I, Doreene Nest, am acting as transcriptionist for Dennard Nip, MD  I have reviewed the above documentation for accuracy and completeness, and I agree with the above. -Dennard Nip, MD

## 2018-06-23 ENCOUNTER — Ambulatory Visit (INDEPENDENT_AMBULATORY_CARE_PROVIDER_SITE_OTHER): Payer: BLUE CROSS/BLUE SHIELD | Admitting: Dietician

## 2018-06-23 VITALS — Ht 61.0 in | Wt 247.0 lb

## 2018-06-23 DIAGNOSIS — R7303 Prediabetes: Secondary | ICD-10-CM

## 2018-06-23 DIAGNOSIS — Z6841 Body Mass Index (BMI) 40.0 and over, adult: Secondary | ICD-10-CM

## 2018-06-23 DIAGNOSIS — Z9189 Other specified personal risk factors, not elsewhere classified: Secondary | ICD-10-CM | POA: Diagnosis not present

## 2018-06-23 NOTE — Progress Notes (Signed)
  Office: [phone]  /  Fax: [phone]     [name] has a diagnosis of prediabetes based on her elevated HgA1c and was informed this puts her at greater risk of developing diabetes. She is here today for diabetes risk nutrition counseling which includes her obesity treatment plan.   Her weight today is 247 lbs, a 1 lb weight loss since her last visit and a 14 lb weight loss since beginning treatment with [name].   She reports following her category 2 meal plan approximately 75% of the time. She admits she struggling to get in all of the food on the meal plan since she started taking a class at night and gets home later than usual. She states she likes the food on the meal plan and states her main obstacle is lack of planning. We discussed options to make planning an prepping her meals easier as well as adjusting the meal plan to get in all of the food during her day.   Also reviewed the importance of food nutrients ie protein, fats, simple and complex carbohydrates and how these affect insulin response and her weight.   [name] is on the following meal plan category 2 Her meal plan was individualized for maximum benefit.  Also discussed at length the following behavioral modifications to help maximize success: increasing lean protein intake,  increasing vegetables, increase water intake,  avoiding skipping meals, meal planning and cooking strategies, travel eating strategies, holiday eating strategies,  planning for success.   [name] has been instructed to work up to a goal of 150 minutes of combined cardio and strengthening exercise per week for weight loss and overall health benefits. She reports she is doing water zumba for 60 min 3 days per week. Written information was provided on adjusting her meal plan.    OBESITY BEHAVIORAL INTERVENTION VISIT  Today's visit was # 10   Starting weight: 261 Starting date: 12/28/17 Today's weight : Weight: 247 lb (112 kg)  Today's date: 06/23/2018 Total  lbs lost to date: 14 lbs At least 15 minutes were spent on discussing the following behavioral intervention visit.   ASK: We discussed the diagnosis of obesity with [name] today and [name] agreed to give [name] permission to discuss obesity behavioral modification therapy today.  ASSESS: [name] has the diagnosis of obesity and her BMI today is 18 [name] is in the action stage of change   ADVISE: [name] was educated on the multiple health risks of obesity as well as the benefit of weight loss to improve her health. She was advised of the need for long term treatment and the importance of lifestyle modifications to improve her current health and to decrease her risk of future health problems.  AGREE: Multiple dietary modification options and treatment options were discussed and  [name] agreed to follow the recommendations documented in the above note.  ARRANGE: [name] was educated on the importance of frequent visits to treat obesity as outlined per CMS and USPSTF guidelines and agreed to schedule her next follow up appointment today.

## 2018-06-30 ENCOUNTER — Other Ambulatory Visit (INDEPENDENT_AMBULATORY_CARE_PROVIDER_SITE_OTHER): Payer: Self-pay | Admitting: Family Medicine

## 2018-06-30 DIAGNOSIS — E559 Vitamin D deficiency, unspecified: Secondary | ICD-10-CM

## 2018-07-02 ENCOUNTER — Other Ambulatory Visit (INDEPENDENT_AMBULATORY_CARE_PROVIDER_SITE_OTHER): Payer: Self-pay | Admitting: Family Medicine

## 2018-07-02 DIAGNOSIS — R7303 Prediabetes: Secondary | ICD-10-CM

## 2018-07-06 ENCOUNTER — Ambulatory Visit (INDEPENDENT_AMBULATORY_CARE_PROVIDER_SITE_OTHER): Payer: BLUE CROSS/BLUE SHIELD | Admitting: Family Medicine

## 2018-07-08 DIAGNOSIS — M1711 Unilateral primary osteoarthritis, right knee: Secondary | ICD-10-CM | POA: Diagnosis not present

## 2018-07-08 DIAGNOSIS — M545 Low back pain: Secondary | ICD-10-CM | POA: Diagnosis not present

## 2018-07-08 DIAGNOSIS — Z96652 Presence of left artificial knee joint: Secondary | ICD-10-CM | POA: Diagnosis not present

## 2018-07-15 ENCOUNTER — Ambulatory Visit (INDEPENDENT_AMBULATORY_CARE_PROVIDER_SITE_OTHER): Payer: BLUE CROSS/BLUE SHIELD | Admitting: Family Medicine

## 2018-07-15 VITALS — BP 125/83 | HR 61 | Temp 98.2°F | Ht 61.0 in | Wt 246.0 lb

## 2018-07-15 DIAGNOSIS — E559 Vitamin D deficiency, unspecified: Secondary | ICD-10-CM

## 2018-07-15 DIAGNOSIS — R7303 Prediabetes: Secondary | ICD-10-CM

## 2018-07-15 DIAGNOSIS — F3289 Other specified depressive episodes: Secondary | ICD-10-CM

## 2018-07-15 DIAGNOSIS — Z6841 Body Mass Index (BMI) 40.0 and over, adult: Secondary | ICD-10-CM | POA: Diagnosis not present

## 2018-07-15 DIAGNOSIS — Z9189 Other specified personal risk factors, not elsewhere classified: Secondary | ICD-10-CM | POA: Diagnosis not present

## 2018-07-15 MED ORDER — VITAMIN D (ERGOCALCIFEROL) 1.25 MG (50000 UNIT) PO CAPS: 50000.0000 [IU] | ORAL_CAPSULE | ORAL | 0 refills | Status: DC

## 2018-07-15 MED ORDER — BUPROPION HCL ER (SR) 150 MG PO TB12: 150.0000 mg | ORAL_TABLET | Freq: Every day | ORAL | 0 refills | Status: DC

## 2018-07-15 MED ORDER — METFORMIN HCL 500 MG PO TABS: 500.0000 mg | ORAL_TABLET | Freq: Every day | ORAL | 0 refills | Status: DC

## 2018-07-20 NOTE — Progress Notes (Signed)
Office: [phone]  /  Fax: [phone]   HPI:   Chief Complaint: OBESITY [name] is here to discuss her progress with her obesity treatment plan. She is on the Category 2 plan and is following her eating plan approximately 80 % of the time. She states she is doing water aerobics, walking and strength training 60 to 90 minutes 3 times per week. [name] continues to do well with weight loss. She is working on meal planning and she is making better choices. Her weight is 246 lb (111.6 kg) today and has had a weight loss of 2 pounds over a period of 5 weeks since her last visit. She has lost 15 lbs since starting treatment with [name].  Pre-Diabetes [name] has a diagnosis of prediabetes based on her elevated Hgb A1c and was informed this puts her at greater risk of developing diabetes. [name] is stable on metformin and she is doing well with her diet. She continues to work on diet and exercise to decrease risk of diabetes. She denies nausea, vomiting or hypoglycemia.  At risk for diabetes [name] is at higher than average risk for developing diabetes due to her obesity and prediabetes. She currently denies polyuria or polydipsia.  Vitamin D deficiency [name] has a diagnosis of vitamin D deficiency. She is stable on it D and denies nausea, vomiting or muscle weakness.  Depression with emotional eating behaviors [name] mood is stable on wellbutrin and she notes a decrease in emotional eating. [name] struggles with emotional eating and using food for comfort to the extent that it is negatively impacting her health. She often snacks when she is not hungry. [name] sometimes feels she is out of control and then feels guilty that she made poor food choices. She has been working on behavior modification techniques to help reduce her emotional eating and has been somewhat successful. She denies any insomnia and she shows no sign of suicidal or homicidal ideations.  Depression screen PHQ 2/9 12/28/2017    Decreased Interest 1  Down, Depressed, Hopeless 0  PHQ - 2 Score 1  Altered sleeping 0  Tired, decreased energy 1  Change in appetite 1  Feeling bad or failure about yourself  0  Trouble concentrating 0  Moving slowly or fidgety/restless 0  Suicidal thoughts 0  PHQ-9 Score 3  Difficult doing work/chores Not difficult at all     ALLERGIES: Allergies  Allergen Reactions  . No Known Allergies     MEDICATIONS: Current Outpatient Medications on File Prior to Visit  Medication Sig Dispense Refill  . acetaminophen (TYLENOL) 325 MG tablet Take 2 tablets (650 mg total) by mouth every 6 (six) hours as needed for mild pain (or Fever >/= 101).    [name] Kitchen amoxicillin (AMOXIL) 500 MG capsule Take 500 mg by mouth. Take 4 capsule by mouth prior to dental procedure    . aspirin EC 81 MG tablet Take 81 mg by mouth daily.    [name] Kitchen aspirin-acetaminophen-caffeine (EXCEDRIN MIGRAINE) 250-250-65 MG tablet Take by mouth every 6 (six) hours as needed for headache.    . Calcium 600-200 MG-UNIT tablet Take 1 tablet by mouth daily.    . Multiple Vitamins-Minerals (MULTIVITAMIN WITH MINERALS) tablet Take 1 tablet by mouth daily.    . naproxen sodium (ALEVE) 220 MG tablet Take 220 mg by mouth daily as needed.    . triamterene-hydrochlorothiazide (MAXZIDE-25) 37.5-25 MG per tablet Take 1 tablet by mouth Daily.     No current facility-administered medications on file prior to visit.  PAST MEDICAL HISTORY: Past Medical History:  Diagnosis Date  . Arthritis   . Back pain   . 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 Hypertension   . Joint pain   . Leg edema   . Obesity    S/P lap band 2010  . Vitamin D deficiency     PAST SURGICAL HISTORY: Past Surgical History:  Procedure Laterality Date  . ABDOMINAL HYSTERECTOMY     s/p endometrial adenocarcinoma  . LAPAROSCOPIC GASTRIC BANDING  04/17/2009  . TOTAL KNEE ARTHROPLASTY Left 08/25/2016   Procedure: TOTAL KNEE ARTHROPLASTY;  Surgeon: Elsie Saas,  MD;  Location: [facility];  Service: Orthopedics;  Laterality: Left;    SOCIAL HISTORY: Social History   Tobacco Use  . Smoking status: Never Smoker  . Smokeless tobacco: Never Used  Substance Use Topics  . Alcohol use: No  . Drug use: No    FAMILY HISTORY: Family History  Problem Relation Age of Onset  . Hypertension Mother   . Kidney disease Mother   . Hypertension Sister   . Hypertension Maternal Grandmother     ROS: Review of Systems  Constitutional: Positive for weight loss.  Gastrointestinal: Negative for nausea and vomiting.  Genitourinary: Negative for frequency.  Musculoskeletal:       Negative for muscle weakness  Endo/Heme/Allergies: Negative for polydipsia.       Negative for hypoglycemia  Psychiatric/Behavioral: Positive for depression. Negative for suicidal ideas. The patient does not have insomnia.     PHYSICAL EXAM: Blood pressure 125/83, pulse 61, temperature 98.2 F (36.8 C), temperature source Oral, height 5\' 1"  (1.549 m), weight 246 lb (111.6 kg), SpO2 99 %. Body mass index is 46.48 kg/m. Physical Exam  Constitutional: She is oriented to person, place, and time. She appears well-developed and well-nourished.  Cardiovascular: Normal rate.  Pulmonary/Chest: Effort normal.  Musculoskeletal: Normal range of motion.  Neurological: She is oriented to person, place, and time.  Skin: Skin is warm and dry.  Psychiatric: She has a normal mood and affect. Her behavior is normal.  Vitals reviewed.   RECENT LABS AND TESTS: BMET    Component Value Date/Time   NA 142 05/18/2018 1014   K 4.7 05/18/2018 1014   CL 103 05/18/2018 1014   CO2 21 05/18/2018 1014   GLUCOSE 85 05/18/2018 1014   GLUCOSE 125 (H) 08/27/2016 0505   BUN 15 05/18/2018 1014   CREATININE 1.11 (H) 05/18/2018 1014   CALCIUM 10.2 05/18/2018 1014   GFRNONAA 57 (L) 05/18/2018 1014   GFRAA 66 05/18/2018 1014   Lab Results  Component Value Date   HGBA1C 5.6 05/18/2018   HGBA1C 5.7 (H)  12/28/2017   HGBA1C 5.7 (H) 08/15/2016   Lab Results  Component Value Date   INSULIN 14.4 05/18/2018   INSULIN 14.0 12/28/2017   CBC    Component Value Date/Time   WBC 5.6 12/28/2017 1006   WBC 14.6 (H) 08/27/2016 0505   RBC 4.91 12/28/2017 1006   RBC 4.04 08/27/2016 0505   HGB 12.7 12/28/2017 1006   HCT 39.2 12/28/2017 1006   PLT 247 08/27/2016 0505   MCV 80 12/28/2017 1006   MCH 25.9 (L) 12/28/2017 1006   MCH 26.2 08/27/2016 0505   MCHC 32.4 12/28/2017 1006   MCHC 32.2 08/27/2016 0505   RDW 15.9 (H) 12/28/2017 1006   LYMPHSABS 0.9 12/28/2017 1006   EOSABS 0.2 12/28/2017 1006   BASOSABS 0.0 12/28/2017 1006   Iron/TIBC/Ferritin/ %Sat No results found for: IRON, TIBC, FERRITIN, IRONPCTSAT  Lipid Panel     Component Value Date/Time   CHOL 201 (H) 05/18/2018 1014   TRIG 116 05/18/2018 1014   HDL 55 05/18/2018 1014   LDLCALC 123 (H) 05/18/2018 1014   Hepatic Function Panel     Component Value Date/Time   PROT 7.0 05/18/2018 1014   ALBUMIN 4.3 05/18/2018 1014   AST 18 05/18/2018 1014   ALT 14 05/18/2018 1014   ALKPHOS 77 05/18/2018 1014   BILITOT 0.5 05/18/2018 1014      Component Value Date/Time   TSH 3.130 12/28/2017 1006   Results for ANEL, CREIGHTON (MRN [id number]) as of 07/20/2018 09:18  Ref. Range 05/18/2018 10:14  Vitamin D, 25-Hydroxy Latest Ref Range: 30.0 - 100.0 ng/mL 36.5   ASSESSMENT AND PLAN: Prediabetes - Plan: metFORMIN (GLUCOPHAGE) 500 MG tablet  Vitamin D deficiency - Plan: Vitamin D, Ergocalciferol, (DRISDOL) 50000 units CAPS capsule  Other depression - with emotional eating - Plan: buPROPion (WELLBUTRIN SR) 150 MG 12 hr tablet  At risk for diabetes mellitus  Class 3 severe obesity with serious comorbidity and body mass index (BMI) of 45.0 to 49.9 in adult, unspecified obesity type ([name])  PLAN:  Pre-Diabetes [name] will continue to work on weight loss, exercise, and decreasing simple carbohydrates in her diet to help decrease the risk  of diabetes. We dicussed metformin including benefits and risks. She was informed that eating too many simple carbohydrates or too many calories at one sitting increases the likelihood of GI side effects. [name] agrees to continue metformin 500 mg qd #30 with no refills and follow up with [name] as directed to monitor her progress.  Diabetes risk counseling Athelene was given extended (15 minutes) diabetes prevention counseling today. She is 52 y.o. female and has risk factors for diabetes including obesity and prediabetes. We discussed intensive lifestyle modifications today with an emphasis on weight loss as well as increasing exercise and decreasing simple carbohydrates in her diet.  Vitamin D Deficiency [name] was informed that low vitamin D levels contributes to fatigue and are associated with obesity, breast, and colon cancer. She agrees to continue to take prescription Vit D @50 ,000 IU every week #4 with no refills and will follow up for routine testing of vitamin D, at least 2-3 times per year. She was informed of the risk of over-replacement of vitamin D and agrees to not increase her dose unless she discusses this with [name] first. [name] agrees to follow up as directed.  Depression with Emotional Eating Behaviors We discussed behavior modification techniques today to help [name] deal with her emotional eating and depression. She has agreed to take Wellbutrin SR 150 mg qd #30 with no refills and follow up as directed.  Obesity [name] is currently in the action stage of change. As such, her goal is to continue with weight loss efforts She has agreed to follow the Category 2 plan [name] has been instructed to work up to a goal of 150 minutes of combined cardio and strengthening exercise per week for weight loss and overall health benefits. We discussed the following Behavioral Modification Strategies today: increasing lean protein intake, work on meal planning and easy cooking plans and emotional  eating strategies  [name] has agreed to follow up with our clinic in 3 weeks. She was informed of the importance of frequent follow up visits to maximize her success with intensive lifestyle modifications for her multiple health conditions.   OBESITY BEHAVIORAL INTERVENTION VISIT  Today's visit was # 10   Starting  weight: 261 lbs Starting date: 12/28/17 Today's weight : 246 lbs Today's date: 07/15/2018 Total lbs lost to date: 15 At least 15 minutes were spent on discussing the following behavioral intervention visit.   ASK: We discussed the diagnosis of obesity with [name] today and [name] agreed to give [name] permission to discuss obesity behavioral modification therapy today.  ASSESS: [name] has the diagnosis of obesity and her BMI today is 46.51 [name] is in the action stage of change   ADVISE: [name] was educated on the multiple health risks of obesity as well as the benefit of weight loss to improve her health. She was advised of the need for long term treatment and the importance of lifestyle modifications to improve her current health and to decrease her risk of future health problems.  AGREE: Multiple dietary modification options and treatment options were discussed and  [name] agreed to follow the recommendations documented in the above note.  ARRANGE: [name] was educated on the importance of frequent visits to treat obesity as outlined per CMS and USPSTF guidelines and agreed to schedule her next follow up appointment today.  I, Doreene Nest, am acting as transcriptionist for Dennard Nip, MD  I have reviewed the above documentation for accuracy and completeness, and I agree with the above. -Dennard Nip, MD

## 2018-08-10 ENCOUNTER — Ambulatory Visit (INDEPENDENT_AMBULATORY_CARE_PROVIDER_SITE_OTHER): Payer: BLUE CROSS/BLUE SHIELD | Admitting: Family Medicine

## 2018-08-10 VITALS — BP 114/78 | HR 76 | Temp 98.2°F | Ht 61.0 in | Wt 252.0 lb

## 2018-08-10 DIAGNOSIS — Z6841 Body Mass Index (BMI) 40.0 and over, adult: Secondary | ICD-10-CM

## 2018-08-10 DIAGNOSIS — E559 Vitamin D deficiency, unspecified: Secondary | ICD-10-CM | POA: Diagnosis not present

## 2018-08-10 DIAGNOSIS — R7303 Prediabetes: Secondary | ICD-10-CM | POA: Diagnosis not present

## 2018-08-10 DIAGNOSIS — E7849 Other hyperlipidemia: Secondary | ICD-10-CM

## 2018-08-11 LAB — COMPREHENSIVE METABOLIC PANEL
ALBUMIN: 4 g/dL (ref 3.5–5.5)
ALT: 17 IU/L (ref 0–32)
AST: 20 IU/L (ref 0–40)
Albumin/Globulin Ratio: 1.7 (ref 1.2–2.2)
Alkaline Phosphatase: 86 IU/L (ref 39–117)
BUN/Creatinine Ratio: 17 (ref 9–23)
BUN: 16 mg/dL (ref 6–24)
Bilirubin Total: 0.4 mg/dL (ref 0.0–1.2)
CO2: 26 mmol/L (ref 20–29)
Calcium: 10 mg/dL (ref 8.7–10.2)
Chloride: 101 mmol/L (ref 96–106)
Creatinine, Ser: 0.93 mg/dL (ref 0.57–1.00)
GFR calc Af Amer: 81 mL/min/{1.73_m2} (ref >59)
GFR, EST NON AFRICAN AMERICAN: 70 mL/min/{1.73_m2} (ref >59)
GLOBULIN, TOTAL: 2.4 g/dL (ref 1.5–4.5)
Glucose: 76 mg/dL (ref 65–99)
Potassium: 4.5 mmol/L (ref 3.5–5.2)
Sodium: 142 mmol/L (ref 134–144)
TOTAL PROTEIN: 6.4 g/dL (ref 6.0–8.5)

## 2018-08-11 LAB — HEMOGLOBIN A1C
Est. average glucose Bld gHb Est-mCnc: 114 mg/dL
Hgb A1c MFr Bld: 5.6 % (ref 4.8–5.6)

## 2018-08-11 LAB — LIPID PANEL WITH LDL/HDL RATIO
CHOLESTEROL TOTAL: 216 mg/dL — AB (ref 100–199)
HDL: 61 mg/dL (ref >39)
LDL Calculated: 131 mg/dL — ABNORMAL HIGH (ref 0–99)
LDl/HDL Ratio: 2.1 ratio (ref 0.0–3.2)
Triglycerides: 120 mg/dL (ref 0–149)
VLDL Cholesterol Cal: 24 mg/dL (ref 5–40)

## 2018-08-11 LAB — VITAMIN D 25 HYDROXY (VIT D DEFICIENCY, FRACTURES): Vit D, 25-Hydroxy: 22.5 ng/mL — ABNORMAL LOW (ref 30.0–100.0)

## 2018-08-11 LAB — INSULIN, RANDOM: INSULIN: 7.7 u[IU]/mL (ref 2.6–24.9)

## 2018-08-11 MED ORDER — VITAMIN D (ERGOCALCIFEROL) 1.25 MG (50000 UNIT) PO CAPS: 50000.0000 [IU] | ORAL_CAPSULE | ORAL | 0 refills | Status: DC

## 2018-08-11 NOTE — Progress Notes (Signed)
Office: [phone]  /  Fax: [phone]   HPI:   Chief Complaint: OBESITY [name] is here to discuss her progress with her obesity treatment plan. She is on the Category 2 plan and is following her eating plan approximately 25 % of the time. She states she is doing water zumba and strength training for 60 minutes 2 times per week. [name] struggles to follow her Category 2 plan due to increased stress eating. She states she is ready to get back on track but is worried about holiday eating.  Her weight is 252 lb (114.3 kg) today and has gained 6 pounds since her last visit. She has lost 9 lbs since starting treatment with [name].  Vitamin D Deficiency [name] has a diagnosis of vitamin D deficiency. She is stable on prescription Vit D and denies nausea, vomiting or muscle weakness.  Pre-Diabetes [name] has a diagnosis of pre-diabetes based on her elevated Hgb A1c and was informed this puts her at greater risk of developing diabetes. She is stable on metformin and continues to work on diet and exercise to decrease risk of diabetes. She denies nausea, vomiting, or hypoglycemia.  Hyperlipidemia [name] has hyperlipidemia and she is attempting to improve her cholesterol levels with intensive lifestyle modification including a low saturated fat diet, exercise and weight loss. She is due for labs and denies any chest pain, claudication or myalgias.  ALLERGIES: Allergies  Allergen Reactions  . No Known Allergies     MEDICATIONS: Current Outpatient Medications on File Prior to Visit  Medication Sig Dispense Refill  . acetaminophen (TYLENOL) 325 MG tablet Take 2 tablets (650 mg total) by mouth every 6 (six) hours as needed for mild pain (or Fever >/= 101).    [name] Kitchen amoxicillin (AMOXIL) 500 MG capsule Take 500 mg by mouth. Take 4 capsule by mouth prior to dental procedure    . aspirin EC 81 MG tablet Take 81 mg by mouth daily.    [name] Kitchen aspirin-acetaminophen-caffeine (EXCEDRIN MIGRAINE) 250-250-65 MG  tablet Take by mouth every 6 (six) hours as needed for headache.    [name] Kitchen buPROPion (WELLBUTRIN SR) 150 MG 12 hr tablet Take 1 tablet (150 mg total) by mouth daily. 30 tablet 0  . Calcium 600-200 MG-UNIT tablet Take 1 tablet by mouth daily.    . cyclobenzaprine (FLEXERIL) 10 MG tablet Take 10 mg by mouth 3 (three) times daily as needed for muscle spasms.    . meloxicam (MOBIC) 15 MG tablet Take 15 mg by mouth daily.    . metFORMIN (GLUCOPHAGE) 500 MG tablet Take 1 tablet (500 mg total) by mouth daily with breakfast. 30 tablet 0  . Multiple Vitamins-Minerals (MULTIVITAMIN WITH MINERALS) tablet Take 1 tablet by mouth daily.    . naproxen sodium (ALEVE) 220 MG tablet Take 220 mg by mouth daily as needed.    . triamterene-hydrochlorothiazide (MAXZIDE-25) 37.5-25 MG per tablet Take 1 tablet by mouth Daily.     No current facility-administered medications on file prior to visit.     PAST MEDICAL HISTORY: Past Medical History:  Diagnosis Date  . Arthritis   . Back pain   . 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 Hypertension   . Joint pain   . Leg edema   . Obesity    S/P lap band 2010  . Vitamin D deficiency     PAST SURGICAL HISTORY: Past Surgical History:  Procedure Laterality Date  . ABDOMINAL HYSTERECTOMY     s/p endometrial adenocarcinoma  . LAPAROSCOPIC GASTRIC BANDING  04/17/2009  . TOTAL KNEE ARTHROPLASTY Left 08/25/2016   Procedure: TOTAL KNEE ARTHROPLASTY;  Surgeon: Elsie Saas, MD;  Location: [facility];  Service: Orthopedics;  Laterality: Left;    SOCIAL HISTORY: Social History   Tobacco Use  . Smoking status: Never Smoker  . Smokeless tobacco: Never Used  Substance Use Topics  . Alcohol use: No  . Drug use: No    FAMILY HISTORY: Family History  Problem Relation Age of Onset  . Hypertension Mother   . Kidney disease Mother   . Hypertension Sister   . Hypertension Maternal Grandmother     ROS: Review of Systems  Constitutional: Negative for weight loss.    Cardiovascular: Negative for chest pain and claudication.  Gastrointestinal: Negative for nausea and vomiting.  Musculoskeletal: Negative for myalgias.       Negative muscle weakness  Endo/Heme/Allergies:       Negative hypoglycemia    PHYSICAL EXAM: Blood pressure 114/78, pulse 76, temperature 98.2 F (36.8 C), temperature source Oral, height 5\' 1"  (1.549 m), weight 252 lb (114.3 kg), SpO2 100 %. Body mass index is 47.61 kg/m. Physical Exam  Constitutional: She is oriented to person, place, and time. She appears well-developed and well-nourished.  Cardiovascular: Normal rate.  Pulmonary/Chest: Effort normal.  Musculoskeletal: Normal range of motion.  Neurological: She is oriented to person, place, and time.  Skin: Skin is warm and dry.  Psychiatric: She has a normal mood and affect. Her behavior is normal.  Vitals reviewed.   RECENT LABS AND TESTS: BMET    Component Value Date/Time   NA 142 08/10/2018 1249   K 4.5 08/10/2018 1249   CL 101 08/10/2018 1249   CO2 26 08/10/2018 1249   GLUCOSE 76 08/10/2018 1249   GLUCOSE 125 (H) 08/27/2016 0505   BUN 16 08/10/2018 1249   CREATININE 0.93 08/10/2018 1249   CALCIUM 10.0 08/10/2018 1249   GFRNONAA 70 08/10/2018 1249   GFRAA 81 08/10/2018 1249   Lab Results  Component Value Date   HGBA1C 5.6 08/10/2018   HGBA1C 5.6 05/18/2018   HGBA1C 5.7 (H) 12/28/2017   HGBA1C 5.7 (H) 08/15/2016   Lab Results  Component Value Date   INSULIN 7.7 08/10/2018   INSULIN 14.4 05/18/2018   INSULIN 14.0 12/28/2017   CBC    Component Value Date/Time   WBC 5.6 12/28/2017 1006   WBC 14.6 (H) 08/27/2016 0505   RBC 4.91 12/28/2017 1006   RBC 4.04 08/27/2016 0505   HGB 12.7 12/28/2017 1006   HCT 39.2 12/28/2017 1006   PLT 247 08/27/2016 0505   MCV 80 12/28/2017 1006   MCH 25.9 (L) 12/28/2017 1006   MCH 26.2 08/27/2016 0505   MCHC 32.4 12/28/2017 1006   MCHC 32.2 08/27/2016 0505   RDW 15.9 (H) 12/28/2017 1006   LYMPHSABS 0.9  12/28/2017 1006   EOSABS 0.2 12/28/2017 1006   BASOSABS 0.0 12/28/2017 1006   Iron/TIBC/Ferritin/ %Sat No results found for: IRON, TIBC, FERRITIN, IRONPCTSAT Lipid Panel     Component Value Date/Time   CHOL 216 (H) 08/10/2018 1249   TRIG 120 08/10/2018 1249   HDL 61 08/10/2018 1249   LDLCALC 131 (H) 08/10/2018 1249   Hepatic Function Panel     Component Value Date/Time   PROT 6.4 08/10/2018 1249   ALBUMIN 4.0 08/10/2018 1249   AST 20 08/10/2018 1249   ALT 17 08/10/2018 1249   ALKPHOS 86 08/10/2018 1249   BILITOT 0.4 08/10/2018 1249      Component Value  Date/Time   TSH 3.130 12/28/2017 1006   Results for LAURIELLE, SELMON (MRN [id number]) as of 08/11/2018 14:16  Ref. Range 05/18/2018 10:14  Vitamin D, 25-Hydroxy Latest Ref Range: 30.0 - 100.0 ng/mL 36.5   ASSESSMENT AND PLAN: Vitamin D deficiency - Plan: VITAMIN D 25 Hydroxy (Vit-D Deficiency, Fractures), Vitamin D, Ergocalciferol, (DRISDOL) 1.25 MG (50000 UT) CAPS capsule  Prediabetes - Plan: Comprehensive metabolic panel, Hemoglobin A1c, Insulin, random  Other hyperlipidemia - Plan: Lipid Panel With LDL/HDL Ratio  Class 3 severe obesity with serious comorbidity and body mass index (BMI) of 45.0 to 49.9 in adult, unspecified obesity type ([facility])  PLAN:  Vitamin D Deficiency Alijah was informed that low vitamin D levels contributes to fatigue and are associated with obesity, breast, and colon cancer. Vernette agrees to continue taking prescription Vit D @50 ,000 IU every week #4 and we will refill for 1 month. She will follow up for routine testing of vitamin D, at least 2-3 times per year. She was informed of the risk of over-replacement of vitamin D and agrees to not increase her dose unless she discusses this with [name] first. We will check labs and Gaylene agrees to follow up with our clinic in 3 weeks.  Pre-Diabetes Mirenda will continue to work on weight loss, exercise, and decreasing simple carbohydrates in her diet to help  decrease the risk of diabetes. We dicussed metformin including benefits and risks. She was informed that eating too many simple carbohydrates or too many calories at one sitting increases the likelihood of GI side effects. Josanne agrees to continue taking metformin and we will check labs today. Tanette agrees to follow up with our clinic in 3 weeks as directed to monitor her progress.  Hyperlipidemia Avrianna was informed of the American Heart Association Guidelines emphasizing intensive lifestyle modifications as the first line treatment for hyperlipidemia. We discussed many lifestyle modifications today in depth, and Felicite will continue to work on decreasing saturated fats such as fatty red meat, butter and many fried foods. She will also increase vegetables and lean protein in her diet and continue to work on diet, exercise, and weight loss efforts. We will check labs and Miyo agrees to follow up with our clinic in 3 weeks.  Obesity Raneisha is currently in the action stage of change. As such, her goal is to continue with weight loss efforts She has agreed to follow the Category 2 plan Syanna has been instructed to work up to a goal of 150 minutes of combined cardio and strengthening exercise per week for weight loss and overall health benefits. We discussed the following Behavioral Modification Strategies today: increasing lean protein intake, decreasing simple carbohydrates  and holiday eating strategies    Aminah has agreed to follow up with our clinic in 3 weeks. She was informed of the importance of frequent follow up visits to maximize her success with intensive lifestyle modifications for her multiple health conditions.   OBESITY BEHAVIORAL INTERVENTION VISIT  Today's visit was # 11   Starting weight: 261 lbs Starting date: 12/28/17 Today's weight : 252 lbs Today's date: 08/10/2018 Total lbs lost to date: 9    ASK: We discussed the diagnosis of obesity with [name] today  and Starlett agreed to give [name] permission to discuss obesity behavioral modification therapy today.  ASSESS: Genesys has the diagnosis of obesity and her BMI today is 47.64 Marykathleen is in the action stage of change   ADVISE: Reene was educated on the multiple health risks  of obesity as well as the benefit of weight loss to improve her health. She was advised of the need for long term treatment and the importance of lifestyle modifications to improve her current health and to decrease her risk of future health problems.  AGREE: Multiple dietary modification options and treatment options were discussed and  Malita agreed to follow the recommendations documented in the above note.  ARRANGE: Airyanna was educated on the importance of frequent visits to treat obesity as outlined per CMS and USPSTF guidelines and agreed to schedule her next follow up appointment today.  I, Trixie Dredge, am acting as transcriptionist for Dennard Nip, MD  I have reviewed the above documentation for accuracy and completeness, and I agree with the above. -Dennard Nip, MD

## 2018-08-31 ENCOUNTER — Other Ambulatory Visit (INDEPENDENT_AMBULATORY_CARE_PROVIDER_SITE_OTHER): Payer: Self-pay | Admitting: Family Medicine

## 2018-08-31 DIAGNOSIS — E559 Vitamin D deficiency, unspecified: Secondary | ICD-10-CM

## 2018-09-02 ENCOUNTER — Ambulatory Visit (INDEPENDENT_AMBULATORY_CARE_PROVIDER_SITE_OTHER): Payer: BLUE CROSS/BLUE SHIELD | Admitting: Family Medicine

## 2018-09-02 ENCOUNTER — Other Ambulatory Visit (INDEPENDENT_AMBULATORY_CARE_PROVIDER_SITE_OTHER): Payer: Self-pay | Admitting: Family Medicine

## 2018-09-02 DIAGNOSIS — R7303 Prediabetes: Secondary | ICD-10-CM

## 2018-09-03 ENCOUNTER — Other Ambulatory Visit (INDEPENDENT_AMBULATORY_CARE_PROVIDER_SITE_OTHER): Payer: Self-pay | Admitting: Family Medicine

## 2018-09-03 DIAGNOSIS — R7303 Prediabetes: Secondary | ICD-10-CM

## 2018-09-07 ENCOUNTER — Ambulatory Visit (INDEPENDENT_AMBULATORY_CARE_PROVIDER_SITE_OTHER): Payer: BLUE CROSS/BLUE SHIELD | Admitting: Family Medicine

## 2018-09-07 VITALS — BP 119/82 | HR 78 | Temp 98.1°F | Ht 61.0 in | Wt 254.0 lb

## 2018-09-07 DIAGNOSIS — F3289 Other specified depressive episodes: Secondary | ICD-10-CM

## 2018-09-07 DIAGNOSIS — Z9189 Other specified personal risk factors, not elsewhere classified: Secondary | ICD-10-CM

## 2018-09-07 DIAGNOSIS — E559 Vitamin D deficiency, unspecified: Secondary | ICD-10-CM | POA: Diagnosis not present

## 2018-09-07 DIAGNOSIS — Z6841 Body Mass Index (BMI) 40.0 and over, adult: Secondary | ICD-10-CM

## 2018-09-07 DIAGNOSIS — R7303 Prediabetes: Secondary | ICD-10-CM

## 2018-09-07 MED ORDER — METFORMIN HCL 500 MG PO TABS: 500.0000 mg | ORAL_TABLET | Freq: Every day | ORAL | 0 refills | Status: DC

## 2018-09-07 MED ORDER — BUPROPION HCL ER (SR) 150 MG PO TB12: 150.0000 mg | ORAL_TABLET | Freq: Every day | ORAL | 0 refills | Status: DC

## 2018-09-07 MED ORDER — VITAMIN D (ERGOCALCIFEROL) 1.25 MG (50000 UNIT) PO CAPS: 50000.0000 [IU] | ORAL_CAPSULE | ORAL | 0 refills | Status: DC

## 2018-09-08 NOTE — Progress Notes (Signed)
Office: [phone]  /  Fax: [phone]   HPI:   Chief Complaint: OBESITY [name] is here to discuss her progress with her obesity treatment plan. She is on the Category 2 plan and is following her eating plan approximately 50 % of the time. She states she is walking 20 minutes 3 times per week. [name] had done well with trying to portion control over Thanksgiving, but recognizes that she hasn't done as well with meal prep and planning and has increased eating out and "grazing".  Her weight is 254 lb (115.2 kg) today and has had a weight gain of 2 pounds over a period of 3 weeks since her last visit. She has lost 7 lbs since starting treatment with [name].  Pre-Diabetes [name] has a diagnosis of pre-diabetes based on her elevated Hgb A1c and was informed this puts her at greater risk of developing diabetes. She is improving slowly on her diet and taking metformin. Her A1c and Insulin are lower. She continues to work on diet and exercise to decrease risk of diabetes. She denies hypoglycemia.  At risk for diabetes [name] is at higher than average risk for developing diabetes due to her pre-diabetes and obesity. She currently denies polyuria or polydipsia.  Depression with emotional eating behaviors [name] is struggling with emotional eating and using food for comfort to the extent that it is negatively impacting her health. She often snacks when she is not hungry. [name] sometimes feels she is out of control and then feels guilty that she made poor food choices. She has been working on behavior modification techniques to help reduce her emotional eating and has been somewhat successful. Her mood is stable on Wellbutrin and her blood pressure is good. She denies insomnia.  Vitamin D deficiency [name] has a diagnosis of vitamin D deficiency. She is currently taking vit D and is stable, but not yet at goal. She admits fatigue is improving and denies nausea, vomiting, or muscle  weakness.  ALLERGIES: Allergies  Allergen Reactions  . No Known Allergies     MEDICATIONS: Current Outpatient Medications on File Prior to Visit  Medication Sig Dispense Refill  . acetaminophen (TYLENOL) 325 MG tablet Take 2 tablets (650 mg total) by mouth every 6 (six) hours as needed for mild pain (or Fever >/= 101).    [name] Kitchen amoxicillin (AMOXIL) 500 MG capsule Take 500 mg by mouth. Take 4 capsule by mouth prior to dental procedure    . aspirin EC 81 MG tablet Take 81 mg by mouth daily.    [name] Kitchen aspirin-acetaminophen-caffeine (EXCEDRIN MIGRAINE) 250-250-65 MG tablet Take by mouth every 6 (six) hours as needed for headache.    . Calcium 600-200 MG-UNIT tablet Take 1 tablet by mouth daily.    . cyclobenzaprine (FLEXERIL) 10 MG tablet Take 10 mg by mouth 3 (three) times daily as needed for muscle spasms.    . meloxicam (MOBIC) 15 MG tablet Take 15 mg by mouth daily.    . Multiple Vitamins-Minerals (MULTIVITAMIN WITH MINERALS) tablet Take 1 tablet by mouth daily.    . naproxen sodium (ALEVE) 220 MG tablet Take 220 mg by mouth daily as needed.    . triamterene-hydrochlorothiazide (MAXZIDE-25) 37.5-25 MG per tablet Take 1 tablet by mouth Daily.     No current facility-administered medications on file prior to visit.     PAST MEDICAL HISTORY: Past Medical History:  Diagnosis Date  . Arthritis   . Back pain   . 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 Hypertension   .  Joint pain   . Leg edema   . Obesity    S/P lap band 2010  . Vitamin D deficiency     PAST SURGICAL HISTORY: Past Surgical History:  Procedure Laterality Date  . ABDOMINAL HYSTERECTOMY     s/p endometrial adenocarcinoma  . LAPAROSCOPIC GASTRIC BANDING  04/17/2009  . TOTAL KNEE ARTHROPLASTY Left 08/25/2016   Procedure: TOTAL KNEE ARTHROPLASTY;  Surgeon: Elsie Saas, MD;  Location: [facility];  Service: Orthopedics;  Laterality: Left;    SOCIAL HISTORY: Social History   Tobacco Use  . Smoking status: Never Smoker  .  Smokeless tobacco: Never Used  Substance Use Topics  . Alcohol use: No  . Drug use: No    FAMILY HISTORY: Family History  Problem Relation Age of Onset  . Hypertension Mother   . Kidney disease Mother   . Hypertension Sister   . Hypertension Maternal Grandmother     ROS: Review of Systems  Constitutional: Positive for malaise/fatigue. Negative for weight loss.  Gastrointestinal: Negative for nausea and vomiting.  Genitourinary:       Negative for polyuria.  Musculoskeletal:       Negative for muscle weakness.  Endo/Heme/Allergies: Negative for polydipsia.       Negative for hypoglycemia.  Psychiatric/Behavioral: Positive for depression. The patient does not have insomnia.     PHYSICAL EXAM: Blood pressure 119/82, pulse 78, temperature 98.1 F (36.7 C), temperature source Oral, height 5\' 1"  (1.549 m), weight 254 lb (115.2 kg), SpO2 100 %. Body mass index is 47.99 kg/m. Physical Exam  Constitutional: She is oriented to person, place, and time. She appears well-developed and well-nourished.  Cardiovascular: Normal rate.  Pulmonary/Chest: Effort normal.  Musculoskeletal: Normal range of motion.  Neurological: She is oriented to person, place, and time.  Skin: Skin is warm and dry.  Psychiatric: She has a normal mood and affect. Her behavior is normal.  Vitals reviewed.   RECENT LABS AND TESTS: BMET    Component Value Date/Time   NA 142 08/10/2018 1249   K 4.5 08/10/2018 1249   CL 101 08/10/2018 1249   CO2 26 08/10/2018 1249   GLUCOSE 76 08/10/2018 1249   GLUCOSE 125 (H) 08/27/2016 0505   BUN 16 08/10/2018 1249   CREATININE 0.93 08/10/2018 1249   CALCIUM 10.0 08/10/2018 1249   GFRNONAA 70 08/10/2018 1249   GFRAA 81 08/10/2018 1249   Lab Results  Component Value Date   HGBA1C 5.6 08/10/2018   HGBA1C 5.6 05/18/2018   HGBA1C 5.7 (H) 12/28/2017   HGBA1C 5.7 (H) 08/15/2016   Lab Results  Component Value Date   INSULIN 7.7 08/10/2018   INSULIN 14.4  05/18/2018   INSULIN 14.0 12/28/2017   CBC    Component Value Date/Time   WBC 5.6 12/28/2017 1006   WBC 14.6 (H) 08/27/2016 0505   RBC 4.91 12/28/2017 1006   RBC 4.04 08/27/2016 0505   HGB 12.7 12/28/2017 1006   HCT 39.2 12/28/2017 1006   PLT 247 08/27/2016 0505   MCV 80 12/28/2017 1006   MCH 25.9 (L) 12/28/2017 1006   MCH 26.2 08/27/2016 0505   MCHC 32.4 12/28/2017 1006   MCHC 32.2 08/27/2016 0505   RDW 15.9 (H) 12/28/2017 1006   LYMPHSABS 0.9 12/28/2017 1006   EOSABS 0.2 12/28/2017 1006   BASOSABS 0.0 12/28/2017 1006   Iron/TIBC/Ferritin/ %Sat No results found for: IRON, TIBC, FERRITIN, IRONPCTSAT Lipid Panel     Component Value Date/Time   CHOL 216 (H) 08/10/2018 1249  TRIG 120 08/10/2018 1249   HDL 61 08/10/2018 1249   LDLCALC 131 (H) 08/10/2018 1249   Hepatic Function Panel     Component Value Date/Time   PROT 6.4 08/10/2018 1249   ALBUMIN 4.0 08/10/2018 1249   AST 20 08/10/2018 1249   ALT 17 08/10/2018 1249   ALKPHOS 86 08/10/2018 1249   BILITOT 0.4 08/10/2018 1249      Component Value Date/Time   TSH 3.130 12/28/2017 1006   Results for KOYA, HUNGER (MRN [id number]) as of 09/08/2018 06:49  Ref. Range 08/10/2018 12:49  Vitamin D, 25-Hydroxy Latest Ref Range: 30.0 - 100.0 ng/mL 22.5 (L)   ASSESSMENT AND PLAN: Prediabetes - Plan: metFORMIN (GLUCOPHAGE) 500 MG tablet  Vitamin D deficiency - Plan: Vitamin D, Ergocalciferol, (DRISDOL) 1.25 MG (50000 UT) CAPS capsule  Other depression - with emotional eating - Plan: buPROPion (WELLBUTRIN SR) 150 MG 12 hr tablet  At risk for diabetes mellitus  Class 3 severe obesity with serious comorbidity and body mass index (BMI) of 45.0 to 49.9 in adult, unspecified obesity type ([place])  PLAN:  Pre-Diabetes [name] will continue to work on weight loss, exercise, and decreasing simple carbohydrates in her diet to help decrease the risk of diabetes.  She was informed that eating too many simple carbohydrates or too  many calories at one sitting increases the likelihood of GI side effects. [name] agreed to continue metformin 500mg  with breakfast #30 with no refills and a prescription was written today. Taquisha agreed to follow up with [name] as directed to monitor her progress in 3 to 4 weeks.  Diabetes risk counseling [name] was given extended (15 minutes) diabetes prevention counseling today. She is 52 y.o. female and has risk factors for diabetes including pre-diabetes and obesity. We discussed intensive lifestyle modifications today with an emphasis on weight loss as well as increasing exercise and decreasing simple carbohydrates in her diet.  Vitamin D Deficiency [name] was informed that low vitamin D levels contributes to fatigue and are associated with obesity, breast, and colon cancer. She agrees to continue to take prescription Vit D @50 ,000 IU every week #4 with no refills and will follow up for routine testing of vitamin D, at least 2-3 times per year. She was informed of the risk of over-replacement of vitamin D and agrees to not increase her dose unless she discusses this with [name] first. [name] agrees to follow up as directed.  Depression with Emotional Eating Behaviors We discussed behavior modification techniques today to help [name] deal with her emotional eating and depression. She has agreed to take Wellbutrin SR 150 mg qd #30 with no refills and agreed to follow up as directed.  Obesity [name] is currently in the action stage of change. As such, her goal is to continue with weight loss efforts. She has agreed to follow the Category 2 plan. [name] has been instructed to work up to a goal of 150 minutes of combined cardio and strengthening exercise per week for weight loss and overall health benefits. We discussed the following Behavioral Modification Strategies today: increasing lean protein intake, decreasing simple carbohydrates, work on meal planning and easy cooking plans, holiday eating  strategies, celebration eating strategies, and emotional eating strategies.  [name] has agreed to follow up with our clinic in 3 to 4 weeks. She was informed of the importance of frequent follow up visits to maximize her success with intensive lifestyle modifications for her multiple health conditions.   OBESITY BEHAVIORAL INTERVENTION VISIT  Today's visit was #  12   Starting weight: 261 lbs Starting date: 12/28/17 Today's weight : Weight: 254 lb (115.2 kg)  Today's date: 09/07/2018 Total lbs lost to date: 7  ASK: We discussed the diagnosis of obesity with [name] today and [name] agreed to give [name] permission to discuss obesity behavioral modification therapy today.  ASSESS: [name] has the diagnosis of obesity and her BMI today is 47.6. [name] is in the action stage of change.   ADVISE: [name] was educated on the multiple health risks of obesity as well as the benefit of weight loss to improve her health. She was advised of the need for long term treatment and the importance of lifestyle modifications to improve her current health and to decrease her risk of future health problems.  AGREE: Multiple dietary modification options and treatment options were discussed and [name] agreed to follow the recommendations documented in the above note.  ARRANGE: [name] was educated on the importance of frequent visits to treat obesity as outlined per CMS and USPSTF guidelines and agreed to schedule her next follow up appointment today.  I, Marcille Blanco, am acting as transcriptionist for Starlyn Skeans, MD  I have reviewed the above documentation for accuracy and completeness, and I agree with the above. -Dennard Nip, MD

## 2018-09-23 DIAGNOSIS — Z01419 Encounter for gynecological examination (general) (routine) without abnormal findings: Secondary | ICD-10-CM | POA: Diagnosis not present

## 2018-09-23 DIAGNOSIS — Z1211 Encounter for screening for malignant neoplasm of colon: Secondary | ICD-10-CM | POA: Diagnosis not present

## 2018-09-23 DIAGNOSIS — Z6841 Body Mass Index (BMI) 40.0 and over, adult: Secondary | ICD-10-CM | POA: Diagnosis not present

## 2018-09-23 DIAGNOSIS — Z1231 Encounter for screening mammogram for malignant neoplasm of breast: Secondary | ICD-10-CM | POA: Diagnosis not present

## 2018-09-26 ENCOUNTER — Other Ambulatory Visit (INDEPENDENT_AMBULATORY_CARE_PROVIDER_SITE_OTHER): Payer: Self-pay | Admitting: Family Medicine

## 2018-09-26 DIAGNOSIS — E559 Vitamin D deficiency, unspecified: Secondary | ICD-10-CM

## 2018-09-30 ENCOUNTER — Other Ambulatory Visit (INDEPENDENT_AMBULATORY_CARE_PROVIDER_SITE_OTHER): Payer: Self-pay | Admitting: Family Medicine

## 2018-09-30 DIAGNOSIS — F3289 Other specified depressive episodes: Secondary | ICD-10-CM

## 2018-10-05 ENCOUNTER — Other Ambulatory Visit (INDEPENDENT_AMBULATORY_CARE_PROVIDER_SITE_OTHER): Payer: Self-pay | Admitting: Family Medicine

## 2018-10-05 ENCOUNTER — Ambulatory Visit (INDEPENDENT_AMBULATORY_CARE_PROVIDER_SITE_OTHER): Payer: BLUE CROSS/BLUE SHIELD | Admitting: Family Medicine

## 2018-10-05 ENCOUNTER — Encounter (INDEPENDENT_AMBULATORY_CARE_PROVIDER_SITE_OTHER): Payer: Self-pay | Admitting: Family Medicine

## 2018-10-05 VITALS — BP 116/78 | HR 66 | Temp 97.5°F | Ht 61.0 in | Wt 255.0 lb

## 2018-10-05 DIAGNOSIS — F3289 Other specified depressive episodes: Secondary | ICD-10-CM

## 2018-10-05 DIAGNOSIS — Z6841 Body Mass Index (BMI) 40.0 and over, adult: Secondary | ICD-10-CM | POA: Diagnosis not present

## 2018-10-05 DIAGNOSIS — E559 Vitamin D deficiency, unspecified: Secondary | ICD-10-CM

## 2018-10-05 DIAGNOSIS — E66813 Obesity, class 3: Secondary | ICD-10-CM

## 2018-10-05 MED ORDER — VITAMIN D (ERGOCALCIFEROL) 1.25 MG (50000 UNIT) PO CAPS: 50000.0000 [IU] | ORAL_CAPSULE | ORAL | 0 refills | Status: DC

## 2018-10-06 NOTE — Progress Notes (Signed)
Office: [phone]  /  Fax: [phone]   HPI:   Chief Complaint: OBESITY [name] is here to discuss her progress with her obesity treatment plan. She is on the Category 2 plan and is following her eating plan approximately 50 % of the time. She states she is walking 40 minutes 3 times per week. [name] did well mitigating her weight gain over the holidays. She is ready to get back on track, but would like to have some freedom at times.  Her weight is 255 lb (115.7 kg) today and has had [name] weight gain of 1 pound over [name] period of 4 weeks since her last visit. She has lost 6 lbs since starting treatment with [name].  Vitamin D deficiency [name] has [name] diagnosis of vitamin D deficiency. She is currently taking vit D, but is not yet at goal. She denies nausea, vomiting, or muscle weakness.  ASSESSMENT AND PLAN:  Vitamin D deficiency - Plan: Vitamin D, Ergocalciferol, (DRISDOL) 1.25 MG (50000 UT) CAPS capsule  Class 3 severe obesity with serious comorbidity and body mass index (BMI) of 45.0 to 49.9 in adult, unspecified obesity type ([place])  PLAN:  Vitamin D Deficiency [name] was informed that low vitamin D levels contributes to fatigue and are associated with obesity, breast, and colon cancer. She agrees to continue to take prescription Vit D @50 ,000 IU every week #4 with no refills and will follow up for routine testing of vitamin D, at least 2-3 times per year. She was informed of the risk of over-replacement of vitamin D and agrees to not increase her dose unless she discusses this with [name] first. [name] agrees to follow up in 2 weeks.  I spent > than 50% of the 25 minute visit on counseling as documented in the note.  Obesity [name] is currently in the action stage of change. As such, her goal is to continue with weight loss efforts. She has agreed to follow the Category 2 plan and to keep [name] food journal with 300 to 450 calories and 30+ grams of protein at lunch. [name] has been instructed  to work up to [name] goal of 150 minutes of combined cardio and strengthening exercise per week for weight loss and overall health benefits. We discussed the following Behavioral Modification Strategies today: increasing lean protein intake, decreasing simple carbohydrates, work on meal planning and easy cooking plans, and emotional eating strategies.  [name] has agreed to follow up with our clinic in 2 weeks. She was informed of the importance of frequent follow up visits to maximize her success with intensive lifestyle modifications for her multiple health conditions.  ALLERGIES: Allergies  Allergen Reactions  . No Known Allergies     MEDICATIONS: Current Outpatient Medications on File Prior to Visit  Medication Sig Dispense Refill  . acetaminophen (TYLENOL) 325 MG tablet Take 2 tablets (650 mg total) by mouth every 6 (six) hours as needed for mild pain (or Fever >/= 101).    [name] Kitchen amoxicillin (AMOXIL) 500 MG capsule Take 500 mg by mouth. Take 4 capsule by mouth prior to dental procedure    . aspirin EC 81 MG tablet Take 81 mg by mouth daily.    [name] Kitchen aspirin-acetaminophen-caffeine (EXCEDRIN MIGRAINE) 250-250-65 MG tablet Take by mouth every 6 (six) hours as needed for headache.    [name] Kitchen buPROPion (WELLBUTRIN SR) 150 MG 12 hr tablet Take 1 tablet (150 mg total) by mouth daily. 30 tablet 0  . Calcium 600-200 MG-UNIT tablet Take 1 tablet by mouth  daily.    . cyclobenzaprine (FLEXERIL) 10 MG tablet Take 10 mg by mouth 3 (three) times daily as needed for muscle spasms.    . meloxicam (MOBIC) 15 MG tablet Take 15 mg by mouth daily.    . metFORMIN (GLUCOPHAGE) 500 MG tablet Take 1 tablet (500 mg total) by mouth daily with breakfast. 30 tablet 0  . Multiple Vitamins-Minerals (MULTIVITAMIN WITH MINERALS) tablet Take 1 tablet by mouth daily.    . naproxen sodium (ALEVE) 220 MG tablet Take 220 mg by mouth daily as needed.    . triamterene-hydrochlorothiazide (MAXZIDE-25) 37.5-25 MG per tablet Take 1 tablet by mouth  Daily.     No current facility-administered medications on file prior to visit.     PAST MEDICAL HISTORY: Past Medical History:  Diagnosis Date  . Arthritis   . Back pain   . 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 Hypertension   . Joint pain   . Leg edema   . Obesity    S/P lap band 2010  . Vitamin D deficiency     PAST SURGICAL HISTORY: Past Surgical History:  Procedure Laterality Date  . ABDOMINAL HYSTERECTOMY     s/p endometrial adenocarcinoma  . LAPAROSCOPIC GASTRIC BANDING  04/17/2009  . TOTAL KNEE ARTHROPLASTY Left 08/25/2016   Procedure: TOTAL KNEE ARTHROPLASTY;  Surgeon: Elsie Saas, MD;  Location: [facility];  Service: Orthopedics;  Laterality: Left;    SOCIAL HISTORY: Social History   Tobacco Use  . Smoking status: Never Smoker  . Smokeless tobacco: Never Used  Substance Use Topics  . Alcohol use: No  . Drug use: No    FAMILY HISTORY: Family History  Problem Relation Age of Onset  . Hypertension Mother   . Kidney disease Mother   . Hypertension Sister   . Hypertension Maternal Grandmother     ROS: Review of Systems  Constitutional: Negative for weight loss.  Gastrointestinal: Negative for nausea and vomiting.  Musculoskeletal:       Negative for muscle weakness.    PHYSICAL EXAM: Blood pressure 116/78, pulse 66, temperature (!) 97.5 F (36.4 C), temperature source Oral, height 5\' 1"  (1.549 m), weight 255 lb (115.7 kg), SpO2 100 %. Body mass index is 48.18 kg/m. Physical Exam Vitals signs reviewed.  Constitutional:      Appearance: Normal appearance. She is obese.  Cardiovascular:     Rate and Rhythm: Normal rate.  Pulmonary:     Effort: Pulmonary effort is normal.  Musculoskeletal: Normal range of motion.  Skin:    General: Skin is warm and dry.  Neurological:     Mental Status: She is alert and oriented to person, place, and time.  Psychiatric:        Mood and Affect: Mood normal.        Behavior: Behavior normal.     RECENT  LABS AND TESTS: BMET    Component Value Date/Time   NA 142 08/10/2018 1249   K 4.5 08/10/2018 1249   CL 101 08/10/2018 1249   CO2 26 08/10/2018 1249   GLUCOSE 76 08/10/2018 1249   GLUCOSE 125 (H) 08/27/2016 0505   BUN 16 08/10/2018 1249   CREATININE 0.93 08/10/2018 1249   CALCIUM 10.0 08/10/2018 1249   GFRNONAA 70 08/10/2018 1249   GFRAA 81 08/10/2018 1249   Lab Results  Component Value Date   HGBA1C 5.6 08/10/2018   HGBA1C 5.6 05/18/2018   HGBA1C 5.7 (H) 12/28/2017   HGBA1C 5.7 (H) 08/15/2016   Lab Results  Component Value Date   INSULIN 7.7 08/10/2018   INSULIN 14.4 05/18/2018   INSULIN 14.0 12/28/2017   CBC    Component Value Date/Time   WBC 5.6 12/28/2017 1006   WBC 14.6 (H) 08/27/2016 0505   RBC 4.91 12/28/2017 1006   RBC 4.04 08/27/2016 0505   HGB 12.7 12/28/2017 1006   HCT 39.2 12/28/2017 1006   PLT 247 08/27/2016 0505   MCV 80 12/28/2017 1006   MCH 25.9 (L) 12/28/2017 1006   MCH 26.2 08/27/2016 0505   MCHC 32.4 12/28/2017 1006   MCHC 32.2 08/27/2016 0505   RDW 15.9 (H) 12/28/2017 1006   LYMPHSABS 0.9 12/28/2017 1006   EOSABS 0.2 12/28/2017 1006   BASOSABS 0.0 12/28/2017 1006   Iron/TIBC/Ferritin/ %Sat No results found for: IRON, TIBC, FERRITIN, IRONPCTSAT Lipid Panel     Component Value Date/Time   CHOL 216 (H) 08/10/2018 1249   TRIG 120 08/10/2018 1249   HDL 61 08/10/2018 1249   LDLCALC 131 (H) 08/10/2018 1249   Hepatic Function Panel     Component Value Date/Time   PROT 6.4 08/10/2018 1249   ALBUMIN 4.0 08/10/2018 1249   AST 20 08/10/2018 1249   ALT 17 08/10/2018 1249   ALKPHOS 86 08/10/2018 1249   BILITOT 0.4 08/10/2018 1249      Component Value Date/Time   TSH 3.130 12/28/2017 1006   Results for MARYLAN, GLORE (MRN [id number]) as of 10/06/2018 10:22  Ref. Range 08/10/2018 12:49  Vitamin D, 25-Hydroxy Latest Ref Range: 30.0 - 100.0 ng/mL 22.5 (L)    OBESITY BEHAVIORAL INTERVENTION VISIT  Today's visit was # 13   Starting  weight: 261 lbs Starting date: 01/09/18 Today's weight : Weight: 255 lb (115.7 kg)  Today's date: 10/05/2018 Total lbs lost to date: 6  ASK: We discussed the diagnosis of obesity with [name] today and [name] agreed to give [name] permission to discuss obesity behavioral modification therapy today.  ASSESS: [name] has the diagnosis of obesity and her BMI today is 48.2. [name] is in the action stage of change.   ADVISE: [name] was educated on the multiple health risks of obesity as well as the benefit of weight loss to improve her health. She was advised of the need for long term treatment and the importance of lifestyle modifications to improve her current health and to decrease her risk of future health problems.  AGREE: Multiple dietary modification options and treatment options were discussed and [name] agreed to follow the recommendations documented in the above note.  ARRANGE: [name] was educated on the importance of frequent visits to treat obesity as outlined per CMS and USPSTF guidelines and agreed to schedule her next follow up appointment today.  I, Marcille Blanco, am acting as transcriptionist for Starlyn Skeans, MD  I have reviewed the above documentation for accuracy and completeness, and I agree with the above. -Dennard Nip, MD

## 2018-10-20 ENCOUNTER — Ambulatory Visit (INDEPENDENT_AMBULATORY_CARE_PROVIDER_SITE_OTHER): Payer: BLUE CROSS/BLUE SHIELD | Admitting: Family Medicine

## 2018-10-20 ENCOUNTER — Encounter (INDEPENDENT_AMBULATORY_CARE_PROVIDER_SITE_OTHER): Payer: Self-pay | Admitting: Family Medicine

## 2018-10-20 VITALS — BP 114/79 | HR 73 | Temp 98.2°F | Ht 61.0 in | Wt 250.0 lb

## 2018-10-20 DIAGNOSIS — Z6841 Body Mass Index (BMI) 40.0 and over, adult: Secondary | ICD-10-CM

## 2018-10-20 DIAGNOSIS — E559 Vitamin D deficiency, unspecified: Secondary | ICD-10-CM

## 2018-10-20 DIAGNOSIS — Z9189 Other specified personal risk factors, not elsewhere classified: Secondary | ICD-10-CM

## 2018-10-20 MED ORDER — VITAMIN D (ERGOCALCIFEROL) 1.25 MG (50000 UNIT) PO CAPS: 50000.0000 [IU] | ORAL_CAPSULE | ORAL | 0 refills | Status: DC

## 2018-10-21 NOTE — Progress Notes (Signed)
Office: [phone]  /  Fax: [phone]   HPI:   Chief Complaint: OBESITY [name] is here to discuss her progress with her obesity treatment plan. She is on the Category 2 plan keeping a food journal of 300 to 450 calories and 30+ grams of protein for lunch and is following her eating plan approximately 60 % of the time. She states she is exercising 0 minutes 0 times per week. [name] has done well with weight loss and has done better with her meal plan and prepping. She is journaling lunch well and doing pretty good meeting her calorie and protein goals.  Her weight is 250 lb (113.4 kg) today and has had a weight loss of 5 pounds over a period of 3 weeks since her last visit. She has lost 11 lbs since starting treatment with [name].  Vitamin D deficiency [name] has a diagnosis of vitamin D deficiency. She is doing well with remembering to take the vit D. Her vitamin D level is not at goal and she denies nausea, vomiting, or muscle weakness.  At risk for osteopenia and osteoporosis [name] is at higher risk of osteopenia and osteoporosis due to vitamin D deficiency.   ASSESSMENT AND PLAN:  Vitamin D deficiency - Plan: Vitamin D, Ergocalciferol, (DRISDOL) 1.25 MG (50000 UT) CAPS capsule  At risk for osteoporosis  Class 3 severe obesity with serious comorbidity and body mass index (BMI) of 45.0 to 49.9 in adult, unspecified obesity type ([place])  PLAN:  Vitamin D Deficiency [name] was informed that low vitamin D levels contributes to fatigue and are associated with obesity, breast, and colon cancer. She agrees to continue to take prescription Vit D @50 ,000 IU every week #4 with no refills and will follow up for routine testing of vitamin D, at least 2-3 times per year. She was informed of the risk of over-replacement of vitamin D and agrees to not increase her dose unless she discusses this with [name] first. [name] agrees to follow up in 2 to 3 weeks.  At risk for osteopenia and  osteoporosis [name] was given extended (15 minutes) osteoporosis prevention counseling today. [name] is at risk for osteopenia and osteoporosis due to her vitamin D deficiency. She was encouraged to take her vitamin D and follow her higher calcium diet and increase strengthening exercise to help strengthen her bones and decrease her risk of osteopenia and osteoporosis.  Obesity [name] is currently in the action stage of change. As such, her goal is to continue with weight loss efforts. She has agreed to keep a food journal with 300 to 400 calories and 30 grams of protein for lunch and follow the Category 2 plan. [name] has been instructed to work up to a goal of 150 minutes of combined cardio and strengthening exercise per week for weight loss and overall health benefits. We discussed the following Behavioral Modification Strategies today: increasing lean protein intake, decreasing simple carbohydrates, and better snacking choices.   [name] has agreed to follow up with our clinic in 2 to 3 weeks. She was informed of the importance of frequent follow up visits to maximize her success with intensive lifestyle modifications for her multiple health conditions.  ALLERGIES: Allergies  Allergen Reactions  . No Known Allergies     MEDICATIONS: Current Outpatient Medications on File Prior to Visit  Medication Sig Dispense Refill  . acetaminophen (TYLENOL) 325 MG tablet Take 2 tablets (650 mg total) by mouth every 6 (six) hours as needed for mild pain (or Fever >/=  101).    . amoxicillin (AMOXIL) 500 MG capsule Take 500 mg by mouth. Take 4 capsule by mouth prior to dental procedure    . aspirin EC 81 MG tablet Take 81 mg by mouth daily.    [name] Kitchen aspirin-acetaminophen-caffeine (EXCEDRIN MIGRAINE) 250-250-65 MG tablet Take by mouth every 6 (six) hours as needed for headache.    [name] Kitchen buPROPion (WELLBUTRIN SR) 150 MG 12 hr tablet Take 1 tablet (150 mg total) by mouth daily. 30 tablet 0  . Calcium 600-200  MG-UNIT tablet Take 1 tablet by mouth daily.    . cyclobenzaprine (FLEXERIL) 10 MG tablet Take 10 mg by mouth 3 (three) times daily as needed for muscle spasms.    . meloxicam (MOBIC) 15 MG tablet Take 15 mg by mouth daily.    . metFORMIN (GLUCOPHAGE) 500 MG tablet Take 1 tablet (500 mg total) by mouth daily with breakfast. 30 tablet 0  . Multiple Vitamins-Minerals (MULTIVITAMIN WITH MINERALS) tablet Take 1 tablet by mouth daily.    . naproxen sodium (ALEVE) 220 MG tablet Take 220 mg by mouth daily as needed.    . triamterene-hydrochlorothiazide (MAXZIDE-25) 37.5-25 MG per tablet Take 1 tablet by mouth Daily.     No current facility-administered medications on file prior to visit.     PAST MEDICAL HISTORY: Past Medical History:  Diagnosis Date  . Arthritis   . Back pain   . 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 Hypertension   . Joint pain   . Leg edema   . Obesity    S/P lap band 2010  . Vitamin D deficiency     PAST SURGICAL HISTORY: Past Surgical History:  Procedure Laterality Date  . ABDOMINAL HYSTERECTOMY     s/p endometrial adenocarcinoma  . LAPAROSCOPIC GASTRIC BANDING  04/17/2009  . TOTAL KNEE ARTHROPLASTY Left 08/25/2016   Procedure: TOTAL KNEE ARTHROPLASTY;  Surgeon: Elsie Saas, MD;  Location: [facility];  Service: Orthopedics;  Laterality: Left;    SOCIAL HISTORY: Social History   Tobacco Use  . Smoking status: Never Smoker  . Smokeless tobacco: Never Used  Substance Use Topics  . Alcohol use: No  . Drug use: No    FAMILY HISTORY: Family History  Problem Relation Age of Onset  . Hypertension Mother   . Kidney disease Mother   . Hypertension Sister   . Hypertension Maternal Grandmother    ROS: Review of Systems  Gastrointestinal: Negative for nausea and vomiting.  Musculoskeletal:       Negative for muscle weakness.   PHYSICAL EXAM: Blood pressure 114/79, pulse 73, temperature 98.2 F (36.8 C), temperature source Oral, height 5\' 1"  (1.549 m),  weight 250 lb (113.4 kg), SpO2 98 %. Body mass index is 47.24 kg/m. Physical Exam Vitals signs reviewed.  Constitutional:      Appearance: Normal appearance. She is obese.  Cardiovascular:     Rate and Rhythm: Normal rate.  Pulmonary:     Effort: Pulmonary effort is normal.  Musculoskeletal: Normal range of motion.  Skin:    General: Skin is warm and dry.  Neurological:     Mental Status: She is alert and oriented to person, place, and time.  Psychiatric:        Mood and Affect: Mood normal.        Behavior: Behavior normal.    RECENT LABS AND TESTS: BMET    Component Value Date/Time   NA 142 08/10/2018 1249   K 4.5 08/10/2018 1249   CL 101 08/10/2018  1249   CO2 26 08/10/2018 1249   GLUCOSE 76 08/10/2018 1249   GLUCOSE 125 (H) 08/27/2016 0505   BUN 16 08/10/2018 1249   CREATININE 0.93 08/10/2018 1249   CALCIUM 10.0 08/10/2018 1249   GFRNONAA 70 08/10/2018 1249   GFRAA 81 08/10/2018 1249   Lab Results  Component Value Date   HGBA1C 5.6 08/10/2018   HGBA1C 5.6 05/18/2018   HGBA1C 5.7 (H) 12/28/2017   HGBA1C 5.7 (H) 08/15/2016   Lab Results  Component Value Date   INSULIN 7.7 08/10/2018   INSULIN 14.4 05/18/2018   INSULIN 14.0 12/28/2017   CBC    Component Value Date/Time   WBC 5.6 12/28/2017 1006   WBC 14.6 (H) 08/27/2016 0505   RBC 4.91 12/28/2017 1006   RBC 4.04 08/27/2016 0505   HGB 12.7 12/28/2017 1006   HCT 39.2 12/28/2017 1006   PLT 247 08/27/2016 0505   MCV 80 12/28/2017 1006   MCH 25.9 (L) 12/28/2017 1006   MCH 26.2 08/27/2016 0505   MCHC 32.4 12/28/2017 1006   MCHC 32.2 08/27/2016 0505   RDW 15.9 (H) 12/28/2017 1006   LYMPHSABS 0.9 12/28/2017 1006   EOSABS 0.2 12/28/2017 1006   BASOSABS 0.0 12/28/2017 1006   Iron/TIBC/Ferritin/ %Sat No results found for: IRON, TIBC, FERRITIN, IRONPCTSAT Lipid Panel     Component Value Date/Time   CHOL 216 (H) 08/10/2018 1249   TRIG 120 08/10/2018 1249   HDL 61 08/10/2018 1249   LDLCALC 131 (H)  08/10/2018 1249   Hepatic Function Panel     Component Value Date/Time   PROT 6.4 08/10/2018 1249   ALBUMIN 4.0 08/10/2018 1249   AST 20 08/10/2018 1249   ALT 17 08/10/2018 1249   ALKPHOS 86 08/10/2018 1249   BILITOT 0.4 08/10/2018 1249      Component Value Date/Time   TSH 3.130 12/28/2017 1006   Results for DEMITA, TOBIA (MRN [id number]) as of 10/21/2018 07:00  Ref. Range 08/10/2018 12:49  Vitamin D, 25-Hydroxy Latest Ref Range: 30.0 - 100.0 ng/mL 22.5 (L)    OBESITY BEHAVIORAL INTERVENTION VISIT  Today's visit was # 14   Starting weight: 261 lbs Starting date: 12/28/17 Today's weight : Weight: 250 lb (113.4 kg)  Today's date: 10/20/2018 Total lbs lost to date: 65  ASK: We discussed the diagnosis of obesity with [name] today and Keren agreed to give [name] permission to discuss obesity behavioral modification therapy today.  ASSESS: Karmel has the diagnosis of obesity and her BMI today is 47.2. Kiasha is in the action stage of change.   ADVISE: Briggitte was educated on the multiple health risks of obesity as well as the benefit of weight loss to improve her health. She was advised of the need for long term treatment and the importance of lifestyle modifications to improve her current health and to decrease her risk of future health problems.  AGREE: Multiple dietary modification options and treatment options were discussed and Lizzy agreed to follow the recommendations documented in the above note.  ARRANGE: Kymber was educated on the importance of frequent visits to treat obesity as outlined per CMS and USPSTF guidelines and agreed to schedule her next follow up appointment today.  I, Marcille Blanco, am acting as transcriptionist for Starlyn Skeans, MD  I have reviewed the above documentation for accuracy and completeness, and I agree with the above. -Dennard Nip, MD

## 2018-10-27 ENCOUNTER — Other Ambulatory Visit (INDEPENDENT_AMBULATORY_CARE_PROVIDER_SITE_OTHER): Payer: Self-pay | Admitting: Family Medicine

## 2018-10-27 DIAGNOSIS — E559 Vitamin D deficiency, unspecified: Secondary | ICD-10-CM

## 2018-11-10 ENCOUNTER — Ambulatory Visit (INDEPENDENT_AMBULATORY_CARE_PROVIDER_SITE_OTHER): Payer: BLUE CROSS/BLUE SHIELD | Admitting: Family Medicine

## 2018-11-10 ENCOUNTER — Encounter (INDEPENDENT_AMBULATORY_CARE_PROVIDER_SITE_OTHER): Payer: Self-pay

## 2018-11-18 ENCOUNTER — Ambulatory Visit (INDEPENDENT_AMBULATORY_CARE_PROVIDER_SITE_OTHER): Payer: BLUE CROSS/BLUE SHIELD | Admitting: Family Medicine

## 2018-11-18 ENCOUNTER — Encounter (INDEPENDENT_AMBULATORY_CARE_PROVIDER_SITE_OTHER): Payer: Self-pay

## 2018-11-18 ENCOUNTER — Encounter (INDEPENDENT_AMBULATORY_CARE_PROVIDER_SITE_OTHER): Payer: Self-pay | Admitting: Family Medicine

## 2018-11-18 VITALS — BP 115/75 | HR 60 | Temp 98.1°F | Ht 61.0 in | Wt 253.0 lb

## 2018-11-18 DIAGNOSIS — Z9189 Other specified personal risk factors, not elsewhere classified: Secondary | ICD-10-CM | POA: Diagnosis not present

## 2018-11-18 DIAGNOSIS — R7303 Prediabetes: Secondary | ICD-10-CM

## 2018-11-18 DIAGNOSIS — Z6841 Body Mass Index (BMI) 40.0 and over, adult: Secondary | ICD-10-CM | POA: Diagnosis not present

## 2018-11-18 DIAGNOSIS — F3289 Other specified depressive episodes: Secondary | ICD-10-CM

## 2018-11-18 DIAGNOSIS — E559 Vitamin D deficiency, unspecified: Secondary | ICD-10-CM

## 2018-11-18 MED ORDER — METFORMIN HCL 500 MG PO TABS: 500.0000 mg | ORAL_TABLET | Freq: Every day | ORAL | 0 refills | Status: DC

## 2018-11-18 MED ORDER — VITAMIN D (ERGOCALCIFEROL) 1.25 MG (50000 UNIT) PO CAPS: 50000.0000 [IU] | ORAL_CAPSULE | ORAL | 0 refills | Status: DC

## 2018-11-18 MED ORDER — NALTREXONE-BUPROPION HCL ER 8-90 MG PO TB12: ORAL_TABLET | ORAL | 0 refills | Status: DC

## 2018-11-18 NOTE — Progress Notes (Signed)
Office: [phone]  /  Fax: [phone]   HPI:   Chief Complaint: OBESITY [name] is here to discuss her progress with her obesity treatment plan. She is on the Category 2 plan and is following her eating plan approximately 70 % of the time. She states she is exercising 0 minutes 0 times per week. Jun notes increased stress and increased emotional eating. She is bored with her plan and would like to look at other options.  Her weight is 253 lb (114.8 kg) today and has had a weight gain of 3 pounds over a period of 4 weeks since her last visit. She has lost 8 lbs since starting treatment with [name].  Pre-Diabetes [name] has a diagnosis of pre-diabetes based on her elevated Hgb A1c and was informed this puts her at greater risk of developing diabetes. She is stable on metformin currently and continues to work on diet and exercise to decrease risk of diabetes. She denies nausea, vomiting, or hypoglycemia.  At risk for diabetes [name] is at higher than average risk for developing diabetes due to her pre-diabetes and obesity. She currently denies polyuria or polydipsia.  Depression with emotional eating behaviors [name]'s mood has improved on Wellbutrin, but she is still struggling with emotional eating and using food for comfort to the extent that it is negatively impacting her health. [name] will be starting Contrave soon. She often snacks when she is not hungry. [name] sometimes feels she is out of control and then feels guilty that she made poor food choices. She has been working on behavior modification techniques to help reduce her emotional eating and has been somewhat successful. She shows no sign of suicidal or homicidal ideations.  Vitamin D deficiency [name] has a diagnosis of vitamin D deficiency. She is currently stable on vit D and is not yet at goal.  ASSESSMENT AND PLAN:  Prediabetes - Plan: metFORMIN (GLUCOPHAGE) 500 MG tablet  Vitamin D deficiency - Plan: Vitamin D,  Ergocalciferol, (DRISDOL) 1.25 MG (50000 UT) CAPS capsule  Other depression - with emotional eating  At risk for diabetes mellitus  Class 3 severe obesity with serious comorbidity and body mass index (BMI) of 45.0 to 49.9 in adult, unspecified obesity type ([place]) - Plan: Naltrexone-buPROPion HCl ER 8-90 MG TB12  PLAN:  Pre-Diabetes [name] will continue to work on weight loss, exercise, and decreasing simple carbohydrates in her diet to help decrease the risk of diabetes. She was informed that eating too many simple carbohydrates or too many calories at one sitting increases the likelihood of GI side effects. Carena agreed to continue taking metformin 500mg  qAM #30 with no refills and a prescription was written today. [name] agreed to follow up with [name] as directed to monitor her progress in 3 weeks.  Diabetes risk counseling [name] was given extended (15 minutes) diabetes prevention counseling today. She is 53 y.o. female and has risk factors for diabetes including pre-diabetes and obesity. We discussed intensive lifestyle modifications today with an emphasis on weight loss as well as increasing exercise and decreasing simple carbohydrates in her diet.  Vitamin D Deficiency [name] was informed that low vitamin D levels contributes to fatigue and are associated with obesity, breast, and colon cancer. She agrees to continue to take prescription Vit D @50 ,000 IU every week #4 with no refills and will follow up for routine testing of vitamin D, at least 2-3 times per year. She was informed of the risk of over-replacement of vitamin D and agrees to not increase  her dose unless she discusses this with [name] first. [name] agrees to follow up at the agreed upon time.  Depression with Emotional Eating Behaviors We discussed behavior modification techniques today to help [name] deal with her emotional eating and depression. She has agreed to hold Wellbutrin and to start Contrave. [name] agreed to follow up as  directed.  Obesity [name] is currently in the action stage of change. As such, her goal is to continue with weight loss efforts. She has agreed to follow a lower carbohydrate, vegetable, and lean protein rich diet plan. [name] has been instructed to work up to a goal of 150 minutes of combined cardio and strengthening exercise per week for weight loss and overall health benefits. We discussed the following Behavioral Modification Strategies today: increasing lean protein intake, decreasing simple carbohydrates, and emotional eating strategies. We discussed medication options and she agreed to start Contrave 8-90mg , 2 tablets po BID #120 with no refills and she will follow up in 3 weeks.  [name] has agreed to follow up with our clinic in 3 weeks. She was informed of the importance of frequent follow up visits to maximize her success with intensive lifestyle modifications for her multiple health conditions.  ALLERGIES: Allergies  Allergen Reactions  . No Known Allergies     MEDICATIONS: Current Outpatient Medications on File Prior to Visit  Medication Sig Dispense Refill  . acetaminophen (TYLENOL) 325 MG tablet Take 2 tablets (650 mg total) by mouth every 6 (six) hours as needed for mild pain (or Fever >/= 101).    [name] Kitchen amoxicillin (AMOXIL) 500 MG capsule Take 500 mg by mouth. Take 4 capsule by mouth prior to dental procedure    . aspirin EC 81 MG tablet Take 81 mg by mouth daily.    [name] Kitchen aspirin-acetaminophen-caffeine (EXCEDRIN MIGRAINE) 250-250-65 MG tablet Take by mouth every 6 (six) hours as needed for headache.    [name] Kitchen buPROPion (WELLBUTRIN SR) 150 MG 12 hr tablet Take 1 tablet (150 mg total) by mouth daily. 30 tablet 0  . Calcium 600-200 MG-UNIT tablet Take 1 tablet by mouth daily.    . cyclobenzaprine (FLEXERIL) 10 MG tablet Take 10 mg by mouth 3 (three) times daily as needed for muscle spasms.    . meloxicam (MOBIC) 15 MG tablet Take 15 mg by mouth daily.    . Multiple Vitamins-Minerals  (MULTIVITAMIN WITH MINERALS) tablet Take 1 tablet by mouth daily.    . naproxen sodium (ALEVE) 220 MG tablet Take 220 mg by mouth daily as needed.    . triamterene-hydrochlorothiazide (MAXZIDE-25) 37.5-25 MG per tablet Take 1 tablet by mouth Daily.     No current facility-administered medications on file prior to visit.     PAST MEDICAL HISTORY: Past Medical History:  Diagnosis Date  . Arthritis   . Back pain   . 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 Hypertension   . Joint pain   . Leg edema   . Obesity    S/P lap band 2010  . Vitamin D deficiency     PAST SURGICAL HISTORY: Past Surgical History:  Procedure Laterality Date  . ABDOMINAL HYSTERECTOMY     s/p endometrial adenocarcinoma  . LAPAROSCOPIC GASTRIC BANDING  04/17/2009  . TOTAL KNEE ARTHROPLASTY Left 08/25/2016   Procedure: TOTAL KNEE ARTHROPLASTY;  Surgeon: Elsie Saas, MD;  Location: [facility];  Service: Orthopedics;  Laterality: Left;    SOCIAL HISTORY: Social History   Tobacco Use  . Smoking status: Never Smoker  . Smokeless tobacco: Never  Used  Substance Use Topics  . Alcohol use: No  . Drug use: No    FAMILY HISTORY: Family History  Problem Relation Age of Onset  . Hypertension Mother   . Kidney disease Mother   . Hypertension Sister   . Hypertension Maternal Grandmother     ROS: Review of Systems  Constitutional: Negative for weight loss.  Gastrointestinal: Negative for nausea and vomiting.  Genitourinary:       Negative for polyuria.  Musculoskeletal:       .  Endo/Heme/Allergies: Negative for polydipsia.       Negative for hypoglycemia.    PHYSICAL EXAM: Blood pressure 115/75, pulse 60, temperature 98.1 F (36.7 C), temperature source Oral, height 5\' 1"  (1.549 m), weight 253 lb (114.8 kg), SpO2 100 %. Body mass index is 47.8 kg/m. Physical Exam Vitals signs reviewed.  Constitutional:      Appearance: Normal appearance. She is obese.  Cardiovascular:     Rate and Rhythm:  Normal rate.  Pulmonary:     Effort: Pulmonary effort is normal.  Musculoskeletal: Normal range of motion.  Skin:    General: Skin is warm and dry.  Neurological:     Mental Status: She is alert and oriented to person, place, and time.  Psychiatric:        Mood and Affect: Mood normal.        Behavior: Behavior normal.     RECENT LABS AND TESTS: BMET    Component Value Date/Time   NA 142 08/10/2018 1249   K 4.5 08/10/2018 1249   CL 101 08/10/2018 1249   CO2 26 08/10/2018 1249   GLUCOSE 76 08/10/2018 1249   GLUCOSE 125 (H) 08/27/2016 0505   BUN 16 08/10/2018 1249   CREATININE 0.93 08/10/2018 1249   CALCIUM 10.0 08/10/2018 1249   GFRNONAA 70 08/10/2018 1249   GFRAA 81 08/10/2018 1249   Lab Results  Component Value Date   HGBA1C 5.6 08/10/2018   HGBA1C 5.6 05/18/2018   HGBA1C 5.7 (H) 12/28/2017   HGBA1C 5.7 (H) 08/15/2016   Lab Results  Component Value Date   INSULIN 7.7 08/10/2018   INSULIN 14.4 05/18/2018   INSULIN 14.0 12/28/2017   CBC    Component Value Date/Time   WBC 5.6 12/28/2017 1006   WBC 14.6 (H) 08/27/2016 0505   RBC 4.91 12/28/2017 1006   RBC 4.04 08/27/2016 0505   HGB 12.7 12/28/2017 1006   HCT 39.2 12/28/2017 1006   PLT 247 08/27/2016 0505   MCV 80 12/28/2017 1006   MCH 25.9 (L) 12/28/2017 1006   MCH 26.2 08/27/2016 0505   MCHC 32.4 12/28/2017 1006   MCHC 32.2 08/27/2016 0505   RDW 15.9 (H) 12/28/2017 1006   LYMPHSABS 0.9 12/28/2017 1006   EOSABS 0.2 12/28/2017 1006   BASOSABS 0.0 12/28/2017 1006   Iron/TIBC/Ferritin/ %Sat No results found for: IRON, TIBC, FERRITIN, IRONPCTSAT Lipid Panel     Component Value Date/Time   CHOL 216 (H) 08/10/2018 1249   TRIG 120 08/10/2018 1249   HDL 61 08/10/2018 1249   LDLCALC 131 (H) 08/10/2018 1249   Hepatic Function Panel     Component Value Date/Time   PROT 6.4 08/10/2018 1249   ALBUMIN 4.0 08/10/2018 1249   AST 20 08/10/2018 1249   ALT 17 08/10/2018 1249   ALKPHOS 86 08/10/2018 1249    BILITOT 0.4 08/10/2018 1249      Component Value Date/Time   TSH 3.130 12/28/2017 1006   Results for [place], Keir  D (MRN [id number]) as of 11/18/2018 15:05  Ref. Range 08/10/2018 12:49  Vitamin D, 25-Hydroxy Latest Ref Range: 30.0 - 100.0 ng/mL 22.5 (L)    OBESITY BEHAVIORAL INTERVENTION VISIT  Today's visit was # 15   Starting weight: 261 lbs Starting date: 12/28/17 Today's weight : Weight: 253 lb (114.8 kg)  Today's date: 11/18/2018 Total lbs lost to date: 8   11/18/2018  Height 5\' 1"  (1.549 m)  Weight 253 lb (114.8 kg)  BMI (Calculated) 47.83  BLOOD PRESSURE - SYSTOLIC 616  BLOOD PRESSURE - DIASTOLIC 75   Body Fat % 83.7 %  Total Body Water (lbs) 91.6 lbs   ASK: We discussed the diagnosis of obesity with [name] today and [name] agreed to give [name] permission to discuss obesity behavioral modification therapy today.  ASSESS: [name] has the diagnosis of obesity and her BMI today is 47.8. [name] is in the action stage of change.   ADVISE: [name] was educated on the multiple health risks of obesity as well as the benefit of weight loss to improve her health. She was advised of the need for long term treatment and the importance of lifestyle modifications to improve her current health and to decrease her risk of future health problems.  AGREE: Multiple dietary modification options and treatment options were discussed and [name] agreed to follow the recommendations documented in the above note.  ARRANGE: [name] was educated on the importance of frequent visits to treat obesity as outlined per CMS and USPSTF guidelines and agreed to schedule her next follow up appointment today.  IMarcille Blanco, CMA, am acting as transcriptionist for Starlyn Skeans, MD  I have reviewed the above documentation for accuracy and completeness, and I agree with the above. -Dennard Nip, MD

## 2018-12-09 ENCOUNTER — Ambulatory Visit (INDEPENDENT_AMBULATORY_CARE_PROVIDER_SITE_OTHER): Payer: BLUE CROSS/BLUE SHIELD | Admitting: Family Medicine

## 2018-12-10 ENCOUNTER — Other Ambulatory Visit (INDEPENDENT_AMBULATORY_CARE_PROVIDER_SITE_OTHER): Payer: Self-pay | Admitting: Family Medicine

## 2018-12-10 DIAGNOSIS — R7303 Prediabetes: Secondary | ICD-10-CM

## 2018-12-13 ENCOUNTER — Encounter (INDEPENDENT_AMBULATORY_CARE_PROVIDER_SITE_OTHER): Payer: Self-pay | Admitting: Family Medicine

## 2018-12-13 ENCOUNTER — Other Ambulatory Visit: Payer: Self-pay

## 2018-12-13 ENCOUNTER — Ambulatory Visit (INDEPENDENT_AMBULATORY_CARE_PROVIDER_SITE_OTHER): Payer: BLUE CROSS/BLUE SHIELD | Admitting: Family Medicine

## 2018-12-13 VITALS — BP 106/73 | HR 57 | Ht 61.0 in | Wt 252.0 lb

## 2018-12-13 DIAGNOSIS — Z9189 Other specified personal risk factors, not elsewhere classified: Secondary | ICD-10-CM

## 2018-12-13 DIAGNOSIS — F3289 Other specified depressive episodes: Secondary | ICD-10-CM

## 2018-12-13 DIAGNOSIS — Z6841 Body Mass Index (BMI) 40.0 and over, adult: Secondary | ICD-10-CM | POA: Diagnosis not present

## 2018-12-13 MED ORDER — BUPROPION HCL ER (SR) 200 MG PO TB12: 200.0000 mg | ORAL_TABLET | Freq: Every day | ORAL | 0 refills | Status: DC

## 2018-12-13 NOTE — Progress Notes (Signed)
Office: [phone]  /  Fax: [phone]   HPI:   Chief Complaint: OBESITY [name] is here to discuss her progress with her obesity treatment plan. She is on the Category 2 plan and is following her eating plan approximately 40% of the time. She states she is taking swim classes 40-60 minutes 2 times per week. [name] reports she is struggling to follow her plan due to increased stress eating. She states she sometimes skips meals and then overeats in compensation. Her weight is 252 lb (114.3 kg) today and has had a weight loss of 1 pound over a period of 4 weeks since her last visit. She has lost 9 lbs since starting treatment with [name].  Depression with emotional eating behaviors [name] is struggling with emotional eating and using food for comfort to the extent that it is negatively impacting her health. She often snacks when she is not hungry. [name] sometimes feels she is out of control and then feels guilty that she made poor food choices. She has been working on behavior modification techniques to help reduce her emotional eating and has been somewhat successful. [name] is stable on Wellbutrin but is still struggling with emotional eating. Her blood pressure is stable and she reports no insomnia.  Depression screen PHQ 2/9 12/28/2017  Decreased Interest 1  Down, Depressed, Hopeless 0  PHQ - 2 Score 1  Altered sleeping 0  Tired, decreased energy 1  Change in appetite 1  Feeling bad or failure about yourself  0  Trouble concentrating 0  Moving slowly or fidgety/restless 0  Suicidal thoughts 0  PHQ-9 Score 3  Difficult doing work/chores Not difficult at all   ASSESSMENT AND PLAN:  Other depression - with emotional eating - Plan: buPROPion (WELLBUTRIN SR) 200 MG 12 hr tablet  At risk for heart disease  Class 3 severe obesity with serious comorbidity and body mass index (BMI) of 45.0 to 49.9 in adult, unspecified obesity type (HCC)  PLAN:  Depression with Emotional Eating  Behaviors We discussed behavior modification techniques today to help [name] deal with her emotional eating and depression. [name] was advised to increase her Wellbutrin to 200 mg qam #30 and she agrees to follow-up with our clinic in 3 weeks.  Obesity [name] is currently in the action stage of change. As such, her goal is to continue with weight loss efforts. She has agreed to follow the Category 2 plan. [name] has been instructed to work up to a goal of 150 minutes of combined cardio and strengthening exercise per week for weight loss and overall health benefits. We discussed the following Behavioral Modification Strategies today: increasing lean protein intake, decreasing simple carbohydrates, and work on meal planning and easy cooking plans  [name] has agreed to follow-up with our clinic in 3 weeks. She was informed of the importance of frequent follow-up visits to maximize her success with intensive lifestyle modifications for her multiple health conditions.  ALLERGIES: Allergies  Allergen Reactions  . No Known Allergies     MEDICATIONS: Current Outpatient Medications on File Prior to Visit  Medication Sig Dispense Refill  . acetaminophen (TYLENOL) 325 MG tablet Take 2 tablets (650 mg total) by mouth every 6 (six) hours as needed for mild pain (or Fever >/= 101).    [name] Kitchen amoxicillin (AMOXIL) 500 MG capsule Take 500 mg by mouth. Take 4 capsule by mouth prior to dental procedure    . aspirin EC 81 MG tablet Take 81 mg by mouth daily.    [name] Kitchen  aspirin-acetaminophen-caffeine (EXCEDRIN MIGRAINE) 250-250-65 MG tablet Take by mouth every 6 (six) hours as needed for headache.    . Calcium 600-200 MG-UNIT tablet Take 1 tablet by mouth daily.    . cyclobenzaprine (FLEXERIL) 10 MG tablet Take 10 mg by mouth 3 (three) times daily as needed for muscle spasms.    . meloxicam (MOBIC) 15 MG tablet Take 15 mg by mouth daily.    . metFORMIN (GLUCOPHAGE) 500 MG tablet Take 1 tablet (500 mg total) by mouth  daily with breakfast. 30 tablet 0  . Multiple Vitamins-Minerals (MULTIVITAMIN WITH MINERALS) tablet Take 1 tablet by mouth daily.    . naproxen sodium (ALEVE) 220 MG tablet Take 220 mg by mouth daily as needed.    . triamterene-hydrochlorothiazide (MAXZIDE-25) 37.5-25 MG per tablet Take 1 tablet by mouth Daily.    . Vitamin D, Ergocalciferol, (DRISDOL) 1.25 MG (50000 UT) CAPS capsule Take 1 capsule (50,000 Units total) by mouth every 7 (seven) days. 4 capsule 0   No current facility-administered medications on file prior to visit.     PAST MEDICAL HISTORY: Past Medical History:  Diagnosis Date  . Arthritis   . Back pain   . 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 Hypertension   . Joint pain   . Leg edema   . Obesity    S/P lap band 2010  . Vitamin D deficiency     PAST SURGICAL HISTORY: Past Surgical History:  Procedure Laterality Date  . ABDOMINAL HYSTERECTOMY     s/p endometrial adenocarcinoma  . LAPAROSCOPIC GASTRIC BANDING  04/17/2009  . TOTAL KNEE ARTHROPLASTY Left 08/25/2016   Procedure: TOTAL KNEE ARTHROPLASTY;  Surgeon: Elsie Saas, MD;  Location: [facility];  Service: Orthopedics;  Laterality: Left;    SOCIAL HISTORY: Social History   Tobacco Use  . Smoking status: Never Smoker  . Smokeless tobacco: Never Used  Substance Use Topics  . Alcohol use: No  . Drug use: No    FAMILY HISTORY: Family History  Problem Relation Age of Onset  . Hypertension Mother   . Kidney disease Mother   . Hypertension Sister   . Hypertension Maternal Grandmother    ROS: Review of Systems  Constitutional: Positive for weight loss.  Psychiatric/Behavioral: Positive for depression (emotional eating). The patient does not have insomnia.    PHYSICAL EXAM: Blood pressure 106/73, pulse (!) 57, height 5\' 1"  (1.549 m), weight 252 lb (114.3 kg), SpO2 100 %. Body mass index is 47.61 kg/m. Physical Exam Vitals signs reviewed.  Constitutional:      Appearance: Normal appearance.  She is obese.  Cardiovascular:     Rate and Rhythm: Normal rate.     Pulses: Normal pulses.  Pulmonary:     Effort: Pulmonary effort is normal.     Breath sounds: Normal breath sounds.  Musculoskeletal: Normal range of motion.  Skin:    General: Skin is warm and dry.  Neurological:     Mental Status: She is alert and oriented to person, place, and time.  Psychiatric:        Behavior: Behavior normal.     Comments: Struggles with emotional eating.   RECENT LABS AND TESTS: BMET    Component Value Date/Time   NA 142 08/10/2018 1249   K 4.5 08/10/2018 1249   CL 101 08/10/2018 1249   CO2 26 08/10/2018 1249   GLUCOSE 76 08/10/2018 1249   GLUCOSE 125 (H) 08/27/2016 0505   BUN 16 08/10/2018 1249   CREATININE 0.93 08/10/2018 1249  CALCIUM 10.0 08/10/2018 1249   GFRNONAA 70 08/10/2018 1249   GFRAA 81 08/10/2018 1249   Lab Results  Component Value Date   HGBA1C 5.6 08/10/2018   HGBA1C 5.6 05/18/2018   HGBA1C 5.7 (H) 12/28/2017   HGBA1C 5.7 (H) 08/15/2016   Lab Results  Component Value Date   INSULIN 7.7 08/10/2018   INSULIN 14.4 05/18/2018   INSULIN 14.0 12/28/2017   CBC    Component Value Date/Time   WBC 5.6 12/28/2017 1006   WBC 14.6 (H) 08/27/2016 0505   RBC 4.91 12/28/2017 1006   RBC 4.04 08/27/2016 0505   HGB 12.7 12/28/2017 1006   HCT 39.2 12/28/2017 1006   PLT 247 08/27/2016 0505   MCV 80 12/28/2017 1006   MCH 25.9 (L) 12/28/2017 1006   MCH 26.2 08/27/2016 0505   MCHC 32.4 12/28/2017 1006   MCHC 32.2 08/27/2016 0505   RDW 15.9 (H) 12/28/2017 1006   LYMPHSABS 0.9 12/28/2017 1006   EOSABS 0.2 12/28/2017 1006   BASOSABS 0.0 12/28/2017 1006   Iron/TIBC/Ferritin/ %Sat No results found for: IRON, TIBC, FERRITIN, IRONPCTSAT Lipid Panel     Component Value Date/Time   CHOL 216 (H) 08/10/2018 1249   TRIG 120 08/10/2018 1249   HDL 61 08/10/2018 1249   LDLCALC 131 (H) 08/10/2018 1249   Hepatic Function Panel     Component Value Date/Time   PROT 6.4  08/10/2018 1249   ALBUMIN 4.0 08/10/2018 1249   AST 20 08/10/2018 1249   ALT 17 08/10/2018 1249   ALKPHOS 86 08/10/2018 1249   BILITOT 0.4 08/10/2018 1249      Component Value Date/Time   TSH 3.130 12/28/2017 1006   Results for QUINN, BARTLING (MRN [id number]) as of 12/13/2018 14:47  Ref. Range 08/10/2018 12:49  Vitamin D, 25-Hydroxy Latest Ref Range: 30.0 - 100.0 ng/mL 22.5 (L)   OBESITY BEHAVIORAL INTERVENTION VISIT  Today's visit was #16  Starting weight: 261 lbs Starting date: 12/28/2017 Today's weight: 252 lbs Today's date: 12/13/2018 Total lbs lost to date: 9    12/13/2018  Height 5\' 1"  (1.549 m)  Weight 252 lb (114.3 kg)  BMI (Calculated) 47.64  BLOOD PRESSURE - SYSTOLIC 237  BLOOD PRESSURE - DIASTOLIC 73   Body Fat % 62.8 %  Total Body Water (lbs) 90.4 lbs   ASK: We discussed the diagnosis of obesity with [name] today and [name] agreed to give [name] permission to discuss obesity behavioral modification therapy today.  ASSESS: [name] has the diagnosis of obesity and her BMI today is 47.64. [name] is in the action stage of change.   ADVISE: [name] was educated on the multiple health risks of obesity as well as the benefit of weight loss to improve her health. She was advised of the need for long term treatment and the importance of lifestyle modifications to improve her current health and to decrease her risk of future health problems.  AGREE: Multiple dietary modification options and treatment options were discussed and  [name] agreed to follow the recommendations documented in the above note.  ARRANGE: [name] was educated on the importance of frequent visits to treat obesity as outlined per CMS and USPSTF guidelines and agreed to schedule her next follow-up appointment today.  I, Michaelene Song, am acting as Location manager for Dennard Nip, MD  I have reviewed the above documentation for accuracy and completeness, and I agree with the above. -Dennard Nip, MD

## 2018-12-15 ENCOUNTER — Other Ambulatory Visit (INDEPENDENT_AMBULATORY_CARE_PROVIDER_SITE_OTHER): Payer: Self-pay | Admitting: Family Medicine

## 2018-12-15 DIAGNOSIS — E559 Vitamin D deficiency, unspecified: Secondary | ICD-10-CM

## 2018-12-21 ENCOUNTER — Encounter (INDEPENDENT_AMBULATORY_CARE_PROVIDER_SITE_OTHER): Payer: Self-pay

## 2019-01-03 ENCOUNTER — Other Ambulatory Visit: Payer: Self-pay

## 2019-01-03 ENCOUNTER — Other Ambulatory Visit (INDEPENDENT_AMBULATORY_CARE_PROVIDER_SITE_OTHER): Payer: Self-pay | Admitting: Family Medicine

## 2019-01-03 ENCOUNTER — Ambulatory Visit (INDEPENDENT_AMBULATORY_CARE_PROVIDER_SITE_OTHER): Payer: BLUE CROSS/BLUE SHIELD | Admitting: Family Medicine

## 2019-01-03 ENCOUNTER — Encounter (INDEPENDENT_AMBULATORY_CARE_PROVIDER_SITE_OTHER): Payer: Self-pay | Admitting: Family Medicine

## 2019-01-03 DIAGNOSIS — F3289 Other specified depressive episodes: Secondary | ICD-10-CM | POA: Diagnosis not present

## 2019-01-03 DIAGNOSIS — Z6841 Body Mass Index (BMI) 40.0 and over, adult: Secondary | ICD-10-CM

## 2019-01-03 DIAGNOSIS — R7303 Prediabetes: Secondary | ICD-10-CM

## 2019-01-03 DIAGNOSIS — E559 Vitamin D deficiency, unspecified: Secondary | ICD-10-CM

## 2019-01-03 MED ORDER — BUPROPION HCL ER (SR) 200 MG PO TB12: 200.0000 mg | ORAL_TABLET | Freq: Every day | ORAL | 0 refills | Status: DC

## 2019-01-03 MED ORDER — VITAMIN D (ERGOCALCIFEROL) 1.25 MG (50000 UNIT) PO CAPS: 50000.0000 [IU] | ORAL_CAPSULE | ORAL | 0 refills | Status: DC

## 2019-01-03 MED ORDER — METFORMIN HCL 500 MG PO TABS: 500.0000 mg | ORAL_TABLET | Freq: Every day | ORAL | 0 refills | Status: DC

## 2019-01-03 NOTE — Progress Notes (Signed)
Office: [phone]  /  Fax: [phone] TeleHealth Visit:  [name] has verbally consented to this TeleHealth visit today. The patient is located at home, the provider is located at the [facility] office. The participants in this visit include the listed provider and patient. Lov[facility] was unable to use Webex today and the Telehealth visit was conducted via telephone.  HPI:   Chief Complaint: OBESITY [name] is here to discuss her progress with her obesity treatment plan. She is on the Category 2 plan and is following her eating plan approximately 50 % of the time. She states she is walking 2 to 3 times for 30 minutes 7 times per week. [name] continues to work on diet and exercise and feels that she has lost 3 to 4 pounds since her last visit. She is working from home.   We were unable to weigh the patient today for this TeleHealth visit. She feels as if she has lost weight since her last visit. She has lost 9 lbs since starting treatment with [name].  Depression with emotional eating behaviors [name] increased her dose to 200 mg qAM and feels that she is doing better with avoiding temptations, emotional eating, and using food for comfort to the extent that it is negatively impacting her health. She has been working on behavior modification techniques to help reduce her emotional eating and has been somewhat successful. She denies insomnia or palpitations.  Vitamin D Deficiency [name] has a diagnosis of vitamin D deficiency. She is currently stable on vit D, but is not yet at goal.   Pre-Diabetes [name] has a diagnosis of pre-diabetes based on her elevated Hgb A1c and was informed this puts her at greater risk of developing diabetes. She continues to do well on her diet prescription and is taking metformin currently. She continues to work on diet and exercise to decrease risk of diabetes. She notes decreased polyphagia, especially in the afternoon.  ASSESSMENT AND PLAN:   Vitamin D deficiency - Plan: Vitamin D, Ergocalciferol, (DRISDOL) 1.25 MG (50000 UT) CAPS capsule  Prediabetes - Plan: metFORMIN (GLUCOPHAGE) 500 MG tablet  Other depression - with emotional eating - Plan: buPROPion (WELLBUTRIN SR) 200 MG 12 hr tablet  Class 3 severe obesity with serious comorbidity and body mass index (BMI) of 45.0 to 49.9 in adult, unspecified obesity type ([place])  PLAN:  Vitamin D Deficiency [name] was informed that low vitamin D levels contribute to fatigue and are associated with obesity, breast, and colon cancer. [name] agrees to continue to take prescription Vit D @50 ,000 IU every week #4 with no refills and will follow up for routine testing of vitamin D, at least 2-3 times per year. She was informed of the risk of over-replacement of vitamin D and agrees to not increase her dose unless she discusses this with [name] first. [name] agrees to follow up in 3 weeks as directed.  Pre-Diabetes [name] will continue to work on weight loss, exercise, and decreasing simple carbohydrates in her diet to help decrease the risk of diabetes. She was informed that eating too many simple carbohydrates or too many calories at one sitting increases the likelihood of GI side effects. [name] agreed to continue metformin 500 mg qAM #30 with no refills and a prescription was written today. [name] agreed to follow up with [name] as directed to monitor her progress in 3 weeks.   Depression with Emotional Eating Behaviors We discussed behavior modification techniques today to help [name] deal with her emotional  eating and depression. She has agreed to take Wellbutrin SR 200 mg qd #30 with no refills and agreed to follow up as directed.  Obesity [name] is currently in the action stage of change. As such, her goal is to continue with weight loss efforts. She has agreed to follow the Category 2 plan. [name] has been instructed to work up to a goal of 150 minutes of combined cardio and strengthening  exercise per week for weight loss and overall health benefits. We discussed the following Behavioral Modification Strategies today: emotional eating strategies, better snack strategies, and ways to avoid boredom eating.  [name] has agreed to follow up with our clinic in 3 weeks. She was informed of the importance of frequent follow up visits to maximize her success with intensive lifestyle modifications for her multiple health conditions.  ALLERGIES: Allergies  Allergen Reactions  . No Known Allergies     MEDICATIONS: Current Outpatient Medications on File Prior to Visit  Medication Sig Dispense Refill  . acetaminophen (TYLENOL) 325 MG tablet Take 2 tablets (650 mg total) by mouth every 6 (six) hours as needed for mild pain (or Fever >/= 101).    [name] Kitchen amoxicillin (AMOXIL) 500 MG capsule Take 500 mg by mouth. Take 4 capsule by mouth prior to dental procedure    . aspirin EC 81 MG tablet Take 81 mg by mouth daily.    [name] Kitchen aspirin-acetaminophen-caffeine (EXCEDRIN MIGRAINE) 250-250-65 MG tablet Take by mouth every 6 (six) hours as needed for headache.    . Calcium 600-200 MG-UNIT tablet Take 1 tablet by mouth daily.    . cyclobenzaprine (FLEXERIL) 10 MG tablet Take 10 mg by mouth 3 (three) times daily as needed for muscle spasms.    . meloxicam (MOBIC) 15 MG tablet Take 15 mg by mouth daily.    . Multiple Vitamins-Minerals (MULTIVITAMIN WITH MINERALS) tablet Take 1 tablet by mouth daily.    . naproxen sodium (ALEVE) 220 MG tablet Take 220 mg by mouth daily as needed.    . triamterene-hydrochlorothiazide (MAXZIDE-25) 37.5-25 MG per tablet Take 1 tablet by mouth Daily.     No current facility-administered medications on file prior to visit.     PAST MEDICAL HISTORY: Past Medical History:  Diagnosis Date  . Arthritis   . Back pain   . 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 Hypertension   . Joint pain   . Leg edema   . Obesity    S/P lap band 2010  . Vitamin D deficiency     PAST  SURGICAL HISTORY: Past Surgical History:  Procedure Laterality Date  . ABDOMINAL HYSTERECTOMY     s/p endometrial adenocarcinoma  . LAPAROSCOPIC GASTRIC BANDING  04/17/2009  . TOTAL KNEE ARTHROPLASTY Left 08/25/2016   Procedure: TOTAL KNEE ARTHROPLASTY;  Surgeon: Elsie Saas, MD;  Location: [facility];  Service: Orthopedics;  Laterality: Left;    SOCIAL HISTORY: Social History   Tobacco Use  . Smoking status: Never Smoker  . Smokeless tobacco: Never Used  Substance Use Topics  . Alcohol use: No  . Drug use: No    FAMILY HISTORY: Family History  Problem Relation Age of Onset  . Hypertension Mother   . Kidney disease Mother   . Hypertension Sister   . Hypertension Maternal Grandmother     ROS: Review of Systems  Cardiovascular: Negative for palpitations.  Endo/Heme/Allergies:       Positive for polyphagia.  Psychiatric/Behavioral: Positive for depression. The patient does not have insomnia.  PHYSICAL EXAM: Pt in no acute distress  RECENT LABS AND TESTS: BMET    Component Value Date/Time   NA 142 08/10/2018 1249   K 4.5 08/10/2018 1249   CL 101 08/10/2018 1249   CO2 26 08/10/2018 1249   GLUCOSE 76 08/10/2018 1249   GLUCOSE 125 (H) 08/27/2016 0505   BUN 16 08/10/2018 1249   CREATININE 0.93 08/10/2018 1249   CALCIUM 10.0 08/10/2018 1249   GFRNONAA 70 08/10/2018 1249   GFRAA 81 08/10/2018 1249   Lab Results  Component Value Date   HGBA1C 5.6 08/10/2018   HGBA1C 5.6 05/18/2018   HGBA1C 5.7 (H) 12/28/2017   HGBA1C 5.7 (H) 08/15/2016   Lab Results  Component Value Date   INSULIN 7.7 08/10/2018   INSULIN 14.4 05/18/2018   INSULIN 14.0 12/28/2017   CBC    Component Value Date/Time   WBC 5.6 12/28/2017 1006   WBC 14.6 (H) 08/27/2016 0505   RBC 4.91 12/28/2017 1006   RBC 4.04 08/27/2016 0505   HGB 12.7 12/28/2017 1006   HCT 39.2 12/28/2017 1006   PLT 247 08/27/2016 0505   MCV 80 12/28/2017 1006   MCH 25.9 (L) 12/28/2017 1006   MCH 26.2 08/27/2016  0505   MCHC 32.4 12/28/2017 1006   MCHC 32.2 08/27/2016 0505   RDW 15.9 (H) 12/28/2017 1006   LYMPHSABS 0.9 12/28/2017 1006   EOSABS 0.2 12/28/2017 1006   BASOSABS 0.0 12/28/2017 1006   Iron/TIBC/Ferritin/ %Sat No results found for: IRON, TIBC, FERRITIN, IRONPCTSAT Lipid Panel     Component Value Date/Time   CHOL 216 (H) 08/10/2018 1249   TRIG 120 08/10/2018 1249   HDL 61 08/10/2018 1249   LDLCALC 131 (H) 08/10/2018 1249   Hepatic Function Panel     Component Value Date/Time   PROT 6.4 08/10/2018 1249   ALBUMIN 4.0 08/10/2018 1249   AST 20 08/10/2018 1249   ALT 17 08/10/2018 1249   ALKPHOS 86 08/10/2018 1249   BILITOT 0.4 08/10/2018 1249      Component Value Date/Time   TSH 3.130 12/28/2017 1006   Results for NYKERRIA, MACCONNELL (MRN [id number]) as of 01/03/2019 11:42  Ref. Range 08/10/2018 12:49  Vitamin D, 25-Hydroxy Latest Ref Range: 30.0 - 100.0 ng/mL 22.5 (L)     I, Marcille Blanco, CMA, am acting as transcriptionist for Starlyn Skeans, MD I have reviewed the above documentation for accuracy and completeness, and I agree with the above. -Dennard Nip, MD

## 2019-01-24 ENCOUNTER — Ambulatory Visit (INDEPENDENT_AMBULATORY_CARE_PROVIDER_SITE_OTHER): Payer: BLUE CROSS/BLUE SHIELD | Admitting: Family Medicine

## 2019-01-24 ENCOUNTER — Other Ambulatory Visit: Payer: Self-pay

## 2019-01-24 ENCOUNTER — Encounter (INDEPENDENT_AMBULATORY_CARE_PROVIDER_SITE_OTHER): Payer: Self-pay | Admitting: Family Medicine

## 2019-01-24 DIAGNOSIS — Z6841 Body Mass Index (BMI) 40.0 and over, adult: Secondary | ICD-10-CM | POA: Diagnosis not present

## 2019-01-24 DIAGNOSIS — I10 Essential (primary) hypertension: Secondary | ICD-10-CM | POA: Diagnosis not present

## 2019-01-24 NOTE — Progress Notes (Signed)
Office: [phone]  /  Fax: [phone] TeleHealth Visit:  Clemon [name] has verbally consented to this TeleHealth visit today. The patient is located at home, the provider is located at the [facility] office. The participants in this visit include the listed provider and patient. [name] was unable to use realtime audiovisual technology today and the telehealth visit was conducted via telephone.  HPI:   Chief Complaint: OBESITY [name] is here to discuss her progress with her obesity treatment plan. She is on the  Category 2 plan and is following her eating plan approximately 60 % of the time. She states she is walking 30 to 40 minutes 7 times per week. [name] has done well maintaining her weight. She is working from home and taking online classes, so she is keeping herself busy, but she is still struggling with snacking at times.  We were unable to weigh the patient today for this TeleHealth visit. She feels as if she has maintained weight since her last visit. She has lost 9 lbs since starting treatment with [name].  Hypertension ALEXX GIAMBRA is a 53 y.o. female with hypertension. [name] is on Maxzide 37.5-25 mg and her blood pressure was 122/88 at home and continues to be well controlled. She is working on diet, exercise, and her goal is to improve her blood pressure with less medicines and decreasing her risk of heart attack and stroke. [name] denies chest pain, headache, or lightheadedness.  ASSESSMENT AND PLAN:  Essential hypertension  Class 3 severe obesity with serious comorbidity and body mass index (BMI) of 45.0 to 49.9 in adult, unspecified obesity type ([place])  PLAN:  Hypertension We discussed sodium restriction, working on healthy weight loss, and a regular exercise program as the means to achieve improved blood pressure control. We will continue to monitor her blood pressure as well as her progress with the above lifestyle modifications. She will continue her  diet, exercise, and medications as prescribed and will watch for signs of hypotension as she continues her lifestyle modifications. [name] agreed with this plan and we will continue to monitor her progress. [name] agreed to follow up as directed in 3 weeks.  I spent > than 50% of the 15 minute visit on counseling as documented in the note.  Obesity [name] is currently in the action stage of change. As such, her goal is to continue with weight loss efforts. She has agreed to follow the Category 2 plan. [name] has been instructed to work up to a goal of 150 minutes of combined cardio and strengthening exercise per week for weight loss and overall health benefits. We discussed the following Behavioral Modification Strategies today: work on meal planning and easy cooking plans, emotional eating strategies, ways to avoid boredom eating, and ways to avoid night time snacking.  [name] has agreed to follow up with our clinic in 3 weeks. She was informed of the importance of frequent follow up visits to maximize her success with intensive lifestyle modifications for her multiple health conditions.  ALLERGIES: Allergies  Allergen Reactions  . No Known Allergies     MEDICATIONS: Current Outpatient Medications on File Prior to Visit  Medication Sig Dispense Refill  . acetaminophen (TYLENOL) 325 MG tablet Take 2 tablets (650 mg total) by mouth every 6 (six) hours as needed for mild pain (or Fever >/= 101).    [name] Kitchen amoxicillin (AMOXIL) 500 MG capsule Take 500 mg by mouth. Take 4 capsule by mouth prior to dental procedure    .  aspirin EC 81 MG tablet Take 81 mg by mouth daily.    [name] Kitchen aspirin-acetaminophen-caffeine (EXCEDRIN MIGRAINE) 250-250-65 MG tablet Take by mouth every 6 (six) hours as needed for headache.    [name] Kitchen buPROPion (WELLBUTRIN SR) 200 MG 12 hr tablet Take 1 tablet (200 mg total) by mouth daily. 30 tablet 0  . Calcium 600-200 MG-UNIT tablet Take 1 tablet by mouth daily.    . cyclobenzaprine  (FLEXERIL) 10 MG tablet Take 10 mg by mouth 3 (three) times daily as needed for muscle spasms.    . meloxicam (MOBIC) 15 MG tablet Take 15 mg by mouth daily.    . metFORMIN (GLUCOPHAGE) 500 MG tablet Take 1 tablet (500 mg total) by mouth daily with breakfast. 30 tablet 0  . Multiple Vitamins-Minerals (MULTIVITAMIN WITH MINERALS) tablet Take 1 tablet by mouth daily.    . naproxen sodium (ALEVE) 220 MG tablet Take 220 mg by mouth daily as needed.    . triamterene-hydrochlorothiazide (MAXZIDE-25) 37.5-25 MG per tablet Take 1 tablet by mouth Daily.    . Vitamin D, Ergocalciferol, (DRISDOL) 1.25 MG (50000 UT) CAPS capsule Take 1 capsule (50,000 Units total) by mouth every 7 (seven) days. 4 capsule 0   No current facility-administered medications on file prior to visit.     PAST MEDICAL HISTORY: Past Medical History:  Diagnosis Date  . Arthritis   . Back pain   . 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 Hypertension   . Joint pain   . Leg edema   . Obesity    S/P lap band 2010  . Vitamin D deficiency     PAST SURGICAL HISTORY: Past Surgical History:  Procedure Laterality Date  . ABDOMINAL HYSTERECTOMY     s/p endometrial adenocarcinoma  . LAPAROSCOPIC GASTRIC BANDING  04/17/2009  . TOTAL KNEE ARTHROPLASTY Left 08/25/2016   Procedure: TOTAL KNEE ARTHROPLASTY;  Surgeon: Elsie Saas, MD;  Location: [facility];  Service: Orthopedics;  Laterality: Left;    SOCIAL HISTORY: Social History   Tobacco Use  . Smoking status: Never Smoker  . Smokeless tobacco: Never Used  Substance Use Topics  . Alcohol use: No  . Drug use: No    FAMILY HISTORY: Family History  Problem Relation Age of Onset  . Hypertension Mother   . Kidney disease Mother   . Hypertension Sister   . Hypertension Maternal Grandmother     ROS: Review of Systems  Cardiovascular: Negative for chest pain.  Neurological: Negative for headaches.       Negative for lightheadedness.    PHYSICAL EXAM: Pt in no acute  distress  RECENT LABS AND TESTS: BMET    Component Value Date/Time   NA 142 08/10/2018 1249   K 4.5 08/10/2018 1249   CL 101 08/10/2018 1249   CO2 26 08/10/2018 1249   GLUCOSE 76 08/10/2018 1249   GLUCOSE 125 (H) 08/27/2016 0505   BUN 16 08/10/2018 1249   CREATININE 0.93 08/10/2018 1249   CALCIUM 10.0 08/10/2018 1249   GFRNONAA 70 08/10/2018 1249   GFRAA 81 08/10/2018 1249   Lab Results  Component Value Date   HGBA1C 5.6 08/10/2018   HGBA1C 5.6 05/18/2018   HGBA1C 5.7 (H) 12/28/2017   HGBA1C 5.7 (H) 08/15/2016   Lab Results  Component Value Date   INSULIN 7.7 08/10/2018   INSULIN 14.4 05/18/2018   INSULIN 14.0 12/28/2017   CBC    Component Value Date/Time   WBC 5.6 12/28/2017 1006   WBC 14.6 (H) 08/27/2016 0505  RBC 4.91 12/28/2017 1006   RBC 4.04 08/27/2016 0505   HGB 12.7 12/28/2017 1006   HCT 39.2 12/28/2017 1006   PLT 247 08/27/2016 0505   MCV 80 12/28/2017 1006   MCH 25.9 (L) 12/28/2017 1006   MCH 26.2 08/27/2016 0505   MCHC 32.4 12/28/2017 1006   MCHC 32.2 08/27/2016 0505   RDW 15.9 (H) 12/28/2017 1006   LYMPHSABS 0.9 12/28/2017 1006   EOSABS 0.2 12/28/2017 1006   BASOSABS 0.0 12/28/2017 1006   Iron/TIBC/Ferritin/ %Sat No results found for: IRON, TIBC, FERRITIN, IRONPCTSAT Lipid Panel     Component Value Date/Time   CHOL 216 (H) 08/10/2018 1249   TRIG 120 08/10/2018 1249   HDL 61 08/10/2018 1249   LDLCALC 131 (H) 08/10/2018 1249   Hepatic Function Panel     Component Value Date/Time   PROT 6.4 08/10/2018 1249   ALBUMIN 4.0 08/10/2018 1249   AST 20 08/10/2018 1249   ALT 17 08/10/2018 1249   ALKPHOS 86 08/10/2018 1249   BILITOT 0.4 08/10/2018 1249      Component Value Date/Time   TSH 3.130 12/28/2017 1006   Results for KEWANNA, KASPRZAK (MRN [id number]) as of 01/24/2019 14:20  Ref. Range 08/10/2018 12:49  Vitamin D, 25-Hydroxy Latest Ref Range: 30.0 - 100.0 ng/mL 22.5 (L)     I, Marcille Blanco, CMA, am acting as transcriptionist for  Starlyn Skeans, MD I have reviewed the above documentation for accuracy and completeness, and I agree with the above. -Dennard Nip, MD

## 2019-01-26 ENCOUNTER — Other Ambulatory Visit (INDEPENDENT_AMBULATORY_CARE_PROVIDER_SITE_OTHER): Payer: Self-pay | Admitting: Family Medicine

## 2019-01-26 DIAGNOSIS — R7303 Prediabetes: Secondary | ICD-10-CM

## 2019-01-31 ENCOUNTER — Other Ambulatory Visit (INDEPENDENT_AMBULATORY_CARE_PROVIDER_SITE_OTHER): Payer: Self-pay | Admitting: Family Medicine

## 2019-01-31 DIAGNOSIS — E559 Vitamin D deficiency, unspecified: Secondary | ICD-10-CM

## 2019-02-02 ENCOUNTER — Other Ambulatory Visit (INDEPENDENT_AMBULATORY_CARE_PROVIDER_SITE_OTHER): Payer: Self-pay | Admitting: Family Medicine

## 2019-02-02 DIAGNOSIS — F3289 Other specified depressive episodes: Secondary | ICD-10-CM

## 2019-02-14 ENCOUNTER — Ambulatory Visit (INDEPENDENT_AMBULATORY_CARE_PROVIDER_SITE_OTHER): Payer: BLUE CROSS/BLUE SHIELD | Admitting: Family Medicine

## 2019-02-14 ENCOUNTER — Other Ambulatory Visit: Payer: Self-pay

## 2019-02-14 ENCOUNTER — Encounter (INDEPENDENT_AMBULATORY_CARE_PROVIDER_SITE_OTHER): Payer: Self-pay | Admitting: Family Medicine

## 2019-02-14 DIAGNOSIS — R7303 Prediabetes: Secondary | ICD-10-CM | POA: Diagnosis not present

## 2019-02-14 DIAGNOSIS — F3289 Other specified depressive episodes: Secondary | ICD-10-CM | POA: Diagnosis not present

## 2019-02-14 DIAGNOSIS — E559 Vitamin D deficiency, unspecified: Secondary | ICD-10-CM

## 2019-02-14 DIAGNOSIS — Z6841 Body Mass Index (BMI) 40.0 and over, adult: Secondary | ICD-10-CM

## 2019-02-14 MED ORDER — METFORMIN HCL 500 MG PO TABS: 500.0000 mg | ORAL_TABLET | Freq: Every day | ORAL | 0 refills | Status: DC

## 2019-02-14 MED ORDER — VITAMIN D (ERGOCALCIFEROL) 1.25 MG (50000 UNIT) PO CAPS: 50000.0000 [IU] | ORAL_CAPSULE | ORAL | 0 refills | Status: DC

## 2019-02-14 MED ORDER — BUPROPION HCL ER (SR) 200 MG PO TB12: 200.0000 mg | ORAL_TABLET | Freq: Every day | ORAL | 0 refills | Status: DC

## 2019-02-15 NOTE — Progress Notes (Signed)
Office: [phone]  /  Fax: [phone] TeleHealth Visit:  Clemon [name] has verbally consented to this TeleHealth visit today. The patient is located at home, the provider is located at the [facility] office. The participants in this visit include the listed provider and patient. The visit was conducted today via Webex.  HPI:   Chief Complaint: OBESITY [name] is here to discuss her progress with her obesity treatment plan. She is on the Category 2 plan and is following her eating plan approximately 50 % of the time. She states she is walking 5,500 steps per day 7 times per week. [name] is currently struggling to follow her Category 2 plan due to decreased meal prepping and planning. It appears that she has gained about 9 pounds in the last 8 weeks. She is counting her steps for exercise. She would like to discuss other eating plans.  We were unable to weigh the patient today for this TeleHealth visit. She feels as if she has gained weight since her last visit. She has lost 9 lbs since starting treatment with [name].  Vitamin D Deficiency [name] has a diagnosis of vitamin D deficiency. She is currently stable on vit D, but is not yet at goal. [name] notes decreased fatigue and denies nausea, vomiting, or muscle weakness.  Pre-Diabetes [name] has a diagnosis of pre-diabetes based on her elevated Hgb A1c and was informed this puts her at greater risk of developing diabetes. She has been struggling with increased simple carbs in the last 2 to 3 weeks. [name] is stable on metformin currently and continues to work on diet and exercise to decrease risk of diabetes. She denies nausea, vomiting, or hypoglycemia.   Depression with emotional eating behaviors [name]'s mood is stable on Wellbutrin. She feels more in control of her emotional eating and using food for comfort to the extent that it is negatively impacting her health. She has been working on behavior modification techniques to  help reduce her emotional eating and has been somewhat successful. She denies insomnia or palpitations.  ASSESSMENT AND PLAN:  Vitamin D deficiency - Plan: Vitamin D, Ergocalciferol, (DRISDOL) 1.25 MG (50000 UT) CAPS capsule  Prediabetes - Plan: metFORMIN (GLUCOPHAGE) 500 MG tablet  Other depression - with emotional eating - Plan: buPROPion (WELLBUTRIN SR) 200 MG 12 hr tablet  Class 3 severe obesity with serious comorbidity and body mass index (BMI) of 45.0 to 49.9 in adult, unspecified obesity type ([place])  PLAN:  Vitamin D Deficiency [name] was informed that low vitamin D levels contribute to fatigue and are associated with obesity, breast, and colon cancer. [name] agrees to continue to take prescription Vit D @50 ,000 IU every week #4 with no refills and will follow up for routine testing of vitamin D, at least 2-3 times per year. She was informed of the risk of over-replacement of vitamin D and agrees to not increase her dose unless she discusses this with [name] first. [name] agrees to follow up in 2 weeks as directed.  Pre-Diabetes [name] will continue to work on weight loss, exercise, and decreasing simple carbohydrates in her diet to help decrease the risk of diabetes. She was informed that eating too many simple carbohydrates or too many calories at one sitting increases the likelihood of GI side effects. [name] agreed to get back to her diet prescription and to continue metformin 500 mg qAM #30 with no refills and a prescription was written today. [name] agreed to follow up with [name] as directed to  monitor her progress in 2 weeks.   Depression with Emotional Eating Behaviors We discussed behavior modification techniques today to help [name] deal with her emotional eating and depression. She has agreed to take Wellbutrin SR 200 mg qd#30 with no refills and agreed to follow up as directed.  Obesity [name] is currently in the action stage of change. As such, her goal is to continue with weight  loss efforts. She has agreed to keep a food journal with 1000 to 1300 calories and 75+ grams of protein.  [name] has been instructed to work up to a goal of 150 minutes of combined cardio and strengthening exercise per week for weight loss and overall health benefits. We discussed the following Behavioral Modification Strategies today: work on meal planning and easy cooking plans, keep a strict food journal, and emotional eating strategies.  [name] has agreed to follow up with our clinic in 2 weeks. She was informed of the importance of frequent follow up visits to maximize her success with intensive lifestyle modifications for her multiple health conditions.  ALLERGIES: Allergies  Allergen Reactions  . No Known Allergies     MEDICATIONS: Current Outpatient Medications on File Prior to Visit  Medication Sig Dispense Refill  . acetaminophen (TYLENOL) 325 MG tablet Take 2 tablets (650 mg total) by mouth every 6 (six) hours as needed for mild pain (or Fever >/= 101).    [name] Kitchen amoxicillin (AMOXIL) 500 MG capsule Take 500 mg by mouth. Take 4 capsule by mouth prior to dental procedure    . aspirin EC 81 MG tablet Take 81 mg by mouth daily.    [name] Kitchen aspirin-acetaminophen-caffeine (EXCEDRIN MIGRAINE) 250-250-65 MG tablet Take by mouth every 6 (six) hours as needed for headache.    . Calcium 600-200 MG-UNIT tablet Take 1 tablet by mouth daily.    . cyclobenzaprine (FLEXERIL) 10 MG tablet Take 10 mg by mouth 3 (three) times daily as needed for muscle spasms.    . meloxicam (MOBIC) 15 MG tablet Take 15 mg by mouth daily.    . Multiple Vitamins-Minerals (MULTIVITAMIN WITH MINERALS) tablet Take 1 tablet by mouth daily.    . naproxen sodium (ALEVE) 220 MG tablet Take 220 mg by mouth daily as needed.    . triamterene-hydrochlorothiazide (MAXZIDE-25) 37.5-25 MG per tablet Take 1 tablet by mouth Daily.     No current facility-administered medications on file prior to visit.     PAST MEDICAL HISTORY: Past  Medical History:  Diagnosis Date  . Arthritis   . Back pain   . 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 Hypertension   . Joint pain   . Leg edema   . Obesity    S/P lap band 2010  . Vitamin D deficiency     PAST SURGICAL HISTORY: Past Surgical History:  Procedure Laterality Date  . ABDOMINAL HYSTERECTOMY     s/p endometrial adenocarcinoma  . LAPAROSCOPIC GASTRIC BANDING  04/17/2009  . TOTAL KNEE ARTHROPLASTY Left 08/25/2016   Procedure: TOTAL KNEE ARTHROPLASTY;  Surgeon: Elsie Saas, MD;  Location: [facility];  Service: Orthopedics;  Laterality: Left;    SOCIAL HISTORY: Social History   Tobacco Use  . Smoking status: Never Smoker  . Smokeless tobacco: Never Used  Substance Use Topics  . Alcohol use: No  . Drug use: No    FAMILY HISTORY: Family History  Problem Relation Age of Onset  . Hypertension Mother   . Kidney disease Mother   . Hypertension Sister   . Hypertension Maternal  Grandmother     ROS: Review of Systems  Constitutional: Positive for malaise/fatigue.  Cardiovascular: Negative for palpitations.  Gastrointestinal: Negative for nausea and vomiting.  Musculoskeletal:       Negative for muscle weakness.  Endo/Heme/Allergies:       Negative for hypoglycemia.  Psychiatric/Behavioral: Positive for depression. The patient does not have insomnia.     PHYSICAL EXAM: Pt in no acute distress  RECENT LABS AND TESTS: BMET    Component Value Date/Time   NA 142 08/10/2018 1249   K 4.5 08/10/2018 1249   CL 101 08/10/2018 1249   CO2 26 08/10/2018 1249   GLUCOSE 76 08/10/2018 1249   GLUCOSE 125 (H) 08/27/2016 0505   BUN 16 08/10/2018 1249   CREATININE 0.93 08/10/2018 1249   CALCIUM 10.0 08/10/2018 1249   GFRNONAA 70 08/10/2018 1249   GFRAA 81 08/10/2018 1249   Lab Results  Component Value Date   HGBA1C 5.6 08/10/2018   HGBA1C 5.6 05/18/2018   HGBA1C 5.7 (H) 12/28/2017   HGBA1C 5.7 (H) 08/15/2016   Lab Results  Component Value Date   INSULIN  7.7 08/10/2018   INSULIN 14.4 05/18/2018   INSULIN 14.0 12/28/2017   CBC    Component Value Date/Time   WBC 5.6 12/28/2017 1006   WBC 14.6 (H) 08/27/2016 0505   RBC 4.91 12/28/2017 1006   RBC 4.04 08/27/2016 0505   HGB 12.7 12/28/2017 1006   HCT 39.2 12/28/2017 1006   PLT 247 08/27/2016 0505   MCV 80 12/28/2017 1006   MCH 25.9 (L) 12/28/2017 1006   MCH 26.2 08/27/2016 0505   MCHC 32.4 12/28/2017 1006   MCHC 32.2 08/27/2016 0505   RDW 15.9 (H) 12/28/2017 1006   LYMPHSABS 0.9 12/28/2017 1006   EOSABS 0.2 12/28/2017 1006   BASOSABS 0.0 12/28/2017 1006   Iron/TIBC/Ferritin/ %Sat No results found for: IRON, TIBC, FERRITIN, IRONPCTSAT Lipid Panel     Component Value Date/Time   CHOL 216 (H) 08/10/2018 1249   TRIG 120 08/10/2018 1249   HDL 61 08/10/2018 1249   LDLCALC 131 (H) 08/10/2018 1249   Hepatic Function Panel     Component Value Date/Time   PROT 6.4 08/10/2018 1249   ALBUMIN 4.0 08/10/2018 1249   AST 20 08/10/2018 1249   ALT 17 08/10/2018 1249   ALKPHOS 86 08/10/2018 1249   BILITOT 0.4 08/10/2018 1249      Component Value Date/Time   TSH 3.130 12/28/2017 1006   Results for LYNEL, FORESTER (MRN [id number]) as of 02/15/2019 14:50  Ref. Range 08/10/2018 12:49  Vitamin D, 25-Hydroxy Latest Ref Range: 30.0 - 100.0 ng/mL 22.5 (L)     I, Marcille Blanco, CMA, am acting as transcriptionist for Starlyn Skeans, MD I have reviewed the above documentation for accuracy and completeness, and I agree with the above. -Dennard Nip, MD

## 2019-03-01 ENCOUNTER — Encounter (INDEPENDENT_AMBULATORY_CARE_PROVIDER_SITE_OTHER): Payer: Self-pay | Admitting: Family Medicine

## 2019-03-01 ENCOUNTER — Other Ambulatory Visit: Payer: Self-pay

## 2019-03-01 ENCOUNTER — Ambulatory Visit (INDEPENDENT_AMBULATORY_CARE_PROVIDER_SITE_OTHER): Payer: BC Managed Care – PPO | Admitting: Family Medicine

## 2019-03-01 DIAGNOSIS — E559 Vitamin D deficiency, unspecified: Secondary | ICD-10-CM | POA: Diagnosis not present

## 2019-03-01 DIAGNOSIS — R7303 Prediabetes: Secondary | ICD-10-CM | POA: Diagnosis not present

## 2019-03-01 DIAGNOSIS — F3289 Other specified depressive episodes: Secondary | ICD-10-CM

## 2019-03-01 DIAGNOSIS — Z6841 Body Mass Index (BMI) 40.0 and over, adult: Secondary | ICD-10-CM

## 2019-03-01 MED ORDER — BUPROPION HCL ER (SR) 200 MG PO TB12: 200.0000 mg | ORAL_TABLET | Freq: Every day | ORAL | 0 refills | Status: DC

## 2019-03-01 MED ORDER — VITAMIN D (ERGOCALCIFEROL) 1.25 MG (50000 UNIT) PO CAPS: 50000.0000 [IU] | ORAL_CAPSULE | ORAL | 0 refills | Status: DC

## 2019-03-01 MED ORDER — METFORMIN HCL 500 MG PO TABS: 500.0000 mg | ORAL_TABLET | Freq: Every day | ORAL | 0 refills | Status: DC

## 2019-03-01 NOTE — Progress Notes (Signed)
Office: [phone]  /  Fax: [phone] TeleHealth Visit:  [name] has verbally consented to this TeleHealth visit today. The patient is located at home, the provider is located at the [facility] office. The participants in this visit include the listed provider and patient. The visit was conducted today via webex.  HPI:   Chief Complaint: OBESITY Mckennah is here to discuss her progress with her obesity treatment plan. She is on the  follow the Category 2 plan and is following her eating plan approximately 50 % of the time. She states she is exercising by walking 5500 to 7000 steps 7 times per week. Payson is currently struggling to journal or follow her category 2 plan closely. She had gained approximately 8 lbs last visit and feels she has lost 2 of these lbs in the last 3 weeks. .  We were unable to weigh the patient today for this TeleHealth visit. She feels as if she has lost weight since her last visit. She has lost 9 lbs since starting treatment with [name].  Vitamin D deficiency Reilly has a diagnosis of vitamin D deficiency. She is currently taking vit D and denies nausea, vomiting or muscle weakness. Her vitamin D level is not yet at goal.   Pre-Diabetes Kiyana has a diagnosis of prediabetes based on her elevated HgA1c of 5.6 and was informed this puts her at greater risk of developing diabetes. She is taking metformin currently but stuggling to follow her eating plan closely but continues to work on diet and exercise to decrease risk of diabetes. She denies nausea or hypoglycemia.  Depression with emotional eating behaviors Ovida is struggling with emotional eating and using food for comfort to the extent that it is negatively impacting her health. She often snacks when she is not hungry. Billee sometimes feels she is out of control and then feels guilty that she made poor food choices. She has been working on behavior modification techniques to help reduce her  emotional eating and has been somewhat successful. She shows no sign of suicidal or homicidal ideations. She states her mood is stable, she denies insomnia or palpitations. She feels she is doing better with avoiding temptations and not using food for comfort as much.   Depression screen PHQ 2/9 12/28/2017  Decreased Interest 1  Down, Depressed, Hopeless 0  PHQ - 2 Score 1  Altered sleeping 0  Tired, decreased energy 1  Change in appetite 1  Feeling bad or failure about yourself  0  Trouble concentrating 0  Moving slowly or fidgety/restless 0  Suicidal thoughts 0  PHQ-9 Score 3  Difficult doing work/chores Not difficult at all     ASSESSMENT AND PLAN:  Vitamin D deficiency - Plan: Vitamin D, Ergocalciferol, (DRISDOL) 1.25 MG (50000 UT) CAPS capsule  Prediabetes - Plan: metFORMIN (GLUCOPHAGE) 500 MG tablet  Other depression - with emotional eating - Plan: buPROPion (WELLBUTRIN SR) 200 MG 12 hr tablet  Class 3 severe obesity with serious comorbidity and body mass index (BMI) of 45.0 to 49.9 in adult, unspecified obesity type (HCC)  PLAN: Vitamin D Deficiency Nishi was informed that low vitamin D levels contributes to fatigue and are associated with obesity, breast, and colon cancer. She agrees to continue to take prescription Vit D @50 ,000 IU every week #4 with no refills and will follow up for routine testing of vitamin D, at least 2-3 times per year. She was informed of the risk of over-replacement of vitamin D and  agrees to not increase her dose unless she discusses this with [name] first. Agrees to follow up with our clinic as directed. We will repeat labs at next in office visit  Pre-Diabetes Jakala will continue to work on weight loss, exercise, and decreasing simple carbohydrates in her diet to help decrease the risk of diabetes. We dicussed metformin including benefits and risks. She was informed that eating too many simple carbohydrates or too many calories at one sitting increases  the likelihood of GI side effects. Tiffany agrees to continue taking metformin 500 mg daily #30 with no refills written today. Moncerrath agreed to follow up with [name] as directed to monitor her progress. We will repeat labs in the next 1-2 months.   Depression with Emotional Eating Behaviors We discussed behavior modification techniques today to help Mariyanna deal with her emotional eating and depression. She has agreed to take Wellbutrin SR 200 mg qd #30 with no refills  and agreed to follow up as directed.  Obesity Dyamon is currently in the action stage of change. As such, her goal is to maintain weight for now She has agreed to follow the Category 2 plan Kanetra has been instructed to work up to a goal of 150 minutes of combined cardio and strengthening exercise per week for weight loss and overall health benefits. We discussed the following Behavioral Modification Stratagies today: work on meal planning and easy cooking plans, emotional eating strategies and ways to avoid boredom eating   Len has agreed to follow up with our clinic in 3 weeks. She was informed of the importance of frequent follow up visits to maximize her success with intensive lifestyle modifications for her multiple health conditions.  ALLERGIES: Allergies  Allergen Reactions  . No Known Allergies     MEDICATIONS: Current Outpatient Medications on File Prior to Visit  Medication Sig Dispense Refill  . acetaminophen (TYLENOL) 325 MG tablet Take 2 tablets (650 mg total) by mouth every 6 (six) hours as needed for mild pain (or Fever >/= 101).    [name] Kitchen amoxicillin (AMOXIL) 500 MG capsule Take 500 mg by mouth. Take 4 capsule by mouth prior to dental procedure    . aspirin EC 81 MG tablet Take 81 mg by mouth daily.    [name] Kitchen aspirin-acetaminophen-caffeine (EXCEDRIN MIGRAINE) 250-250-65 MG tablet Take by mouth every 6 (six) hours as needed for headache.    . Calcium 600-200 MG-UNIT tablet Take 1 tablet by mouth daily.    .  cyclobenzaprine (FLEXERIL) 10 MG tablet Take 10 mg by mouth 3 (three) times daily as needed for muscle spasms.    . meloxicam (MOBIC) 15 MG tablet Take 15 mg by mouth daily.    . Multiple Vitamins-Minerals (MULTIVITAMIN WITH MINERALS) tablet Take 1 tablet by mouth daily.    . naproxen sodium (ALEVE) 220 MG tablet Take 220 mg by mouth daily as needed.    . triamterene-hydrochlorothiazide (MAXZIDE-25) 37.5-25 MG per tablet Take 1 tablet by mouth Daily.     No current facility-administered medications on file prior to visit.     PAST MEDICAL HISTORY: Past Medical History:  Diagnosis Date  . Arthritis   . Back pain   . 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 Hypertension   . Joint pain   . Leg edema   . Obesity    S/P lap band 2010  . Vitamin D deficiency     PAST SURGICAL HISTORY: Past Surgical History:  Procedure Laterality Date  . ABDOMINAL HYSTERECTOMY  s/p endometrial adenocarcinoma  . LAPAROSCOPIC GASTRIC BANDING  04/17/2009  . TOTAL KNEE ARTHROPLASTY Left 08/25/2016   Procedure: TOTAL KNEE ARTHROPLASTY;  Surgeon: Elsie Saas, MD;  Location: [facility];  Service: Orthopedics;  Laterality: Left;    SOCIAL HISTORY: Social History   Tobacco Use  . Smoking status: Never Smoker  . Smokeless tobacco: Never Used  Substance Use Topics  . Alcohol use: No  . Drug use: No    FAMILY HISTORY: Family History  Problem Relation Age of Onset  . Hypertension Mother   . Kidney disease Mother   . Hypertension Sister   . Hypertension Maternal Grandmother     ROS: Review of Systems  Cardiovascular: Negative for palpitations.  Gastrointestinal: Negative for nausea and vomiting.  Musculoskeletal:       Negative for muscle weakness  Endo/Heme/Allergies:       Negative for hypoglycemia  Psychiatric/Behavioral: Positive for depression. Negative for suicidal ideas. The patient does not have insomnia.     PHYSICAL EXAM: Pt in no acute distress  RECENT LABS AND TESTS: BMET     Component Value Date/Time   NA 142 08/10/2018 1249   K 4.5 08/10/2018 1249   CL 101 08/10/2018 1249   CO2 26 08/10/2018 1249   GLUCOSE 76 08/10/2018 1249   GLUCOSE 125 (H) 08/27/2016 0505   BUN 16 08/10/2018 1249   CREATININE 0.93 08/10/2018 1249   CALCIUM 10.0 08/10/2018 1249   GFRNONAA 70 08/10/2018 1249   GFRAA 81 08/10/2018 1249   Lab Results  Component Value Date   HGBA1C 5.6 08/10/2018   HGBA1C 5.6 05/18/2018   HGBA1C 5.7 (H) 12/28/2017   HGBA1C 5.7 (H) 08/15/2016   Lab Results  Component Value Date   INSULIN 7.7 08/10/2018   INSULIN 14.4 05/18/2018   INSULIN 14.0 12/28/2017   CBC    Component Value Date/Time   WBC 5.6 12/28/2017 1006   WBC 14.6 (H) 08/27/2016 0505   RBC 4.91 12/28/2017 1006   RBC 4.04 08/27/2016 0505   HGB 12.7 12/28/2017 1006   HCT 39.2 12/28/2017 1006   PLT 247 08/27/2016 0505   MCV 80 12/28/2017 1006   MCH 25.9 (L) 12/28/2017 1006   MCH 26.2 08/27/2016 0505   MCHC 32.4 12/28/2017 1006   MCHC 32.2 08/27/2016 0505   RDW 15.9 (H) 12/28/2017 1006   LYMPHSABS 0.9 12/28/2017 1006   EOSABS 0.2 12/28/2017 1006   BASOSABS 0.0 12/28/2017 1006   Iron/TIBC/Ferritin/ %Sat No results found for: IRON, TIBC, FERRITIN, IRONPCTSAT Lipid Panel     Component Value Date/Time   CHOL 216 (H) 08/10/2018 1249   TRIG 120 08/10/2018 1249   HDL 61 08/10/2018 1249   LDLCALC 131 (H) 08/10/2018 1249   Hepatic Function Panel     Component Value Date/Time   PROT 6.4 08/10/2018 1249   ALBUMIN 4.0 08/10/2018 1249   AST 20 08/10/2018 1249   ALT 17 08/10/2018 1249   ALKPHOS 86 08/10/2018 1249   BILITOT 0.4 08/10/2018 1249      Component Value Date/Time   TSH 3.130 12/28/2017 1006     Ref. Range 08/10/2018 12:49  Vitamin D, 25-Hydroxy Latest Ref Range: 30.0 - 100.0 ng/mL 22.5 (L)     I, Renee Ramus, am acting as Location manager for Dennard Nip, MD  I have reviewed the above documentation for accuracy and completeness, and I agree with the  above. -Dennard Nip, MD

## 2019-03-08 ENCOUNTER — Other Ambulatory Visit (INDEPENDENT_AMBULATORY_CARE_PROVIDER_SITE_OTHER): Payer: Self-pay | Admitting: Family Medicine

## 2019-03-08 DIAGNOSIS — E559 Vitamin D deficiency, unspecified: Secondary | ICD-10-CM

## 2019-03-10 ENCOUNTER — Other Ambulatory Visit (INDEPENDENT_AMBULATORY_CARE_PROVIDER_SITE_OTHER): Payer: Self-pay | Admitting: Family Medicine

## 2019-03-10 DIAGNOSIS — R7303 Prediabetes: Secondary | ICD-10-CM

## 2019-03-23 ENCOUNTER — Encounter (INDEPENDENT_AMBULATORY_CARE_PROVIDER_SITE_OTHER): Payer: Self-pay | Admitting: Family Medicine

## 2019-03-23 ENCOUNTER — Other Ambulatory Visit: Payer: Self-pay

## 2019-03-23 ENCOUNTER — Telehealth (INDEPENDENT_AMBULATORY_CARE_PROVIDER_SITE_OTHER): Payer: BC Managed Care – PPO | Admitting: Family Medicine

## 2019-03-23 DIAGNOSIS — R7303 Prediabetes: Secondary | ICD-10-CM | POA: Diagnosis not present

## 2019-03-23 DIAGNOSIS — Z6841 Body Mass Index (BMI) 40.0 and over, adult: Secondary | ICD-10-CM | POA: Diagnosis not present

## 2019-03-23 NOTE — Progress Notes (Signed)
Office: [phone]  /  Fax: [phone] TeleHealth Visit:  Clemon [name] has verbally consented to this TeleHealth visit today. The patient is located at home, the provider is located at the [facility] office. The participants in this visit include the listed provider and patient. The visit was conducted today via [email or web address].  HPI:   Chief Complaint: OBESITY [name] is here to discuss her progress with her obesity treatment plan. She is on the  follow the Category 2 planand is following her eating plan approximately 50 % of the time. She states she is exercising by walking daily.  [name] feels she had done well maintaining her weight since last visit. She has been walking most days approximately 45 minutes.   We were unable to weigh the patient today for this TeleHealth visit. She feels as if she has maintained weight since her last visit. She has lost 9 lbs since starting treatment with [name].  Pre-Diabetes [name] has a diagnosis of prediabetes based on her elevated HgA1c and was informed this puts her at greater risk of developing diabetes. She is taking metformin currently and continues to work on diet and exercise to decrease risk of diabetes. She denies nausea or hypoglycemia. She has recently increased exercise as she has been more sedentary while working from home.    ASSESSMENT AND PLAN:  Prediabetes  Class 3 severe obesity with serious comorbidity and body mass index (BMI) of 45.0 to 49.9 in adult, unspecified obesity type ([place])  PLAN: Pre-Diabetes [name] will continue to work on weight loss, exercise, and decreasing simple carbohydrates in her diet to help decrease the risk of diabetes. We dicussed metformin including benefits and risks. She was informed that eating too many simple carbohydrates or too many calories at one sitting increases the likelihood of GI side effects. [name] agreed to follow up with [name] as directed to monitor her progress. We will repeat labs  in 1 month.   I spent > than 50% of the 15 minute visit on counseling as documented in the note.  Obesity [name] is currently in the action stage of change. As such, her goal is to continue with weight loss efforts She has agreed to keep a food journal with 400-550 calories and 35+g of protein at supper and follow the Category 2 plan [name] has been instructed to work up to a goal of 150 minutes of combined cardio and strengthening exercise per week for weight loss and overall health benefits. We discussed the following Behavioral Modification Stratagies today: increasing lean protein intake, keeping healthy foods in the home, and keeping a strict food journal.    [name] has agreed to follow up with our clinic in 3 weeks. She was informed of the importance of frequent follow up visits to maximize her success with intensive lifestyle modifications for her multiple health conditions.  ALLERGIES: Allergies  Allergen Reactions   No Known Allergies     MEDICATIONS: Current Outpatient Medications on File Prior to Visit  Medication Sig Dispense Refill   acetaminophen (TYLENOL) 325 MG tablet Take 2 tablets (650 mg total) by mouth every 6 (six) hours as needed for mild pain (or Fever >/= 101).     amoxicillin (AMOXIL) 500 MG capsule Take 500 mg by mouth. Take 4 capsule by mouth prior to dental procedure     aspirin EC 81 MG tablet Take 81 mg by mouth daily.     aspirin-acetaminophen-caffeine (EXCEDRIN MIGRAINE) 250-250-65 MG tablet Take by mouth every 6 (six)  hours as needed for headache.     buPROPion (WELLBUTRIN SR) 200 MG 12 hr tablet Take 1 tablet (200 mg total) by mouth daily. 30 tablet 0   Calcium 600-200 MG-UNIT tablet Take 1 tablet by mouth daily.     cyclobenzaprine (FLEXERIL) 10 MG tablet Take 10 mg by mouth 3 (three) times daily as needed for muscle spasms.     meloxicam (MOBIC) 15 MG tablet Take 15 mg by mouth daily.     metFORMIN (GLUCOPHAGE) 500 MG tablet Take 1 tablet  (500 mg total) by mouth daily with breakfast. 30 tablet 0   Multiple Vitamins-Minerals (MULTIVITAMIN WITH MINERALS) tablet Take 1 tablet by mouth daily.     naproxen sodium (ALEVE) 220 MG tablet Take 220 mg by mouth daily as needed.     triamterene-hydrochlorothiazide (MAXZIDE-25) 37.5-25 MG per tablet Take 1 tablet by mouth Daily.     Vitamin D, Ergocalciferol, (DRISDOL) 1.25 MG (50000 UT) CAPS capsule Take 1 capsule (50,000 Units total) by mouth every 7 (seven) days. 4 capsule 0   No current facility-administered medications on file prior to visit.     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   Hypertension    Joint pain    Leg edema    Obesity    S/P lap band 2010   Vitamin D deficiency     PAST SURGICAL HISTORY: Past Surgical History:  Procedure Laterality Date   ABDOMINAL HYSTERECTOMY     s/p endometrial adenocarcinoma   LAPAROSCOPIC GASTRIC BANDING  04/17/2009   TOTAL KNEE ARTHROPLASTY Left 08/25/2016   Procedure: TOTAL KNEE ARTHROPLASTY;  Surgeon: Elsie Saas, MD;  Location: [facility];  Service: Orthopedics;  Laterality: Left;    SOCIAL HISTORY: Social History   Tobacco Use   Smoking status: Never Smoker   Smokeless tobacco: Never Used  Substance Use Topics   Alcohol use: No   Drug use: No    FAMILY HISTORY: Family History  Problem Relation Age of Onset   Hypertension Mother    Kidney disease Mother    Hypertension Sister    Hypertension Maternal Grandmother     ROS: Review of Systems  Endo/Heme/Allergies:       Negative for hypoglycemia    PHYSICAL EXAM: Pt in no acute distress  RECENT LABS AND TESTS: BMET    Component Value Date/Time   NA 142 08/10/2018 1249   K 4.5 08/10/2018 1249   CL 101 08/10/2018 1249   CO2 26 08/10/2018 1249   GLUCOSE 76 08/10/2018 1249   GLUCOSE 125 (H) 08/27/2016 0505   BUN 16 08/10/2018 1249   CREATININE  0.93 08/10/2018 1249   CALCIUM 10.0 08/10/2018 1249   GFRNONAA 70 08/10/2018 1249   GFRAA 81 08/10/2018 1249   Lab Results  Component Value Date   HGBA1C 5.6 08/10/2018   HGBA1C 5.6 05/18/2018   HGBA1C 5.7 (H) 12/28/2017   HGBA1C 5.7 (H) 08/15/2016   Lab Results  Component Value Date   INSULIN 7.7 08/10/2018   INSULIN 14.4 05/18/2018   INSULIN 14.0 12/28/2017   CBC    Component Value Date/Time   WBC 5.6 12/28/2017 1006   WBC 14.6 (H) 08/27/2016 0505   RBC 4.91 12/28/2017 1006   RBC 4.04 08/27/2016 0505   HGB 12.7 12/28/2017 1006   HCT 39.2 12/28/2017 1006   PLT 247 08/27/2016 0505   MCV 80 12/28/2017 1006  MCH 25.9 (L) 12/28/2017 1006   MCH 26.2 08/27/2016 0505   MCHC 32.4 12/28/2017 1006   MCHC 32.2 08/27/2016 0505   RDW 15.9 (H) 12/28/2017 1006   LYMPHSABS 0.9 12/28/2017 1006   EOSABS 0.2 12/28/2017 1006   BASOSABS 0.0 12/28/2017 1006   Iron/TIBC/Ferritin/ %Sat No results found for: IRON, TIBC, FERRITIN, IRONPCTSAT Lipid Panel     Component Value Date/Time   CHOL 216 (H) 08/10/2018 1249   TRIG 120 08/10/2018 1249   HDL 61 08/10/2018 1249   LDLCALC 131 (H) 08/10/2018 1249   Hepatic Function Panel     Component Value Date/Time   PROT 6.4 08/10/2018 1249   ALBUMIN 4.0 08/10/2018 1249   AST 20 08/10/2018 1249   ALT 17 08/10/2018 1249   ALKPHOS 86 08/10/2018 1249   BILITOT 0.4 08/10/2018 1249      Component Value Date/Time   TSH 3.130 12/28/2017 1006      I, Renee Ramus, am acting as Location manager for [employer] I have reviewed the above documentation for accuracy and completeness, and I agree with the above. -Dennard Nip, MD

## 2019-03-28 ENCOUNTER — Ambulatory Visit (INDEPENDENT_AMBULATORY_CARE_PROVIDER_SITE_OTHER): Payer: BLUE CROSS/BLUE SHIELD | Admitting: Family Medicine

## 2019-04-06 ENCOUNTER — Other Ambulatory Visit (INDEPENDENT_AMBULATORY_CARE_PROVIDER_SITE_OTHER): Payer: Self-pay | Admitting: Family Medicine

## 2019-04-06 DIAGNOSIS — E559 Vitamin D deficiency, unspecified: Secondary | ICD-10-CM

## 2019-04-08 ENCOUNTER — Other Ambulatory Visit (INDEPENDENT_AMBULATORY_CARE_PROVIDER_SITE_OTHER): Payer: Self-pay | Admitting: Family Medicine

## 2019-04-08 DIAGNOSIS — F3289 Other specified depressive episodes: Secondary | ICD-10-CM

## 2019-04-08 DIAGNOSIS — R7303 Prediabetes: Secondary | ICD-10-CM

## 2019-04-11 ENCOUNTER — Other Ambulatory Visit: Payer: Self-pay

## 2019-04-11 ENCOUNTER — Encounter (INDEPENDENT_AMBULATORY_CARE_PROVIDER_SITE_OTHER): Payer: Self-pay | Admitting: Family Medicine

## 2019-04-11 ENCOUNTER — Ambulatory Visit (INDEPENDENT_AMBULATORY_CARE_PROVIDER_SITE_OTHER): Payer: BC Managed Care – PPO | Admitting: Family Medicine

## 2019-04-11 VITALS — BP 115/75 | HR 60 | Temp 97.8°F | Ht 61.0 in | Wt 259.0 lb

## 2019-04-11 DIAGNOSIS — Z6841 Body Mass Index (BMI) 40.0 and over, adult: Secondary | ICD-10-CM

## 2019-04-11 DIAGNOSIS — F3289 Other specified depressive episodes: Secondary | ICD-10-CM | POA: Diagnosis not present

## 2019-04-11 DIAGNOSIS — Z9189 Other specified personal risk factors, not elsewhere classified: Secondary | ICD-10-CM

## 2019-04-11 DIAGNOSIS — E559 Vitamin D deficiency, unspecified: Secondary | ICD-10-CM | POA: Diagnosis not present

## 2019-04-11 MED ORDER — VITAMIN D (ERGOCALCIFEROL) 1.25 MG (50000 UNIT) PO CAPS: 50000.0000 [IU] | ORAL_CAPSULE | ORAL | 0 refills | Status: DC

## 2019-04-11 MED ORDER — TOPIRAMATE 50 MG PO TABS: 50.0000 mg | ORAL_TABLET | Freq: Every day | ORAL | 0 refills | Status: DC

## 2019-04-11 MED ORDER — BUPROPION HCL ER (SR) 200 MG PO TB12: 200.0000 mg | ORAL_TABLET | Freq: Every day | ORAL | 0 refills | Status: DC

## 2019-04-12 LAB — LIPID PANEL WITH LDL/HDL RATIO
Cholesterol, Total: 214 mg/dL — ABNORMAL HIGH (ref 100–199)
HDL: 64 mg/dL (ref >39)
LDL Calculated: 130 mg/dL — ABNORMAL HIGH (ref 0–99)
LDl/HDL Ratio: 2 ratio (ref 0.0–3.2)
Triglycerides: 98 mg/dL (ref 0–149)
VLDL Cholesterol Cal: 20 mg/dL (ref 5–40)

## 2019-04-12 LAB — COMPREHENSIVE METABOLIC PANEL
ALT: 14 IU/L (ref 0–32)
AST: 21 IU/L (ref 0–40)
Albumin/Globulin Ratio: 1.6 (ref 1.2–2.2)
Albumin: 4.2 g/dL (ref 3.8–4.9)
Alkaline Phosphatase: 82 IU/L (ref 39–117)
BUN/Creatinine Ratio: 21 (ref 9–23)
BUN: 18 mg/dL (ref 6–24)
Bilirubin Total: 0.5 mg/dL (ref 0.0–1.2)
CO2: 21 mmol/L (ref 20–29)
Calcium: 10.1 mg/dL (ref 8.7–10.2)
Chloride: 105 mmol/L (ref 96–106)
Creatinine, Ser: 0.87 mg/dL (ref 0.57–1.00)
GFR calc Af Amer: 88 mL/min/{1.73_m2} (ref >59)
GFR calc non Af Amer: 76 mL/min/{1.73_m2} (ref >59)
Globulin, Total: 2.6 g/dL (ref 1.5–4.5)
Glucose: 81 mg/dL (ref 65–99)
Potassium: 4.5 mmol/L (ref 3.5–5.2)
Sodium: 144 mmol/L (ref 134–144)
Total Protein: 6.8 g/dL (ref 6.0–8.5)

## 2019-04-12 LAB — T4, FREE: Free T4: 1.12 ng/dL (ref 0.82–1.77)

## 2019-04-12 LAB — INSULIN, RANDOM: INSULIN: 10.4 u[IU]/mL (ref 2.6–24.9)

## 2019-04-12 LAB — VITAMIN D 25 HYDROXY (VIT D DEFICIENCY, FRACTURES): Vit D, 25-Hydroxy: 36.9 ng/mL (ref 30.0–100.0)

## 2019-04-12 LAB — TSH: TSH: 2.41 u[IU]/mL (ref 0.450–4.500)

## 2019-04-12 LAB — HEMOGLOBIN A1C
Est. average glucose Bld gHb Est-mCnc: 117 mg/dL
Hgb A1c MFr Bld: 5.7 % — ABNORMAL HIGH (ref 4.8–5.6)

## 2019-04-12 LAB — T3: T3, Total: 116 ng/dL (ref 71–180)

## 2019-04-12 NOTE — Progress Notes (Signed)
Office: [phone]  /  Fax: [phone]   HPI:   Chief Complaint: OBESITY [name] is here to discuss her progress with her obesity treatment plan. She is on the keep a food journal with 400-550 calories and 35+ grams of protein at supper daily and follow the Category 2 plan and is following her eating plan approximately 50 % of the time. She states she is walking 5,500 steps 6 times per week. [name]'s last in office visit was 4 months ago, and she has gained 7 lbs in this time. She states she is ready to get back on track with her eating plan, and she thinks she would do best going back to the Category 2. She is struggling with cravings especially in the evening.  Her weight is 259 lb (117.5 kg) today and has gained 7 lbs since her last visit. She has lost 2 lbs since starting treatment with [name].  Vitamin D Deficiency [name] has a diagnosis of vitamin D deficiency. She is stable on prescription Vit D and denies nausea, vomiting or muscle weakness.  At risk for cardiovascular disease [name] is at a higher than average risk for cardiovascular disease due to obesity. She currently denies any chest pain.  Depression with Emotional Eating Behaviors [name] is on Wellbutrin, but she is still struggling with evening cravings, she is using food for comfort which leaves her feeling guilty and out of control. She struggle with emotional eating and often snacks when she is not hungry. She has been working on behavior modification techniques to help reduce her emotional eating and has been somewhat successful. She shows no sign of suicidal or homicidal ideations.  Depression screen PHQ 2/9 12/28/2017  Decreased Interest 1  Down, Depressed, Hopeless 0  PHQ - 2 Score 1  Altered sleeping 0  Tired, decreased energy 1  Change in appetite 1  Feeling bad or failure about yourself  0  Trouble concentrating 0  Moving slowly or fidgety/restless 0  Suicidal thoughts 0  PHQ-9 Score 3  Difficult doing  work/chores Not difficult at all    ASSESSMENT AND PLAN:  Vitamin D deficiency - Plan: Comprehensive metabolic panel, Hemoglobin A1c, Insulin, random, VITAMIN D 25 Hydroxy (Vit-D Deficiency, Fractures), Lipid Panel With LDL/HDL Ratio, T3, T4, free, TSH, Vitamin D, Ergocalciferol, (DRISDOL) 1.25 MG (50000 UT) CAPS capsule  Other depression - with emotional eating - Plan: buPROPion (WELLBUTRIN SR) 200 MG 12 hr tablet, topiramate (TOPAMAX) 50 MG tablet  At risk for heart disease  Class 3 severe obesity with serious comorbidity and body mass index (BMI) of 45.0 to 49.9 in adult, unspecified obesity type (HCC)  PLAN:  Vitamin D Deficiency [name] was informed that low vitamin D levels contributes to fatigue and are associated with obesity, breast, and colon cancer. [name] agrees to continue taking prescription Vit D 50,000 IU every week #4 and we will refill for 1 month. She will follow up for routine testing of vitamin D, at least 2-3 times per year. She was informed of the risk of over-replacement of vitamin D and agrees to not increase her dose unless she discusses this with [name] first. [name] agrees to follow up with our clinic in 2 to 3 weeks.  Cardiovascular risk counseling [name] was given extended (15 minutes) coronary artery disease prevention counseling today. She is 53 y.o. female and has risk factors for heart disease including obesity. We discussed intensive lifestyle modifications today with an emphasis on specific weight loss instructions and strategies. Pt was also  informed of the importance of increasing exercise and decreasing saturated fats to help prevent heart disease.  Depression with Emotional Eating Behaviors We discussed behavior modification techniques today to help [name] deal with her emotional eating and depression. [name] agrees to start topiramate 50 mg qhs #30 with no refills; and she agrees to continue taking Wellbutrin SR 200 mg q daily #30 and we will refill for 1  month. [name] agrees to follow up with our clinic in 2 to 3 weeks.  Obesity [name] is currently in the action stage of change. As such, her goal is to continue with weight loss efforts She has agreed to follow the Category 2 plan [name] has been instructed to work up to a goal of 150 minutes of combined cardio and strengthening exercise per week for weight loss and overall health benefits. We discussed the following Behavioral Modification Strategies today: work on meal planning and easy cooking plans and planning for success   [name] has agreed to follow up with our clinic in 2 to 3 weeks. She was informed of the importance of frequent follow up visits to maximize her success with intensive lifestyle modifications for her multiple health conditions.  ALLERGIES: Allergies  Allergen Reactions  . No Known Allergies     MEDICATIONS: Current Outpatient Medications on File Prior to Visit  Medication Sig Dispense Refill  . acetaminophen (TYLENOL) 325 MG tablet Take 2 tablets (650 mg total) by mouth every 6 (six) hours as needed for mild pain (or Fever >/= 101).    [name] Kitchen amoxicillin (AMOXIL) 500 MG capsule Take 500 mg by mouth. Take 4 capsule by mouth prior to dental procedure    . aspirin EC 81 MG tablet Take 81 mg by mouth daily.    [name] Kitchen aspirin-acetaminophen-caffeine (EXCEDRIN MIGRAINE) 250-250-65 MG tablet Take by mouth every 6 (six) hours as needed for headache.    . Calcium 600-200 MG-UNIT tablet Take 1 tablet by mouth daily.    . cyclobenzaprine (FLEXERIL) 10 MG tablet Take 10 mg by mouth 3 (three) times daily as needed for muscle spasms.    . meloxicam (MOBIC) 15 MG tablet Take 15 mg by mouth daily.    . metFORMIN (GLUCOPHAGE) 500 MG tablet Take 1 tablet (500 mg total) by mouth daily with breakfast. 30 tablet 0  . Multiple Vitamins-Minerals (MULTIVITAMIN WITH MINERALS) tablet Take 1 tablet by mouth daily.    . naproxen sodium (ALEVE) 220 MG tablet Take 220 mg by mouth daily as needed.    .  triamterene-hydrochlorothiazide (MAXZIDE-25) 37.5-25 MG per tablet Take 1 tablet by mouth Daily.     No current facility-administered medications on file prior to visit.     PAST MEDICAL HISTORY: Past Medical History:  Diagnosis Date  . Arthritis   . Back pain   . 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 Hypertension   . Joint pain   . Leg edema   . Obesity    S/P lap band 2010  . Vitamin D deficiency     PAST SURGICAL HISTORY: Past Surgical History:  Procedure Laterality Date  . ABDOMINAL HYSTERECTOMY     s/p endometrial adenocarcinoma  . LAPAROSCOPIC GASTRIC BANDING  04/17/2009  . TOTAL KNEE ARTHROPLASTY Left 08/25/2016   Procedure: TOTAL KNEE ARTHROPLASTY;  Surgeon: Elsie Saas, MD;  Location: [facility];  Service: Orthopedics;  Laterality: Left;    SOCIAL HISTORY: Social History   Tobacco Use  . Smoking status: Never Smoker  . Smokeless tobacco: Never Used  Substance Use Topics  .  Alcohol use: No  . Drug use: No    FAMILY HISTORY: Family History  Problem Relation Age of Onset  . Hypertension Mother   . Kidney disease Mother   . Hypertension Sister   . Hypertension Maternal Grandmother     ROS: Review of Systems  Constitutional: Negative for weight loss.  Cardiovascular: Negative for chest pain.  Gastrointestinal: Negative for nausea and vomiting.  Musculoskeletal:       Negative muscle weakness  Psychiatric/Behavioral: Positive for depression. Negative for suicidal ideas.    PHYSICAL EXAM: Blood pressure 115/75, pulse 60, temperature 97.8 F (36.6 C), temperature source Oral, height 5\' 1"  (1.549 m), weight 259 lb (117.5 kg), SpO2 100 %. Body mass index is 48.94 kg/m. Physical Exam Vitals signs reviewed.  Constitutional:      Appearance: Normal appearance. She is obese.  Cardiovascular:     Rate and Rhythm: Normal rate.     Pulses: Normal pulses.  Pulmonary:     Effort: Pulmonary effort is normal.     Breath sounds: Normal breath sounds.   Musculoskeletal: Normal range of motion.  Skin:    General: Skin is warm and dry.  Neurological:     Mental Status: She is alert and oriented to person, place, and time.  Psychiatric:        Mood and Affect: Mood normal.        Behavior: Behavior normal.     RECENT LABS AND TESTS: BMET    Component Value Date/Time   NA 144 04/11/2019 1314   K 4.5 04/11/2019 1314   CL 105 04/11/2019 1314   CO2 21 04/11/2019 1314   GLUCOSE 81 04/11/2019 1314   GLUCOSE 125 (H) 08/27/2016 0505   BUN 18 04/11/2019 1314   CREATININE 0.87 04/11/2019 1314   CALCIUM 10.1 04/11/2019 1314   GFRNONAA 76 04/11/2019 1314   GFRAA 88 04/11/2019 1314   Lab Results  Component Value Date   HGBA1C 5.7 (H) 04/11/2019   HGBA1C 5.6 08/10/2018   HGBA1C 5.6 05/18/2018   HGBA1C 5.7 (H) 12/28/2017   HGBA1C 5.7 (H) 08/15/2016   Lab Results  Component Value Date   INSULIN 10.4 04/11/2019   INSULIN 7.7 08/10/2018   INSULIN 14.4 05/18/2018   INSULIN 14.0 12/28/2017   CBC    Component Value Date/Time   WBC 5.6 12/28/2017 1006   WBC 14.6 (H) 08/27/2016 0505   RBC 4.91 12/28/2017 1006   RBC 4.04 08/27/2016 0505   HGB 12.7 12/28/2017 1006   HCT 39.2 12/28/2017 1006   PLT 247 08/27/2016 0505   MCV 80 12/28/2017 1006   MCH 25.9 (L) 12/28/2017 1006   MCH 26.2 08/27/2016 0505   MCHC 32.4 12/28/2017 1006   MCHC 32.2 08/27/2016 0505   RDW 15.9 (H) 12/28/2017 1006   LYMPHSABS 0.9 12/28/2017 1006   EOSABS 0.2 12/28/2017 1006   BASOSABS 0.0 12/28/2017 1006   Iron/TIBC/Ferritin/ %Sat No results found for: IRON, TIBC, FERRITIN, IRONPCTSAT Lipid Panel     Component Value Date/Time   CHOL 214 (H) 04/11/2019 1314   TRIG 98 04/11/2019 1314   HDL 64 04/11/2019 1314   LDLCALC 130 (H) 04/11/2019 1314   Hepatic Function Panel     Component Value Date/Time   PROT 6.8 04/11/2019 1314   ALBUMIN 4.2 04/11/2019 1314   AST 21 04/11/2019 1314   ALT 14 04/11/2019 1314   ALKPHOS 82 04/11/2019 1314   BILITOT 0.5  04/11/2019 1314      Component Value Date/Time  TSH 2.410 04/11/2019 1314   TSH 3.130 12/28/2017 1006      OBESITY BEHAVIORAL INTERVENTION VISIT  Today's visit was # 22   Starting weight: 261 lbs Starting date: 12/28/17 Today's weight : 259 lbs  Today's date: 04/11/2019 Total lbs lost to date: 2    ASK: We discussed the diagnosis of obesity with [name] today and [name] agreed to give [name] permission to discuss obesity behavioral modification therapy today.  ASSESS: Marine has the diagnosis of obesity and her BMI today is 48.96 [name] is in the action stage of change   ADVISE: [name] was educated on the multiple health risks of obesity as well as the benefit of weight loss to improve her health. She was advised of the need for long term treatment and the importance of lifestyle modifications to improve her current health and to decrease her risk of future health problems.  AGREE: Multiple dietary modification options and treatment options were discussed and  [name] agreed to follow the recommendations documented in the above note.  ARRANGE: [name] was educated on the importance of frequent visits to treat obesity as outlined per CMS and USPSTF guidelines and agreed to schedule her next follow up appointment today.  I, Trixie Dredge, am acting as transcriptionist for Dennard Nip, MD  I have reviewed the above documentation for accuracy and completeness, and I agree with the above. -Dennard Nip, MD

## 2019-04-18 ENCOUNTER — Ambulatory Visit (INDEPENDENT_AMBULATORY_CARE_PROVIDER_SITE_OTHER): Payer: BC Managed Care – PPO | Admitting: Family Medicine

## 2019-05-03 ENCOUNTER — Encounter (INDEPENDENT_AMBULATORY_CARE_PROVIDER_SITE_OTHER): Payer: Self-pay | Admitting: Family Medicine

## 2019-05-03 ENCOUNTER — Other Ambulatory Visit: Payer: Self-pay

## 2019-05-03 ENCOUNTER — Ambulatory Visit (INDEPENDENT_AMBULATORY_CARE_PROVIDER_SITE_OTHER): Payer: BC Managed Care – PPO | Admitting: Family Medicine

## 2019-05-03 VITALS — BP 107/73 | HR 68 | Temp 98.2°F | Ht 61.0 in | Wt 258.0 lb

## 2019-05-03 DIAGNOSIS — E559 Vitamin D deficiency, unspecified: Secondary | ICD-10-CM

## 2019-05-03 DIAGNOSIS — Z9189 Other specified personal risk factors, not elsewhere classified: Secondary | ICD-10-CM | POA: Diagnosis not present

## 2019-05-03 DIAGNOSIS — F3289 Other specified depressive episodes: Secondary | ICD-10-CM

## 2019-05-03 DIAGNOSIS — R7303 Prediabetes: Secondary | ICD-10-CM

## 2019-05-03 DIAGNOSIS — Z6841 Body Mass Index (BMI) 40.0 and over, adult: Secondary | ICD-10-CM

## 2019-05-03 MED ORDER — BUPROPION HCL ER (SR) 200 MG PO TB12: 200.0000 mg | ORAL_TABLET | Freq: Every day | ORAL | 0 refills | Status: DC

## 2019-05-03 MED ORDER — TOPIRAMATE 50 MG PO TABS: 50.0000 mg | ORAL_TABLET | Freq: Every day | ORAL | 0 refills | Status: DC

## 2019-05-03 MED ORDER — METFORMIN HCL 500 MG PO TABS: 500.0000 mg | ORAL_TABLET | Freq: Every day | ORAL | 0 refills | Status: DC

## 2019-05-03 MED ORDER — VITAMIN D (ERGOCALCIFEROL) 1.25 MG (50000 UNIT) PO CAPS: 50000.0000 [IU] | ORAL_CAPSULE | ORAL | 0 refills | Status: DC

## 2019-05-04 NOTE — Progress Notes (Signed)
Office: [phone]  /  Fax: [phone]   HPI:   Chief Complaint: OBESITY [name] is here to discuss her progress with her obesity treatment plan. She is on the Category 2 plan and is following her eating plan approximately 50 % of the time. She states she is walking 5,000 steps 4 times per week. [name] continues to lose weight slowly. She is working from home and struggles with grocery shopping and meal planning, but she feels she is doing better overall.  Her weight is 258 lb (117 kg) today and has had a weight loss of 1 pound over a period of 3 weeks since her last visit. She has lost 3 lbs since starting treatment with [name].  Vitamin D Deficiency [name] has a diagnosis of vitamin D deficiency. She is stable on prescription Vit D, level is not yet at goal but is improving. She denies nausea, vomiting or muscle weakness.  Pre-Diabetes [name] has a diagnosis of pre-diabetes based on her elevated Hgb A1c and was informed this puts her at greater risk of developing diabetes. Her last A1c was 5.7. She stable on metformin and is working on diet and exercise to decrease risk of diabetes. She denies nausea or vomiting.  At risk for cardiovascular disease [name] is at a higher than average risk for cardiovascular disease due to obesity. She currently denies any chest pain.  Depression with Emotional Eating Behaviors [name]'s mood is stable on her medications. She noted decreased emotional eating and less comfort eating. She denies insomnia or daytime somnolence. [name] struggles with emotional eating and using food for comfort to the extent that it is negatively impacting her health. She often snacks when she is not hungry. [name] sometimes feels she is out of control and then feels guilty that she made poor food choices. She has been working on behavior modification techniques to help reduce her emotional eating and has been somewhat successful. She shows no sign of suicidal or homicidal  ideations.  Depression screen PHQ 2/9 12/28/2017  Decreased Interest 1  Down, Depressed, Hopeless 0  PHQ - 2 Score 1  Altered sleeping 0  Tired, decreased energy 1  Change in appetite 1  Feeling bad or failure about yourself  0  Trouble concentrating 0  Moving slowly or fidgety/restless 0  Suicidal thoughts 0  PHQ-9 Score 3  Difficult doing work/chores Not difficult at all    ASSESSMENT AND PLAN:  Vitamin D deficiency - Plan: Vitamin D, Ergocalciferol, (DRISDOL) 1.25 MG (50000 UT) CAPS capsule  Prediabetes - Plan: metFORMIN (GLUCOPHAGE) 500 MG tablet  Other depression - with emotional eating - Plan: topiramate (TOPAMAX) 50 MG tablet, buPROPion (WELLBUTRIN SR) 200 MG 12 hr tablet  At risk for heart disease  Class 3 severe obesity with serious comorbidity and body mass index (BMI) of 45.0 to 49.9 in adult, unspecified obesity type (HCC)  PLAN:  Vitamin D Deficiency [name] was informed that low vitamin D levels contributes to fatigue and are associated with obesity, breast, and colon cancer. [name] agrees to continue taking prescription Vit D 50,000 IU every week #4 and we will refill for 1 month. She will follow up for routine testing of vitamin D, at least 2-3 times per year. She was informed of the risk of over-replacement of vitamin D and agrees to not increase her dose unless she discusses this with [name] first. [name] agrees to follow up with our clinic in 3 weeks.  Pre-Diabetes [name] will continue to work on weight loss, exercise,  and decreasing simple carbohydrates in her diet to help decrease the risk of diabetes. We dicussed metformin including benefits and risks. She was informed that eating too many simple carbohydrates or too many calories at one sitting increases the likelihood of GI side effects. [name] agrees to continue taking metformin 500 mg q AM #30 and we will refill for 1 month. [name] agrees to follow up with our clinic in 3 weeks as directed to monitor her  progress.  Cardiovascular risk counseling [name] was given extended (15 minutes) coronary artery disease prevention counseling today. She is 53 y.o. female and has risk factors for heart disease including obesity. We discussed intensive lifestyle modifications today with an emphasis on specific weight loss instructions and strategies. Pt was also informed of the importance of increasing exercise and decreasing saturated fats to help prevent heart disease.  Depression with Emotional Eating Behaviors We discussed behavior modification techniques today to help [name] deal with her emotional eating and depression. [name] agrees to continue taking topiramate 50 mg PO daily #30 and we will refill for 1 month, and she agrees to continue taking Wellbutrin SR 200 mg PO daily #30 and we will refill for 1 month. [name] agrees to follow up with our clinic in 3 weeks.  Obesity [name] is currently in the action stage of change. As such, her goal is to continue with weight loss efforts She has agreed to follow the Category 2 plan [name] has been instructed to work up to a goal of 150 minutes of combined cardio and strengthening exercise per week for weight loss and overall health benefits. We discussed the following Behavioral Modification Strategies today: work on meal planning and easy cooking plans, no skipping meals, and emotional eating strategies   [name] has agreed to follow up with our clinic in 3 weeks. She was informed of the importance of frequent follow up visits to maximize her success with intensive lifestyle modifications for her multiple health conditions.  ALLERGIES: Allergies  Allergen Reactions  . No Known Allergies     MEDICATIONS: Current Outpatient Medications on File Prior to Visit  Medication Sig Dispense Refill  . acetaminophen (TYLENOL) 325 MG tablet Take 2 tablets (650 mg total) by mouth every 6 (six) hours as needed for mild pain (or Fever >/= 101).    [name] Kitchen amoxicillin (AMOXIL)  500 MG capsule Take 500 mg by mouth. Take 4 capsule by mouth prior to dental procedure    . aspirin EC 81 MG tablet Take 81 mg by mouth daily.    [name] Kitchen aspirin-acetaminophen-caffeine (EXCEDRIN MIGRAINE) 250-250-65 MG tablet Take by mouth every 6 (six) hours as needed for headache.    . Calcium 600-200 MG-UNIT tablet Take 1 tablet by mouth daily.    . cyclobenzaprine (FLEXERIL) 10 MG tablet Take 10 mg by mouth 3 (three) times daily as needed for muscle spasms.    . meloxicam (MOBIC) 15 MG tablet Take 15 mg by mouth daily.    . Multiple Vitamins-Minerals (MULTIVITAMIN WITH MINERALS) tablet Take 1 tablet by mouth daily.    . naproxen sodium (ALEVE) 220 MG tablet Take 220 mg by mouth daily as needed.    . triamterene-hydrochlorothiazide (MAXZIDE-25) 37.5-25 MG per tablet Take 1 tablet by mouth Daily.     No current facility-administered medications on file prior to visit.     PAST MEDICAL HISTORY: Past Medical History:  Diagnosis Date  . Arthritis   . Back pain   . 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 Hypertension   .  Joint pain   . Leg edema   . Obesity    S/P lap band 2010  . Vitamin D deficiency     PAST SURGICAL HISTORY: Past Surgical History:  Procedure Laterality Date  . ABDOMINAL HYSTERECTOMY     s/p endometrial adenocarcinoma  . LAPAROSCOPIC GASTRIC BANDING  04/17/2009  . TOTAL KNEE ARTHROPLASTY Left 08/25/2016   Procedure: TOTAL KNEE ARTHROPLASTY;  Surgeon: Elsie Saas, MD;  Location: [facility];  Service: Orthopedics;  Laterality: Left;    SOCIAL HISTORY: Social History   Tobacco Use  . Smoking status: Never Smoker  . Smokeless tobacco: Never Used  Substance Use Topics  . Alcohol use: No  . Drug use: No    FAMILY HISTORY: Family History  Problem Relation Age of Onset  . Hypertension Mother   . Kidney disease Mother   . Hypertension Sister   . Hypertension Maternal Grandmother     ROS: Review of Systems  Constitutional: Positive for weight loss.   Cardiovascular: Negative for chest pain.  Gastrointestinal: Negative for nausea and vomiting.  Musculoskeletal:       Negative muscle weakness  Psychiatric/Behavioral: Positive for depression. Negative for suicidal ideas.    PHYSICAL EXAM: Blood pressure 107/73, pulse 68, temperature 98.2 F (36.8 C), temperature source Oral, height 5\' 1"  (1.549 m), weight 258 lb (117 kg), SpO2 99 %. Body mass index is 48.75 kg/m. Physical Exam Vitals signs reviewed.  Constitutional:      Appearance: Normal appearance. She is obese.  Cardiovascular:     Rate and Rhythm: Normal rate.     Pulses: Normal pulses.  Pulmonary:     Effort: Pulmonary effort is normal.     Breath sounds: Normal breath sounds.  Musculoskeletal: Normal range of motion.  Skin:    General: Skin is warm and dry.  Neurological:     Mental Status: She is alert and oriented to person, place, and time.  Psychiatric:        Mood and Affect: Mood normal.        Behavior: Behavior normal.     RECENT LABS AND TESTS: BMET    Component Value Date/Time   NA 144 04/11/2019 1314   K 4.5 04/11/2019 1314   CL 105 04/11/2019 1314   CO2 21 04/11/2019 1314   GLUCOSE 81 04/11/2019 1314   GLUCOSE 125 (H) 08/27/2016 0505   BUN 18 04/11/2019 1314   CREATININE 0.87 04/11/2019 1314   CALCIUM 10.1 04/11/2019 1314   GFRNONAA 76 04/11/2019 1314   GFRAA 88 04/11/2019 1314   Lab Results  Component Value Date   HGBA1C 5.7 (H) 04/11/2019   HGBA1C 5.6 08/10/2018   HGBA1C 5.6 05/18/2018   HGBA1C 5.7 (H) 12/28/2017   HGBA1C 5.7 (H) 08/15/2016   Lab Results  Component Value Date   INSULIN 10.4 04/11/2019   INSULIN 7.7 08/10/2018   INSULIN 14.4 05/18/2018   INSULIN 14.0 12/28/2017   CBC    Component Value Date/Time   WBC 5.6 12/28/2017 1006   WBC 14.6 (H) 08/27/2016 0505   RBC 4.91 12/28/2017 1006   RBC 4.04 08/27/2016 0505   HGB 12.7 12/28/2017 1006   HCT 39.2 12/28/2017 1006   PLT 247 08/27/2016 0505   MCV 80 12/28/2017  1006   MCH 25.9 (L) 12/28/2017 1006   MCH 26.2 08/27/2016 0505   MCHC 32.4 12/28/2017 1006   MCHC 32.2 08/27/2016 0505   RDW 15.9 (H) 12/28/2017 1006   LYMPHSABS 0.9 12/28/2017 1006   EOSABS  0.2 12/28/2017 1006   BASOSABS 0.0 12/28/2017 1006   Iron/TIBC/Ferritin/ %Sat No results found for: IRON, TIBC, FERRITIN, IRONPCTSAT Lipid Panel     Component Value Date/Time   CHOL 214 (H) 04/11/2019 1314   TRIG 98 04/11/2019 1314   HDL 64 04/11/2019 1314   LDLCALC 130 (H) 04/11/2019 1314   Hepatic Function Panel     Component Value Date/Time   PROT 6.8 04/11/2019 1314   ALBUMIN 4.2 04/11/2019 1314   AST 21 04/11/2019 1314   ALT 14 04/11/2019 1314   ALKPHOS 82 04/11/2019 1314   BILITOT 0.5 04/11/2019 1314      Component Value Date/Time   TSH 2.410 04/11/2019 1314   TSH 3.130 12/28/2017 1006      OBESITY BEHAVIORAL INTERVENTION VISIT  Today's visit was # 23   Starting weight: 261 lbs Starting date: 12/28/17 Today's weight : 258 lbs  Today's date: 05/03/2019 Total lbs lost to date: 3    ASK: We discussed the diagnosis of obesity with [name] today and Harly agreed to give [name] permission to discuss obesity behavioral modification therapy today.  ASSESS: Aamna has the diagnosis of obesity and her BMI today is 48.77 Jearlene is in the action stage of change   ADVISE: Illyanna was educated on the multiple health risks of obesity as well as the benefit of weight loss to improve her health. She was advised of the need for long term treatment and the importance of lifestyle modifications to improve her current health and to decrease her risk of future health problems.  AGREE: Multiple dietary modification options and treatment options were discussed and  Anntoinette agreed to follow the recommendations documented in the above note.  ARRANGE: Trinidad was educated on the importance of frequent visits to treat obesity as outlined per CMS and USPSTF guidelines and agreed to schedule  her next follow up appointment today.  I, Trixie Dredge, am acting as transcriptionist for Dennard Nip, MD  I have reviewed the above documentation for accuracy and completeness, and I agree with the above. -Dennard Nip, MD

## 2019-05-05 ENCOUNTER — Other Ambulatory Visit (INDEPENDENT_AMBULATORY_CARE_PROVIDER_SITE_OTHER): Payer: Self-pay | Admitting: Family Medicine

## 2019-05-05 DIAGNOSIS — E559 Vitamin D deficiency, unspecified: Secondary | ICD-10-CM

## 2019-05-07 ENCOUNTER — Other Ambulatory Visit (INDEPENDENT_AMBULATORY_CARE_PROVIDER_SITE_OTHER): Payer: Self-pay | Admitting: Family Medicine

## 2019-05-07 DIAGNOSIS — F3289 Other specified depressive episodes: Secondary | ICD-10-CM

## 2019-05-25 ENCOUNTER — Other Ambulatory Visit (INDEPENDENT_AMBULATORY_CARE_PROVIDER_SITE_OTHER): Payer: Self-pay | Admitting: Family Medicine

## 2019-05-25 ENCOUNTER — Other Ambulatory Visit: Payer: Self-pay

## 2019-05-25 ENCOUNTER — Encounter (INDEPENDENT_AMBULATORY_CARE_PROVIDER_SITE_OTHER): Payer: Self-pay | Admitting: Family Medicine

## 2019-05-25 ENCOUNTER — Telehealth (INDEPENDENT_AMBULATORY_CARE_PROVIDER_SITE_OTHER): Payer: BC Managed Care – PPO | Admitting: Family Medicine

## 2019-05-25 DIAGNOSIS — Z6841 Body Mass Index (BMI) 40.0 and over, adult: Secondary | ICD-10-CM

## 2019-05-25 DIAGNOSIS — F3289 Other specified depressive episodes: Secondary | ICD-10-CM | POA: Diagnosis not present

## 2019-05-25 DIAGNOSIS — R7303 Prediabetes: Secondary | ICD-10-CM

## 2019-05-25 MED ORDER — METFORMIN HCL 500 MG PO TABS: 500.0000 mg | ORAL_TABLET | Freq: Every day | ORAL | 0 refills | Status: DC

## 2019-05-25 MED ORDER — BUPROPION HCL ER (SR) 200 MG PO TB12: 200.0000 mg | ORAL_TABLET | Freq: Every day | ORAL | 0 refills | Status: DC

## 2019-05-25 MED ORDER — TOPIRAMATE 50 MG PO TABS: 50.0000 mg | ORAL_TABLET | Freq: Every day | ORAL | 0 refills | Status: DC

## 2019-05-26 NOTE — Progress Notes (Signed)
Office: [phone]  /  Fax: [phone] TeleHealth Visit:  [name] has verbally consented to this TeleHealth visit today. The patient is located at home, the provider is located at the [facility] office. The participants in this visit include the listed provider and patient. The visit was conducted today via webex.  HPI:   Chief Complaint: OBESITY [name] is here to discuss her progress with her obesity treatment plan. She is on the Category 2 plan and is following her eating plan approximately 60 % of the time. She states she is more mindful of her steps. [name] feels she is doing well with weight loss, and has lost approximately 2 lbs since her last visit. She is doing better with the journaling and feels her motivation has improved. Her hunger is controlled and she is trying to increase steps while working from home.  We were unable to weigh the patient today for this TeleHealth visit. She feels as if she has lost 2 lbs since her last visit. She has lost 3 lbs since starting treatment with [name].  Pre-Diabetes [name] has a diagnosis of pre-diabetes based on her elevated Hgb A1c and was informed this puts her at greater risk of developing diabetes. She is stable on metformin and diet, and denies nausea or vomiting. She is doing better with decreasing sugary foods.   Depression with Emotional Eating Behaviors Baker's mood has improved on both topiramate and Wellbutrin. She feels more in control of her emotional eating. [name] struggles with emotional eating and using food for comfort to the extent that it is negatively impacting her health. She often snacks when she is not hungry. [name] sometimes feels she is out of control and then feels guilty that she made poor food choices. She has been working on behavior modification techniques to help reduce her emotional eating and has been somewhat successful. She shows no sign of suicidal or homicidal ideations.  Depression  screen PHQ 2/9 12/28/2017  Decreased Interest 1  Down, Depressed, Hopeless 0  PHQ - 2 Score 1  Altered sleeping 0  Tired, decreased energy 1  Change in appetite 1  Feeling bad or failure about yourself  0  Trouble concentrating 0  Moving slowly or fidgety/restless 0  Suicidal thoughts 0  PHQ-9 Score 3  Difficult doing work/chores Not difficult at all    ASSESSMENT AND PLAN:  Class 3 severe obesity with serious comorbidity and body mass index (BMI) of 45.0 to 49.9 in adult, unspecified obesity type (HCC)  Prediabetes - Plan: metFORMIN (GLUCOPHAGE) 500 MG tablet  Other depression - with emotional eating - Plan: topiramate (TOPAMAX) 50 MG tablet, buPROPion (WELLBUTRIN SR) 200 MG 12 hr tablet  PLAN:  Pre-Diabetes [name] will continue to work on weight loss, exercise, and decreasing simple carbohydrates in her diet to help decrease the risk of diabetes. We dicussed metformin including benefits and risks. She was informed that eating too many simple carbohydrates or too many calories at one sitting increases the likelihood of GI side effects. [name] agrees to continue taking metformin 500 mg q AM #30 and we will refill for 1 month. [name] agrees to follow up with our clinic in 2 weeks as directed to monitor her progress.  Depression with Emotional Eating Behaviors We discussed behavior modification techniques today to help [name] deal with her emotional eating and depression. [name] agrees to continue taking topiramate 50 mg q daily #30 and we will refill for 1 month; she agrees to continue taking  Wellbutrin SR 200 mg q daily #30 and we will refill for 1 month. [name] agrees to follow up with our clinic in 2 weeks.  Obesity [name] is currently in the action stage of change. As such, her goal is to continue with weight loss efforts She has agreed to keep a food journal with 1200 calories and 75+ grams of protein daily [name] has been instructed to work up to a goal of 150 minutes of  combined cardio and strengthening exercise per week for weight loss and overall health benefits. We discussed the following Behavioral Modification Strategies today: increasing lean protein intake, work on meal planning and easy cooking plans and emotional eating strategies   [name] has agreed to follow up with our clinic in 2 weeks. She was informed of the importance of frequent follow up visits to maximize her success with intensive lifestyle modifications for her multiple health conditions.  ALLERGIES: Allergies  Allergen Reactions  . No Known Allergies     MEDICATIONS: Current Outpatient Medications on File Prior to Visit  Medication Sig Dispense Refill  . acetaminophen (TYLENOL) 325 MG tablet Take 2 tablets (650 mg total) by mouth every 6 (six) hours as needed for mild pain (or Fever >/= 101).    [name] Kitchen amoxicillin (AMOXIL) 500 MG capsule Take 500 mg by mouth. Take 4 capsule by mouth prior to dental procedure    . aspirin EC 81 MG tablet Take 81 mg by mouth daily.    [name] Kitchen aspirin-acetaminophen-caffeine (EXCEDRIN MIGRAINE) 250-250-65 MG tablet Take by mouth every 6 (six) hours as needed for headache.    . Calcium 600-200 MG-UNIT tablet Take 1 tablet by mouth daily.    . cyclobenzaprine (FLEXERIL) 10 MG tablet Take 10 mg by mouth 3 (three) times daily as needed for muscle spasms.    . meloxicam (MOBIC) 15 MG tablet Take 15 mg by mouth daily.    . Multiple Vitamins-Minerals (MULTIVITAMIN WITH MINERALS) tablet Take 1 tablet by mouth daily.    . naproxen sodium (ALEVE) 220 MG tablet Take 220 mg by mouth daily as needed.    . triamterene-hydrochlorothiazide (MAXZIDE-25) 37.5-25 MG per tablet Take 1 tablet by mouth Daily.    . Vitamin D, Ergocalciferol, (DRISDOL) 1.25 MG (50000 UT) CAPS capsule Take 1 capsule (50,000 Units total) by mouth every 7 (seven) days. 4 capsule 0   No current facility-administered medications on file prior to visit.     PAST MEDICAL HISTORY: Past Medical History:   Diagnosis Date  . Arthritis   . Back pain   . 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 Hypertension   . Joint pain   . Leg edema   . Obesity    S/P lap band 2010  . Vitamin D deficiency     PAST SURGICAL HISTORY: Past Surgical History:  Procedure Laterality Date  . ABDOMINAL HYSTERECTOMY     s/p endometrial adenocarcinoma  . LAPAROSCOPIC GASTRIC BANDING  04/17/2009  . TOTAL KNEE ARTHROPLASTY Left 08/25/2016   Procedure: TOTAL KNEE ARTHROPLASTY;  Surgeon: Elsie Saas, MD;  Location: [name];  Service: Orthopedics;  Laterality: Left;    SOCIAL HISTORY: Social History   Tobacco Use  . Smoking status: Never Smoker  . Smokeless tobacco: Never Used  Substance Use Topics  . Alcohol use: No  . Drug use: No    FAMILY HISTORY: Family History  Problem Relation Age of Onset  . Hypertension Mother   . Kidney disease Mother   . Hypertension Sister   . Hypertension  Maternal Grandmother     ROS: Review of Systems  Constitutional: Positive for weight loss.  Gastrointestinal: Negative for nausea and vomiting.  Psychiatric/Behavioral: Positive for depression. Negative for suicidal ideas.    PHYSICAL EXAM: Pt in no acute distress  RECENT LABS AND TESTS: BMET    Component Value Date/Time   NA 144 04/11/2019 1314   K 4.5 04/11/2019 1314   CL 105 04/11/2019 1314   CO2 21 04/11/2019 1314   GLUCOSE 81 04/11/2019 1314   GLUCOSE 125 (H) 08/27/2016 0505   BUN 18 04/11/2019 1314   CREATININE 0.87 04/11/2019 1314   CALCIUM 10.1 04/11/2019 1314   GFRNONAA 76 04/11/2019 1314   GFRAA 88 04/11/2019 1314   Lab Results  Component Value Date   HGBA1C 5.7 (H) 04/11/2019   HGBA1C 5.6 08/10/2018   HGBA1C 5.6 05/18/2018   HGBA1C 5.7 (H) 12/28/2017   HGBA1C 5.7 (H) 08/15/2016   Lab Results  Component Value Date   INSULIN 10.4 04/11/2019   INSULIN 7.7 08/10/2018   INSULIN 14.4 05/18/2018   INSULIN 14.0 12/28/2017   CBC    Component Value Date/Time   WBC 5.6 12/28/2017  1006   WBC 14.6 (H) 08/27/2016 0505   RBC 4.91 12/28/2017 1006   RBC 4.04 08/27/2016 0505   HGB 12.7 12/28/2017 1006   HCT 39.2 12/28/2017 1006   PLT 247 08/27/2016 0505   MCV 80 12/28/2017 1006   MCH 25.9 (L) 12/28/2017 1006   MCH 26.2 08/27/2016 0505   MCHC 32.4 12/28/2017 1006   MCHC 32.2 08/27/2016 0505   RDW 15.9 (H) 12/28/2017 1006   LYMPHSABS 0.9 12/28/2017 1006   EOSABS 0.2 12/28/2017 1006   BASOSABS 0.0 12/28/2017 1006   Iron/TIBC/Ferritin/ %Sat No results found for: IRON, TIBC, FERRITIN, IRONPCTSAT Lipid Panel     Component Value Date/Time   CHOL 214 (H) 04/11/2019 1314   TRIG 98 04/11/2019 1314   HDL 64 04/11/2019 1314   LDLCALC 130 (H) 04/11/2019 1314   Hepatic Function Panel     Component Value Date/Time   PROT 6.8 04/11/2019 1314   ALBUMIN 4.2 04/11/2019 1314   AST 21 04/11/2019 1314   ALT 14 04/11/2019 1314   ALKPHOS 82 04/11/2019 1314   BILITOT 0.5 04/11/2019 1314      Component Value Date/Time   TSH 2.410 04/11/2019 1314   TSH 3.130 12/28/2017 1006      I, Trixie Dredge, am acting as Location manager for Dennard Nip, MD I have reviewed the above documentation for accuracy and completeness, and I agree with the above. -Dennard Nip, MD

## 2019-05-27 ENCOUNTER — Other Ambulatory Visit (INDEPENDENT_AMBULATORY_CARE_PROVIDER_SITE_OTHER): Payer: Self-pay | Admitting: Family Medicine

## 2019-05-27 DIAGNOSIS — R7303 Prediabetes: Secondary | ICD-10-CM

## 2019-06-02 ENCOUNTER — Other Ambulatory Visit (INDEPENDENT_AMBULATORY_CARE_PROVIDER_SITE_OTHER): Payer: Self-pay | Admitting: Family Medicine

## 2019-06-02 DIAGNOSIS — E559 Vitamin D deficiency, unspecified: Secondary | ICD-10-CM

## 2019-06-04 ENCOUNTER — Other Ambulatory Visit (INDEPENDENT_AMBULATORY_CARE_PROVIDER_SITE_OTHER): Payer: Self-pay | Admitting: Family Medicine

## 2019-06-04 DIAGNOSIS — F3289 Other specified depressive episodes: Secondary | ICD-10-CM

## 2019-06-09 ENCOUNTER — Encounter (INDEPENDENT_AMBULATORY_CARE_PROVIDER_SITE_OTHER): Payer: Self-pay | Admitting: Family Medicine

## 2019-06-09 ENCOUNTER — Ambulatory Visit (INDEPENDENT_AMBULATORY_CARE_PROVIDER_SITE_OTHER): Payer: BC Managed Care – PPO | Admitting: Family Medicine

## 2019-06-09 ENCOUNTER — Other Ambulatory Visit: Payer: Self-pay

## 2019-06-09 VITALS — BP 115/75 | HR 71 | Temp 97.8°F | Ht 61.0 in | Wt 252.0 lb

## 2019-06-09 DIAGNOSIS — Z6841 Body Mass Index (BMI) 40.0 and over, adult: Secondary | ICD-10-CM

## 2019-06-09 DIAGNOSIS — Z9189 Other specified personal risk factors, not elsewhere classified: Secondary | ICD-10-CM

## 2019-06-09 DIAGNOSIS — E559 Vitamin D deficiency, unspecified: Secondary | ICD-10-CM

## 2019-06-09 DIAGNOSIS — R7303 Prediabetes: Secondary | ICD-10-CM | POA: Diagnosis not present

## 2019-06-09 MED ORDER — VITAMIN D (ERGOCALCIFEROL) 1.25 MG (50000 UNIT) PO CAPS: 50000.0000 [IU] | ORAL_CAPSULE | ORAL | 0 refills | Status: DC

## 2019-06-09 NOTE — Progress Notes (Signed)
Office: [phone]  /  Fax: [phone]   HPI:   Chief Complaint: OBESITY [name] is here to discuss her progress with her obesity treatment plan. She is keeping a food journal with 1200 calories and 75+ grams of protein and is following her eating plan approximately 60% of the time. She states she is exercising 0 minutes 0 times per week. [name] states she is not journaling but is mostly doing PC/[place]. She is working on increasing lean protein and trying to decrease sweets. Her weight is 252 lb (114.3 kg) today and has had a weight loss of 6 pounds over a period of 5 weeks since her last visit. She has lost 9 lbs since starting treatment with [name].  Vitamin D deficiency [name] has a diagnosis of Vitamin D deficiency, which is not yet at goal but is slowly improving on prescription Vit D. She denies nausea, vomiting or muscle weakness.  At risk for osteopenia and osteoporosis [name] is at higher risk of osteopenia and osteoporosis due to Vitamin D deficiency.   Pre-Diabetes [name] has a diagnosis of prediabetes based on her elevated HgA1c and was informed this puts her at greater risk of developing diabetes. She continues to work on diet and exercise to decrease risk of diabetes. She denies nausea or hypoglycemia.  ASSESSMENT AND PLAN:  Vitamin D deficiency - Plan: Vitamin D, Ergocalciferol, (DRISDOL) 1.25 MG (50000 UT) CAPS capsule  Prediabetes  At risk for osteoporosis  Class 3 severe obesity with serious comorbidity and body mass index (BMI) of 45.0 to 49.9 in adult, unspecified obesity type ([place])  PLAN:  Vitamin D Deficiency [name] was informed that low Vitamin D levels contributes to fatigue and are associated with obesity, breast, and colon cancer. She agrees to continue to take prescription Vit D @ 50,000 IU every week #4 with 0 refills and will follow-up for routine testing of Vitamin D in 1 month. She was informed of the risk of over-replacement of Vitamin D and agrees to  not increase hr dose unless she discusses this with [name] first. [name] agrees to follow-up with our clinic in 2-3 weeks.  At risk for osteopenia and osteoporosis [name] was given extended  (15 minutes) osteoporosis prevention counseling today. [name] is at risk for osteopenia and osteoporosis due to her Vitamin D deficiency. She was encouraged to take her Vitamin D and follow her higher calcium diet and increase strengthening exercise to help strengthen her bones and decrease her risk of osteopenia and osteoporosis.  Pre-Diabetes [name] will continue to work on weight loss, exercise, and decreasing simple carbohydrates in her diet to help decrease the risk of diabetes. We dicussed metformin including benefits and risks. She was informed that eating too many simple carbohydrates or too many calories at one sitting increases the likelihood of GI side effects. [name] will continue diet and exercise. She will have labs rechecked in 1 month and follow-up with [name] as directed to monitor her progress.  Obesity [name] is currently in the action stage of change. As such, her goal is to continue with weight loss efforts. She has agreed to portion control better and make smarter food choices, such as increase vegetables and decrease simple carbohydrates. [name] has been instructed to work up to a goal of 150 minutes of combined cardio and strengthening exercise per week for weight loss and overall health benefits. We discussed the following Behavioral Modification Strategies today: increasing lean protein intake, decreasing simple carbohydrates, increasing vegetables, and keep a strict food journal.  [name] has agreed to follow-up with our clinic in 2-3 weeks. She was informed of the importance of frequent follow-up visits to maximize her success with intensive lifestyle modifications for her multiple health conditions.  ALLERGIES: Allergies  Allergen Reactions  . No Known Allergies     MEDICATIONS:  Current Outpatient Medications on File Prior to Visit  Medication Sig Dispense Refill  . acetaminophen (TYLENOL) 325 MG tablet Take 2 tablets (650 mg total) by mouth every 6 (six) hours as needed for mild pain (or Fever >/= 101).    [name] Kitchen amoxicillin (AMOXIL) 500 MG capsule Take 500 mg by mouth. Take 4 capsule by mouth prior to dental procedure    . aspirin EC 81 MG tablet Take 81 mg by mouth daily.    [name] Kitchen aspirin-acetaminophen-caffeine (EXCEDRIN MIGRAINE) 250-250-65 MG tablet Take by mouth every 6 (six) hours as needed for headache.    [name] Kitchen buPROPion (WELLBUTRIN SR) 200 MG 12 hr tablet Take 1 tablet (200 mg total) by mouth daily. 30 tablet 0  . Calcium 600-200 MG-UNIT tablet Take 1 tablet by mouth daily.    . cyclobenzaprine (FLEXERIL) 10 MG tablet Take 10 mg by mouth 3 (three) times daily as needed for muscle spasms.    . meloxicam (MOBIC) 15 MG tablet Take 15 mg by mouth daily.    . metFORMIN (GLUCOPHAGE) 500 MG tablet Take 1 tablet (500 mg total) by mouth daily with breakfast. 30 tablet 0  . Multiple Vitamins-Minerals (MULTIVITAMIN WITH MINERALS) tablet Take 1 tablet by mouth daily.    . naproxen sodium (ALEVE) 220 MG tablet Take 220 mg by mouth daily as needed.    . topiramate (TOPAMAX) 50 MG tablet Take 1 tablet (50 mg total) by mouth daily. 30 tablet 0  . triamterene-hydrochlorothiazide (MAXZIDE-25) 37.5-25 MG per tablet Take 1 tablet by mouth Daily.     No current facility-administered medications on file prior to visit.     PAST MEDICAL HISTORY: Past Medical History:  Diagnosis Date  . Arthritis   . Back pain   . 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 Hypertension   . Joint pain   . Leg edema   . Obesity    S/P lap band 2010  . Vitamin D deficiency     PAST SURGICAL HISTORY: Past Surgical History:  Procedure Laterality Date  . ABDOMINAL HYSTERECTOMY     s/p endometrial adenocarcinoma  . LAPAROSCOPIC GASTRIC BANDING  04/17/2009  . TOTAL KNEE ARTHROPLASTY Left 08/25/2016    Procedure: TOTAL KNEE ARTHROPLASTY;  Surgeon: Elsie Saas, MD;  Location: [facility];  Service: Orthopedics;  Laterality: Left;    SOCIAL HISTORY: Social History   Tobacco Use  . Smoking status: Never Smoker  . Smokeless tobacco: Never Used  Substance Use Topics  . Alcohol use: No  . Drug use: No    FAMILY HISTORY: Family History  Problem Relation Age of Onset  . Hypertension Mother   . Kidney disease Mother   . Hypertension Sister   . Hypertension Maternal Grandmother    ROS: Review of Systems  Gastrointestinal: Negative for nausea and vomiting.  Musculoskeletal:       Negative for muscle weakness.  Endo/Heme/Allergies:       Negative for hypoglycemia.   PHYSICAL EXAM: Blood pressure 115/75, pulse 71, temperature 97.8 F (36.6 C), temperature source Oral, height 5\' 1"  (1.549 m), weight 252 lb (114.3 kg), SpO2 (!) 9 %. Body mass index is 47.61 kg/m. Physical Exam Vitals signs reviewed.  Constitutional:  Appearance: Normal appearance. She is obese.  Cardiovascular:     Rate and Rhythm: Normal rate.     Pulses: Normal pulses.  Pulmonary:     Effort: Pulmonary effort is normal.     Breath sounds: Normal breath sounds.  Musculoskeletal: Normal range of motion.  Skin:    General: Skin is warm and dry.  Neurological:     Mental Status: She is alert and oriented to person, place, and time.  Psychiatric:        Behavior: Behavior normal.   RECENT LABS AND TESTS: BMET    Component Value Date/Time   NA 144 04/11/2019 1314   K 4.5 04/11/2019 1314   CL 105 04/11/2019 1314   CO2 21 04/11/2019 1314   GLUCOSE 81 04/11/2019 1314   GLUCOSE 125 (H) 08/27/2016 0505   BUN 18 04/11/2019 1314   CREATININE 0.87 04/11/2019 1314   CALCIUM 10.1 04/11/2019 1314   GFRNONAA 76 04/11/2019 1314   GFRAA 88 04/11/2019 1314   Lab Results  Component Value Date   HGBA1C 5.7 (H) 04/11/2019   HGBA1C 5.6 08/10/2018   HGBA1C 5.6 05/18/2018   HGBA1C 5.7 (H) 12/28/2017   HGBA1C 5.7  (H) 08/15/2016   Lab Results  Component Value Date   INSULIN 10.4 04/11/2019   INSULIN 7.7 08/10/2018   INSULIN 14.4 05/18/2018   INSULIN 14.0 12/28/2017   CBC    Component Value Date/Time   WBC 5.6 12/28/2017 1006   WBC 14.6 (H) 08/27/2016 0505   RBC 4.91 12/28/2017 1006   RBC 4.04 08/27/2016 0505   HGB 12.7 12/28/2017 1006   HCT 39.2 12/28/2017 1006   PLT 247 08/27/2016 0505   MCV 80 12/28/2017 1006   MCH 25.9 (L) 12/28/2017 1006   MCH 26.2 08/27/2016 0505   MCHC 32.4 12/28/2017 1006   MCHC 32.2 08/27/2016 0505   RDW 15.9 (H) 12/28/2017 1006   LYMPHSABS 0.9 12/28/2017 1006   EOSABS 0.2 12/28/2017 1006   BASOSABS 0.0 12/28/2017 1006   Iron/TIBC/Ferritin/ %Sat No results found for: IRON, TIBC, FERRITIN, IRONPCTSAT Lipid Panel     Component Value Date/Time   CHOL 214 (H) 04/11/2019 1314   TRIG 98 04/11/2019 1314   HDL 64 04/11/2019 1314   LDLCALC 130 (H) 04/11/2019 1314   Hepatic Function Panel     Component Value Date/Time   PROT 6.8 04/11/2019 1314   ALBUMIN 4.2 04/11/2019 1314   AST 21 04/11/2019 1314   ALT 14 04/11/2019 1314   ALKPHOS 82 04/11/2019 1314   BILITOT 0.5 04/11/2019 1314      Component Value Date/Time   TSH 2.410 04/11/2019 1314   TSH 3.130 12/28/2017 1006   Results for GEORGETT, DEPA (MRN [id number]) as of 06/09/2019 10:54  Ref. Range 04/11/2019 13:14  Vitamin D, 25-Hydroxy Latest Ref Range: 30.0 - 100.0 ng/mL 36.9   OBESITY BEHAVIORAL INTERVENTION VISIT  Today's visit was #25  Starting weight: 261 lbs Starting date: 12/28/2017 Today's weight: 252 lbs  Today's date: 06/09/2019 Total lbs lost to date: 9    06/09/2019  Height 5\' 1"  (1.549 m)  Weight 252 lb (114.3 kg)  BMI (Calculated) 47.64  BLOOD PRESSURE - SYSTOLIC [id number]  BLOOD PRESSURE - DIASTOLIC 75   Body Fat % [id number] %  Total Body Water (lbs) 90 lbs   ASK: We discussed the diagnosis of obesity with [name] today and Milina agreed to give [name] permission to discuss  obesity behavioral modification therapy today.  ASSESS: [name]  has the diagnosis of obesity and her BMI today is 47.7. Blaze is in the action stage of change.   ADVISE: Numa was educated on the multiple health risks of obesity as well as the benefit of weight loss to improve her health. She was advised of the need for long term treatment and the importance of lifestyle modifications to improve her current health and to decrease her risk of future health problems.  AGREE: Multiple dietary modification options and treatment options were discussed and  Marisal agreed to follow the recommendations documented in the above note.  ARRANGE: Diem was educated on the importance of frequent visits to treat obesity as outlined per CMS and USPSTF guidelines and agreed to schedule her next follow up appointment today.  I, Michaelene Song, am acting as Location manager for Dennard Nip, MD I have reviewed the above documentation for accuracy and completeness, and I agree with the above. -Dennard Nip, MD

## 2019-06-22 ENCOUNTER — Other Ambulatory Visit (INDEPENDENT_AMBULATORY_CARE_PROVIDER_SITE_OTHER): Payer: Self-pay | Admitting: Family Medicine

## 2019-06-22 DIAGNOSIS — F3289 Other specified depressive episodes: Secondary | ICD-10-CM

## 2019-06-24 ENCOUNTER — Other Ambulatory Visit (INDEPENDENT_AMBULATORY_CARE_PROVIDER_SITE_OTHER): Payer: Self-pay | Admitting: Family Medicine

## 2019-06-24 DIAGNOSIS — R7303 Prediabetes: Secondary | ICD-10-CM

## 2019-06-30 ENCOUNTER — Ambulatory Visit (INDEPENDENT_AMBULATORY_CARE_PROVIDER_SITE_OTHER): Payer: BC Managed Care – PPO | Admitting: Family Medicine

## 2019-06-30 ENCOUNTER — Encounter (INDEPENDENT_AMBULATORY_CARE_PROVIDER_SITE_OTHER): Payer: Self-pay | Admitting: Family Medicine

## 2019-06-30 ENCOUNTER — Other Ambulatory Visit: Payer: Self-pay

## 2019-06-30 VITALS — BP 102/71 | HR 76 | Temp 97.9°F | Ht 61.0 in | Wt 248.0 lb

## 2019-06-30 DIAGNOSIS — R7303 Prediabetes: Secondary | ICD-10-CM | POA: Diagnosis not present

## 2019-06-30 DIAGNOSIS — F3289 Other specified depressive episodes: Secondary | ICD-10-CM

## 2019-06-30 DIAGNOSIS — E559 Vitamin D deficiency, unspecified: Secondary | ICD-10-CM | POA: Diagnosis not present

## 2019-06-30 DIAGNOSIS — Z6841 Body Mass Index (BMI) 40.0 and over, adult: Secondary | ICD-10-CM

## 2019-06-30 MED ORDER — METFORMIN HCL 500 MG PO TABS: 500.0000 mg | ORAL_TABLET | Freq: Every day | ORAL | 0 refills | Status: DC

## 2019-06-30 MED ORDER — BUPROPION HCL ER (SR) 200 MG PO TB12: 200.0000 mg | ORAL_TABLET | Freq: Every day | ORAL | 0 refills | Status: DC

## 2019-06-30 MED ORDER — VITAMIN D (ERGOCALCIFEROL) 1.25 MG (50000 UNIT) PO CAPS: 50000.0000 [IU] | ORAL_CAPSULE | ORAL | 0 refills | Status: DC

## 2019-07-01 ENCOUNTER — Other Ambulatory Visit (INDEPENDENT_AMBULATORY_CARE_PROVIDER_SITE_OTHER): Payer: Self-pay | Admitting: Family Medicine

## 2019-07-01 DIAGNOSIS — E559 Vitamin D deficiency, unspecified: Secondary | ICD-10-CM

## 2019-07-03 ENCOUNTER — Other Ambulatory Visit (INDEPENDENT_AMBULATORY_CARE_PROVIDER_SITE_OTHER): Payer: Self-pay | Admitting: Family Medicine

## 2019-07-03 DIAGNOSIS — F3289 Other specified depressive episodes: Secondary | ICD-10-CM

## 2019-07-21 ENCOUNTER — Ambulatory Visit
Admission: RE | Admit: 2019-07-21 | Discharge: 2019-07-21 | Disposition: A | Discharge status: Home / Self Care | Payer: BC Managed Care – PPO | Source: Ambulatory Visit | Attending: Family Medicine | Admitting: Family Medicine

## 2019-07-21 ENCOUNTER — Other Ambulatory Visit: Payer: Self-pay

## 2019-07-21 ENCOUNTER — Other Ambulatory Visit: Payer: Self-pay | Admitting: Family Medicine

## 2019-07-21 DIAGNOSIS — M5430 Sciatica, unspecified side: Secondary | ICD-10-CM

## 2019-07-21 DIAGNOSIS — M545 Low back pain: Secondary | ICD-10-CM | POA: Diagnosis not present

## 2019-07-24 NOTE — Progress Notes (Signed)
Office: [phone]  /  Fax: [phone]   HPI:   Chief Complaint: OBESITY [name] is here to discuss her progress with her obesity treatment plan. She is on the Category 2 plan and is following her eating plan approximately 60-70 % of the time. She states she is walking and taking swimming lessons for 45 minutes 4 times per week. [name] has done well with weight loss on her plan. She is considering weight loss surgery though. She is status post lap band.  Her weight is 248 lb (112.5 kg) today and has had a weight loss of 4 pounds over a period of 3 weeks since her last visit. She has lost 13 lbs since starting treatment with [name].  Pre-Diabetes [name] has a diagnosis of pre-diabetes based on her elevated Hgb A1c and was informed this puts her at greater risk of developing diabetes. She is stable on metformin and continues to work on diet and exercise to decrease risk of diabetes. She denies nausea or vomiting.  Vitamin D Deficiency [name] has a diagnosis of vitamin D deficiency. She is stable on prescription Vit D and denies nausea, vomiting or muscle weakness.  Depression with Emotional Eating Behaviors [name]'s mood has improved on Wellbutrin. She denies insomnia or palpitations. [name] struggles with emotional eating and using food for comfort to the extent that it is negatively impacting her health. She often snacks when she is not hungry. [name] sometimes feels she is out of control and then feels guilty that she made poor food choices. She has been working on behavior modification techniques to help reduce her emotional eating and has been somewhat successful. She shows no sign of suicidal or homicidal ideations.  Depression screen PHQ 2/9 12/28/2017  Decreased Interest 1  Down, Depressed, Hopeless 0  PHQ - 2 Score 1  Altered sleeping 0  Tired, decreased energy 1  Change in appetite 1  Feeling bad or failure about yourself  0  Trouble concentrating 0  Moving slowly or  fidgety/restless 0  Suicidal thoughts 0  PHQ-9 Score 3  Difficult doing work/chores Not difficult at all    ASSESSMENT AND PLAN:  Prediabetes - Plan: metFORMIN (GLUCOPHAGE) 500 MG tablet  Vitamin D deficiency - Plan: Vitamin D, Ergocalciferol, (DRISDOL) 1.25 MG (50000 UT) CAPS capsule  Other depression - with emotional eating - Plan: buPROPion (WELLBUTRIN SR) 200 MG 12 hr tablet  Class 3 severe obesity with serious comorbidity and body mass index (BMI) of 45.0 to 49.9 in adult, unspecified obesity type ([place])  PLAN:  Pre-Diabetes [name] will continue to work on weight loss, exercise, and decreasing simple carbohydrates in her diet to help decrease the risk of diabetes. We dicussed metformin including benefits and risks. She was informed that eating too many simple carbohydrates or too many calories at one sitting increases the likelihood of GI side effects. [name] agrees to continue taking metformin 500 mg PO q AM #30 and we will refill for 1 month. [name] agrees to follow up with our clinic in 3 to 4 weeks as directed to monitor her progress.  Vitamin D Deficiency [name] was informed that low vitamin D levels contributes to fatigue and are associated with obesity, breast, and colon cancer. [name] agrees to continue taking prescription Vit D 50,000 IU every week #4 and we will refill for 1 month. She will follow up for routine testing of vitamin D, at least 2-3 times per year. She was informed of the risk of over-replacement of vitamin D and agrees  to not increase her dose unless she discusses this with [name] first. [name] agrees to follow up with our clinic in 3 to 4 weeks.  Depression with Emotional Eating Behaviors We discussed behavior modification techniques today to help [name] deal with her emotional eating and depression. [name] agrees to continue taking Wellbutrin SR 200 mg PO q daily #30 and we will refill for 1 month. [name] agrees to follow up with our clinic in 3 to 4  weeks.  Obesity [name] is currently in the action stage of change. As such, her goal is to continue with weight loss efforts She has agreed to follow the Category 2 plan [name] has been instructed to work up to a goal of 150 minutes of combined cardio and strengthening exercise per week for weight loss and overall health benefits. We discussed the following Behavioral Modification Strategies today: increasing lean protein intake and decreasing simple carbohydrates  [name] was encouraged to schedule an appointment with the bariatric surgeons to see what her options are.  [name] has agreed to follow up with our clinic in 3 to 4 weeks. She was informed of the importance of frequent follow up visits to maximize her success with intensive lifestyle modifications for her multiple health conditions.  ALLERGIES: Allergies  Allergen Reactions   No Known Allergies     MEDICATIONS: Current Outpatient Medications on File Prior to Visit  Medication Sig Dispense Refill   acetaminophen (TYLENOL) 325 MG tablet Take 2 tablets (650 mg total) by mouth every 6 (six) hours as needed for mild pain (or Fever >/= 101).     amoxicillin (AMOXIL) 500 MG capsule Take 500 mg by mouth. Take 4 capsule by mouth prior to dental procedure     aspirin EC 81 MG tablet Take 81 mg by mouth daily.     aspirin-acetaminophen-caffeine (EXCEDRIN MIGRAINE) 250-250-65 MG tablet Take by mouth every 6 (six) hours as needed for headache.     Calcium 600-200 MG-UNIT tablet Take 1 tablet by mouth daily.     cyclobenzaprine (FLEXERIL) 10 MG tablet Take 10 mg by mouth 3 (three) times daily as needed for muscle spasms.     meloxicam (MOBIC) 15 MG tablet Take 15 mg by mouth daily.     Multiple Vitamins-Minerals (MULTIVITAMIN WITH MINERALS) tablet Take 1 tablet by mouth daily.     naproxen sodium (ALEVE) 220 MG tablet Take 220 mg by mouth daily as needed.     topiramate (TOPAMAX) 50 MG tablet Take 1 tablet (50 mg total) by mouth  daily. 30 tablet 0   triamterene-hydrochlorothiazide (MAXZIDE-25) 37.5-25 MG per tablet Take 1 tablet by mouth Daily.     No current facility-administered medications on file prior to visit.     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   Hypertension    Joint pain    Leg edema    Obesity    S/P lap band 2010   Vitamin D deficiency     PAST SURGICAL HISTORY: Past Surgical History:  Procedure Laterality Date   ABDOMINAL HYSTERECTOMY     s/p endometrial adenocarcinoma   LAPAROSCOPIC GASTRIC BANDING  04/17/2009   TOTAL KNEE ARTHROPLASTY Left 08/25/2016   Procedure: TOTAL KNEE ARTHROPLASTY;  Surgeon: Elsie Saas, MD;  Location: [place];  Service: Orthopedics;  Laterality: Left;    SOCIAL HISTORY: Social History   Tobacco Use   Smoking status: Never Smoker  Smokeless tobacco: Never Used  Substance Use Topics   Alcohol use: No   Drug use: No    FAMILY HISTORY: Family History  Problem Relation Age of Onset   Hypertension Mother    Kidney disease Mother    Hypertension Sister    Hypertension Maternal Grandmother     ROS: Review of Systems  Constitutional: Positive for weight loss.  Cardiovascular: Negative for palpitations.  Gastrointestinal: Negative for nausea and vomiting.  Musculoskeletal:       Negative muscle weakness  Psychiatric/Behavioral: Positive for depression. Negative for suicidal ideas. The patient does not have insomnia.     PHYSICAL EXAM: Blood pressure 102/71, pulse 76, temperature 97.9 F (36.6 C), temperature source Oral, height 5\' 1"  (1.549 m), weight 248 lb (112.5 kg), SpO2 98 %. Body mass index is 46.86 kg/m. Physical Exam Vitals signs reviewed.  Constitutional:      Appearance: Normal appearance. She is obese.  Cardiovascular:     Rate and Rhythm: Normal rate.     Pulses: Normal pulses.  Pulmonary:     Effort: Pulmonary  effort is normal.     Breath sounds: Normal breath sounds.  Musculoskeletal: Normal range of motion.  Skin:    General: Skin is warm and dry.  Neurological:     Mental Status: She is alert and oriented to person, place, and time.  Psychiatric:        Mood and Affect: Mood normal.        Behavior: Behavior normal.     RECENT LABS AND TESTS: BMET    Component Value Date/Time   NA 144 04/11/2019 1314   K 4.5 04/11/2019 1314   CL 105 04/11/2019 1314   CO2 21 04/11/2019 1314   GLUCOSE 81 04/11/2019 1314   GLUCOSE 125 (H) 08/27/2016 0505   BUN 18 04/11/2019 1314   CREATININE 0.87 04/11/2019 1314   CALCIUM 10.1 04/11/2019 1314   GFRNONAA 76 04/11/2019 1314   GFRAA 88 04/11/2019 1314   Lab Results  Component Value Date   HGBA1C 5.7 (H) 04/11/2019   HGBA1C 5.6 08/10/2018   HGBA1C 5.6 05/18/2018   HGBA1C 5.7 (H) 12/28/2017   HGBA1C 5.7 (H) 08/15/2016   Lab Results  Component Value Date   INSULIN 10.4 04/11/2019   INSULIN 7.7 08/10/2018   INSULIN 14.4 05/18/2018   INSULIN 14.0 12/28/2017   CBC    Component Value Date/Time   WBC 5.6 12/28/2017 1006   WBC 14.6 (H) 08/27/2016 0505   RBC 4.91 12/28/2017 1006   RBC 4.04 08/27/2016 0505   HGB 12.7 12/28/2017 1006   HCT 39.2 12/28/2017 1006   PLT 247 08/27/2016 0505   MCV 80 12/28/2017 1006   MCH 25.9 (L) 12/28/2017 1006   MCH 26.2 08/27/2016 0505   MCHC 32.4 12/28/2017 1006   MCHC 32.2 08/27/2016 0505   RDW 15.9 (H) 12/28/2017 1006   LYMPHSABS 0.9 12/28/2017 1006   EOSABS 0.2 12/28/2017 1006   BASOSABS 0.0 12/28/2017 1006   Iron/TIBC/Ferritin/ %Sat No results found for: IRON, TIBC, FERRITIN, IRONPCTSAT Lipid Panel     Component Value Date/Time   CHOL 214 (H) 04/11/2019 1314   TRIG 98 04/11/2019 1314   HDL 64 04/11/2019 1314   LDLCALC 130 (H) 04/11/2019 1314   Hepatic Function Panel     Component Value Date/Time   PROT 6.8 04/11/2019 1314   ALBUMIN 4.2 04/11/2019 1314   AST 21 04/11/2019 1314   ALT 14  04/11/2019 1314   ALKPHOS 82  04/11/2019 1314   BILITOT 0.5 04/11/2019 1314      Component Value Date/Time   TSH 2.410 04/11/2019 1314   TSH 3.130 12/28/2017 1006      OBESITY BEHAVIORAL INTERVENTION VISIT  Today's visit was # 26   Starting weight: 261 lbs Starting date: 12/28/17 Today's weight : 248 lbs Today's date: 06/30/2019 Total lbs lost to date: 13    ASK: We discussed the diagnosis of obesity with [name] today and Kamree agreed to give [name] permission to discuss obesity behavioral modification therapy today.  ASSESS: Aurorah has the diagnosis of obesity and her BMI today is 46.88 Nicholette is in the action stage of change   ADVISE: Maaliyah was educated on the multiple health risks of obesity as well as the benefit of weight loss to improve her health. She was advised of the need for long term treatment and the importance of lifestyle modifications to improve her current health and to decrease her risk of future health problems.  AGREE: Multiple dietary modification options and treatment options were discussed and  [place] agreed to follow the recommendations documented in the above note.  ARRANGE: Averil was educated on the importance of frequent visits to treat obesity as outlined per CMS and USPSTF guidelines and agreed to schedule her next follow up appointment today.  I, Trixie Dredge, am acting as transcriptionist for Dennard Nip, MD  I have reviewed the above documentation for accuracy and completeness, and I agree with the above. -Dennard Nip, MD

## 2019-07-25 ENCOUNTER — Ambulatory Visit (INDEPENDENT_AMBULATORY_CARE_PROVIDER_SITE_OTHER): Payer: BC Managed Care – PPO | Admitting: Family Medicine

## 2019-07-25 ENCOUNTER — Encounter (INDEPENDENT_AMBULATORY_CARE_PROVIDER_SITE_OTHER): Payer: Self-pay | Admitting: Family Medicine

## 2019-07-25 ENCOUNTER — Other Ambulatory Visit: Payer: Self-pay

## 2019-07-25 VITALS — BP 116/81 | HR 84 | Temp 97.7°F | Ht 61.0 in | Wt 253.0 lb

## 2019-07-25 DIAGNOSIS — Z9189 Other specified personal risk factors, not elsewhere classified: Secondary | ICD-10-CM | POA: Diagnosis not present

## 2019-07-25 DIAGNOSIS — E559 Vitamin D deficiency, unspecified: Secondary | ICD-10-CM

## 2019-07-25 DIAGNOSIS — F3289 Other specified depressive episodes: Secondary | ICD-10-CM

## 2019-07-25 DIAGNOSIS — E7800 Pure hypercholesterolemia, unspecified: Secondary | ICD-10-CM | POA: Diagnosis not present

## 2019-07-25 DIAGNOSIS — R7303 Prediabetes: Secondary | ICD-10-CM | POA: Diagnosis not present

## 2019-07-25 DIAGNOSIS — Z6841 Body Mass Index (BMI) 40.0 and over, adult: Secondary | ICD-10-CM

## 2019-07-25 MED ORDER — TOPIRAMATE 50 MG PO TABS: 50.0000 mg | ORAL_TABLET | Freq: Every day | ORAL | 0 refills | Status: DC

## 2019-07-25 NOTE — Progress Notes (Signed)
Office: [phone]  /  Fax: [phone]   HPI:   Chief Complaint: OBESITY [name] is here to discuss her progress with her obesity treatment plan. She is on the Category 2 plan and is following her eating plan approximately 50 % of the time. She states she is taking swimming lessons for 45 minutes 2 times per week. [name] has had a lot of celebration eating and has gained approximately 5 lbs. She feels she is ready to get back on track. She is status post lap band, but she is considering changing to gastric bypass.  Her weight is 253 lb (114.8 kg) today and has gained 5 lbs since her last visit. She has lost 8 lbs since starting treatment with [name].  Vitamin D Deficiency [name] has a diagnosis of vitamin D deficiency. She is stable on prescription Vit D, and she id due for labs. Last Vit D level was not yet at goal. She denies nausea, vomiting or muscle weakness.  Pre-Diabetes [name] has a diagnosis of pre-diabetes based on her elevated Hgb A1c and was informed this puts her at greater risk of developing diabetes. She is working on diet, but level is not yet controlled. She denies nausea or vomiting. She continues to work on exercise to decrease risk of diabetes.   At risk for diabetes [name] is at higher than average risk for developing diabetes due to her obesity and pre-diabetes. She currently denies polyuria or polydipsia.  Hyperlipidemia (Pure) [name] has hyperlipidemia and has been trying to improve her cholesterol levels with intensive lifestyle modification including a low saturated fat diet, exercise and weight loss. Last level isolated elevated LDL. She denies any chest pain, claudication or myalgias. She is not on statin.  At risk for cardiovascular disease [name] is at a higher than average risk for cardiovascular disease due to obesity. She currently denies any chest pain. Depression with Emotional Eating Behaviors [name] is tolerating Topamax well and denies side effects.  She notes decreased emotional eating. [name] struggles with emotional eating and using food for comfort to the extent that it is negatively impacting her health. She often snacks when she is not hungry. [name] sometimes feels she is out of control and then feels guilty that she made poor food choices. She has been working on behavior modification techniques to help reduce her emotional eating and has been somewhat successful. She shows no sign of suicidal or homicidal ideations.  Depression screen PHQ 2/9 12/28/2017  Decreased Interest 1  Down, Depressed, Hopeless 0  PHQ - 2 Score 1  Altered sleeping 0  Tired, decreased energy 1  Change in appetite 1  Feeling bad or failure about yourself  0  Trouble concentrating 0  Moving slowly or fidgety/restless 0  Suicidal thoughts 0  PHQ-9 Score 3  Difficult doing work/chores Not difficult at all    ASSESSMENT AND PLAN:  Vitamin D deficiency - Plan: VITAMIN D 25 Hydroxy (Vit-D Deficiency, Fractures)  Prediabetes - Plan: Comprehensive metabolic panel, Hemoglobin A1c, Insulin, random  Pure hypercholesterolemia - Plan: Lipid Panel With LDL/HDL Ratio  Other depression - with emotional eating - Plan: topiramate (TOPAMAX) 50 MG tablet  Class 3 severe obesity with serious comorbidity and body mass index (BMI) of 45.0 to 49.9 in adult, unspecified obesity type (HCC)  PLAN:  Vitamin D Deficiency [name] was informed that low vitamin D levels contributes to fatigue and are associated with obesity, breast, and colon cancer. [name] agrees to continue taking prescription Vit D 50,000 IU every  week and will follow up for routine testing of vitamin D, at least 2-3 times per year. She was informed of the risk of over-replacement of vitamin D and agrees to not increase her dose unless she discusses this with [name] first. We will check labs today. [name] agrees to follow up with our clinic in 3 weeks.  Pre-Diabetes [name] will continue to work on weight loss, diet,  exercise, and decreasing simple carbohydrates in her diet to help decrease the risk of diabetes. We dicussed metformin including benefits and risks. She was informed that eating too many simple carbohydrates or too many calories at one sitting increases the likelihood of GI side effects. [name] agrees to continue taking metformin 500 mg PO q AM #30 and we will refill for 1 month. We will check labs today. [name] agrees to follow up with our clinic in 3 weeks as directed to monitor her progress.  Diabetes risk counseling [name] was given extended (15 minutes) diabetes prevention counseling today. She is 53 y.o. female and has risk factors for diabetes including obesity and pre-diabetes. We discussed intensive lifestyle modifications today with an emphasis on weight loss as well as increasing exercise and decreasing simple carbohydrates in her diet.  Hyperlipidemia (Pure) [name] was informed of the American Heart Association Guidelines emphasizing intensive lifestyle modifications as the first line treatment for hyperlipidemia. We discussed many lifestyle modifications today in depth, and [name] will continue to work on decreasing saturated fats such as fatty red meat, butter and many fried foods. She will also increase vegetables and lean protein in her diet and continue to work on diet, exercise, and weight loss efforts. We will check labs today.  At risk for cardiovascular disease [name] is at a higher than average risk for cardiovascular disease due to obesity. She currently denies any chest pain. Depression with Emotional Eating Behaviors We discussed behavior modification techniques today to help [name] deal with her emotional eating and depression. [name] agrees to continue taking Topiramate 50 mg PO q daily #30 and we will refill for 1 month. [name] agrees to follow up with our clinic in 3 weeks.  Obesity [name] is currently in the action stage of change. As such, her goal is to continue with  weight loss efforts She has agreed to keep a food journal with 1200 calories and 70+ grams of protein daily or follow the Category 2 plan [name] has been instructed to work up to a goal of 150 minutes of combined cardio and strengthening exercise per week for weight loss and overall health benefits. We discussed the following Behavioral Modification Strategies today: increasing lean protein intake and decreasing simple carbohydrates    Atiyah has agreed to follow up with our clinic in 3 weeks. She was informed of the importance of frequent follow up visits to maximize her success with intensive lifestyle modifications for her multiple health conditions.  ALLERGIES: Allergies  Allergen Reactions   No Known Allergies     MEDICATIONS: Current Outpatient Medications on File Prior to Visit  Medication Sig Dispense Refill   acetaminophen (TYLENOL) 325 MG tablet Take 2 tablets (650 mg total) by mouth every 6 (six) hours as needed for mild pain (or Fever >/= 101).     amoxicillin (AMOXIL) 500 MG capsule Take 500 mg by mouth. Take 4 capsule by mouth prior to dental procedure     aspirin EC 81 MG tablet Take 81 mg by mouth daily.     aspirin-acetaminophen-caffeine (EXCEDRIN MIGRAINE) 250-250-65 MG tablet Take by  mouth every 6 (six) hours as needed for headache.     Calcium 600-200 MG-UNIT tablet Take 1 tablet by mouth daily.     cyclobenzaprine (FLEXERIL) 10 MG tablet Take 10 mg by mouth 3 (three) times daily as needed for muscle spasms.     meloxicam (MOBIC) 15 MG tablet Take 15 mg by mouth daily.     metFORMIN (GLUCOPHAGE) 500 MG tablet Take 1 tablet (500 mg total) by mouth daily with breakfast. 30 tablet 0   Multiple Vitamins-Minerals (MULTIVITAMIN WITH MINERALS) tablet Take 1 tablet by mouth daily.     naproxen sodium (ALEVE) 220 MG tablet Take 220 mg by mouth daily as needed.     triamterene-hydrochlorothiazide (MAXZIDE-25) 37.5-25 MG per tablet Take 1 tablet by mouth Daily.      Vitamin D, Ergocalciferol, (DRISDOL) 1.25 MG (50000 UT) CAPS capsule Take 1 capsule (50,000 Units total) by mouth every 7 (seven) days. 4 capsule 0   buPROPion (WELLBUTRIN SR) 200 MG 12 hr tablet Take 1 tablet (200 mg total) by mouth daily. 30 tablet 0   No current facility-administered medications on file prior to visit.     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   Hypertension    Joint pain    Leg edema    Obesity    S/P lap band 2010   Vitamin D deficiency     PAST SURGICAL HISTORY: Past Surgical History:  Procedure Laterality Date   ABDOMINAL HYSTERECTOMY     s/p endometrial adenocarcinoma   LAPAROSCOPIC GASTRIC BANDING  04/17/2009   TOTAL KNEE ARTHROPLASTY Left 08/25/2016   Procedure: TOTAL KNEE ARTHROPLASTY;  Surgeon: Elsie Saas, MD;  Location: [facility];  Service: Orthopedics;  Laterality: Left;    SOCIAL HISTORY: Social History   Tobacco Use   Smoking status: Never Smoker   Smokeless tobacco: Never Used  Substance Use Topics   Alcohol use: No   Drug use: No    FAMILY HISTORY: Family History  Problem Relation Age of Onset   Hypertension Mother    Kidney disease Mother    Hypertension Sister    Hypertension Maternal Grandmother     ROS: Review of Systems  Constitutional: Negative for weight loss.  Cardiovascular: Negative for chest pain and claudication.  Gastrointestinal: Negative for nausea and vomiting.  Genitourinary: Negative for frequency.  Musculoskeletal: Negative for myalgias.       Negative muscle weakness  Endo/Heme/Allergies: Negative for polydipsia.  Psychiatric/Behavioral: Positive for depression. Negative for suicidal ideas.    PHYSICAL EXAM: Blood pressure 116/81, pulse 84, temperature 97.7 F (36.5 C), temperature source Oral, height 5\' 1"  (1.549 m), weight 253 lb (114.8 kg), SpO2 100 %. Body mass index is 47.8  kg/m. Physical Exam Vitals signs reviewed.  Constitutional:      Appearance: Normal appearance. She is obese.  Cardiovascular:     Rate and Rhythm: Normal rate.     Pulses: Normal pulses.  Pulmonary:     Effort: Pulmonary effort is normal.     Breath sounds: Normal breath sounds.  Musculoskeletal: Normal range of motion.  Skin:    General: Skin is warm and dry.  Neurological:     Mental Status: She is alert and oriented to person, place, and time.  Psychiatric:        Mood and Affect: Mood normal.        Behavior: Behavior normal.  RECENT LABS AND TESTS: BMET    Component Value Date/Time   NA 144 04/11/2019 1314   K 4.5 04/11/2019 1314   CL 105 04/11/2019 1314   CO2 21 04/11/2019 1314   GLUCOSE 81 04/11/2019 1314   GLUCOSE 125 (H) 08/27/2016 0505   BUN 18 04/11/2019 1314   CREATININE 0.87 04/11/2019 1314   CALCIUM 10.1 04/11/2019 1314   GFRNONAA 76 04/11/2019 1314   GFRAA 88 04/11/2019 1314   Lab Results  Component Value Date   HGBA1C 5.7 (H) 04/11/2019   HGBA1C 5.6 08/10/2018   HGBA1C 5.6 05/18/2018   HGBA1C 5.7 (H) 12/28/2017   HGBA1C 5.7 (H) 08/15/2016   Lab Results  Component Value Date   INSULIN 10.4 04/11/2019   INSULIN 7.7 08/10/2018   INSULIN 14.4 05/18/2018   INSULIN 14.0 12/28/2017   CBC    Component Value Date/Time   WBC 5.6 12/28/2017 1006   WBC 14.6 (H) 08/27/2016 0505   RBC 4.91 12/28/2017 1006   RBC 4.04 08/27/2016 0505   HGB 12.7 12/28/2017 1006   HCT 39.2 12/28/2017 1006   PLT 247 08/27/2016 0505   MCV 80 12/28/2017 1006   MCH 25.9 (L) 12/28/2017 1006   MCH 26.2 08/27/2016 0505   MCHC 32.4 12/28/2017 1006   MCHC 32.2 08/27/2016 0505   RDW 15.9 (H) 12/28/2017 1006   LYMPHSABS 0.9 12/28/2017 1006   EOSABS 0.2 12/28/2017 1006   BASOSABS 0.0 12/28/2017 1006   Iron/TIBC/Ferritin/ %Sat No results found for: IRON, TIBC, FERRITIN, IRONPCTSAT Lipid Panel     Component Value Date/Time   CHOL 214 (H) 04/11/2019 1314   TRIG 98  04/11/2019 1314   HDL 64 04/11/2019 1314   LDLCALC 130 (H) 04/11/2019 1314   Hepatic Function Panel     Component Value Date/Time   PROT 6.8 04/11/2019 1314   ALBUMIN 4.2 04/11/2019 1314   AST 21 04/11/2019 1314   ALT 14 04/11/2019 1314   ALKPHOS 82 04/11/2019 1314   BILITOT 0.5 04/11/2019 1314      Component Value Date/Time   TSH 2.410 04/11/2019 1314   TSH 3.130 12/28/2017 1006      OBESITY BEHAVIORAL INTERVENTION VISIT  Today's visit was # 27   Starting weight: 261 lbs Starting date: 12/28/17 Today's weight : 253 lbs Today's date: 07/25/2019 Total lbs lost to date: 8    ASK: We discussed the diagnosis of obesity with [name] today and Envy agreed to give [name] permission to discuss obesity behavioral modification therapy today.  ASSESS: Nashika has the diagnosis of obesity and her BMI today is 47.83 Guynell is in the action stage of change   ADVISE: Lakeysa was educated on the multiple health risks of obesity as well as the benefit of weight loss to improve her health. She was advised of the need for long term treatment and the importance of lifestyle modifications to improve her current health and to decrease her risk of future health problems.  AGREE: Multiple dietary modification options and treatment options were discussed and  Versa agreed to follow the recommendations documented in the above note.  ARRANGE: Glorene was educated on the importance of frequent visits to treat obesity as outlined per CMS and USPSTF guidelines and agreed to schedule her next follow up appointment today.  I, Trixie Dredge, am acting as transcriptionist for Dennard Nip, MD  I have reviewed the above documentation for accuracy and completeness, and I agree with the above. -Dennard Nip, MD

## 2019-07-26 ENCOUNTER — Other Ambulatory Visit (INDEPENDENT_AMBULATORY_CARE_PROVIDER_SITE_OTHER): Payer: Self-pay | Admitting: Family Medicine

## 2019-07-26 DIAGNOSIS — R7303 Prediabetes: Secondary | ICD-10-CM

## 2019-07-26 LAB — HEMOGLOBIN A1C
Est. average glucose Bld gHb Est-mCnc: 117 mg/dL
Hgb A1c MFr Bld: 5.7 % — ABNORMAL HIGH (ref 4.8–5.6)

## 2019-07-26 LAB — COMPREHENSIVE METABOLIC PANEL
ALT: 15 IU/L (ref 0–32)
AST: 22 IU/L (ref 0–40)
Albumin/Globulin Ratio: 1.5 (ref 1.2–2.2)
Albumin: 3.8 g/dL (ref 3.8–4.9)
Alkaline Phosphatase: 88 IU/L (ref 39–117)
BUN/Creatinine Ratio: 17 (ref 9–23)
BUN: 18 mg/dL (ref 6–24)
Bilirubin Total: 0.4 mg/dL (ref 0.0–1.2)
CO2: 23 mmol/L (ref 20–29)
Calcium: 9.8 mg/dL (ref 8.7–10.2)
Chloride: 107 mmol/L — ABNORMAL HIGH (ref 96–106)
Creatinine, Ser: 1.07 mg/dL — ABNORMAL HIGH (ref 0.57–1.00)
GFR calc Af Amer: 68 mL/min/{1.73_m2} (ref >59)
GFR calc non Af Amer: 59 mL/min/{1.73_m2} — ABNORMAL LOW (ref >59)
Globulin, Total: 2.6 g/dL (ref 1.5–4.5)
Glucose: 90 mg/dL (ref 65–99)
Potassium: 4.1 mmol/L (ref 3.5–5.2)
Sodium: 142 mmol/L (ref 134–144)
Total Protein: 6.4 g/dL (ref 6.0–8.5)

## 2019-07-26 LAB — VITAMIN D 25 HYDROXY (VIT D DEFICIENCY, FRACTURES): Vit D, 25-Hydroxy: 38.4 ng/mL (ref 30.0–100.0)

## 2019-07-26 LAB — LIPID PANEL WITH LDL/HDL RATIO
Cholesterol, Total: 189 mg/dL (ref 100–199)
HDL: 53 mg/dL (ref >39)
LDL Chol Calc (NIH): 115 mg/dL — ABNORMAL HIGH (ref 0–99)
LDL/HDL Ratio: 2.2 ratio (ref 0.0–3.2)
Triglycerides: 119 mg/dL (ref 0–149)
VLDL Cholesterol Cal: 21 mg/dL (ref 5–40)

## 2019-07-26 LAB — INSULIN, RANDOM: INSULIN: 16.3 u[IU]/mL (ref 2.6–24.9)

## 2019-07-26 MED ORDER — METFORMIN HCL 500 MG PO TABS: 500.0000 mg | ORAL_TABLET | Freq: Every day | ORAL | 0 refills | Status: DC

## 2019-07-27 ENCOUNTER — Encounter (INDEPENDENT_AMBULATORY_CARE_PROVIDER_SITE_OTHER): Payer: Self-pay | Admitting: Family Medicine

## 2019-07-31 ENCOUNTER — Other Ambulatory Visit (INDEPENDENT_AMBULATORY_CARE_PROVIDER_SITE_OTHER): Payer: Self-pay | Admitting: Family Medicine

## 2019-07-31 DIAGNOSIS — E559 Vitamin D deficiency, unspecified: Secondary | ICD-10-CM

## 2019-08-18 ENCOUNTER — Other Ambulatory Visit (INDEPENDENT_AMBULATORY_CARE_PROVIDER_SITE_OTHER): Payer: Self-pay | Admitting: Family Medicine

## 2019-08-18 ENCOUNTER — Other Ambulatory Visit: Payer: Self-pay

## 2019-08-18 ENCOUNTER — Ambulatory Visit (INDEPENDENT_AMBULATORY_CARE_PROVIDER_SITE_OTHER): Payer: BC Managed Care – PPO | Admitting: Family Medicine

## 2019-08-18 ENCOUNTER — Encounter (INDEPENDENT_AMBULATORY_CARE_PROVIDER_SITE_OTHER): Payer: Self-pay | Admitting: Family Medicine

## 2019-08-18 VITALS — BP 115/74 | HR 85 | Temp 98.2°F | Ht 61.0 in | Wt 252.0 lb

## 2019-08-18 DIAGNOSIS — F3289 Other specified depressive episodes: Secondary | ICD-10-CM

## 2019-08-18 DIAGNOSIS — E559 Vitamin D deficiency, unspecified: Secondary | ICD-10-CM

## 2019-08-18 DIAGNOSIS — Z6841 Body Mass Index (BMI) 40.0 and over, adult: Secondary | ICD-10-CM

## 2019-08-18 DIAGNOSIS — Z9189 Other specified personal risk factors, not elsewhere classified: Secondary | ICD-10-CM | POA: Diagnosis not present

## 2019-08-18 DIAGNOSIS — R7303 Prediabetes: Secondary | ICD-10-CM | POA: Diagnosis not present

## 2019-08-18 MED ORDER — TOPIRAMATE 50 MG PO TABS: 50.0000 mg | ORAL_TABLET | Freq: Every day | ORAL | 0 refills | Status: DC

## 2019-08-18 MED ORDER — VITAMIN D (ERGOCALCIFEROL) 1.25 MG (50000 UNIT) PO CAPS: 50000.0000 [IU] | ORAL_CAPSULE | ORAL | 0 refills | Status: DC

## 2019-08-18 MED ORDER — METFORMIN HCL 500 MG PO TABS: 500.0000 mg | ORAL_TABLET | Freq: Every day | ORAL | 0 refills | Status: DC

## 2019-08-22 ENCOUNTER — Other Ambulatory Visit (INDEPENDENT_AMBULATORY_CARE_PROVIDER_SITE_OTHER): Payer: Self-pay | Admitting: Family Medicine

## 2019-08-22 DIAGNOSIS — R7303 Prediabetes: Secondary | ICD-10-CM

## 2019-08-23 NOTE — Progress Notes (Signed)
Office: [phone]  /  Fax: [phone]   HPI:   Chief Complaint: OBESITY [name] is here to discuss her progress with her obesity treatment plan. She is on the keep a food journal with 1200 calories and 70+ grams of protein daily or follow the Category 2 plan and is following her eating plan approximately 60-70 % of the time. She states she is doing swim lessons for 45 minutes 3 times per week, and walking 5,500 steps a day. [name] has done well with weight loss. She has a history of lap band and is meeting with her surgeon next week to discuss additional weight loss surgery. She has questions about holiday eating.  Her weight is 252 lb (114.3 kg) today and has had a weight loss of 1 pound over a period of 3 to 4 weeks since her last visit. She has lost 9 lbs since starting treatment with [name].  Vitamin D Deficiency [name] has a diagnosis of vitamin D deficiency. She is stable on prescription Vit D, and is taking Ca+ supplements. She denies nausea, vomiting or muscle weakness.  Pre-Diabetes [name] has a diagnosis of pre-diabetes based on her elevated Hgb A1c and was informed this puts her at greater risk of developing diabetes. She is stable on metformin, and is doing well with diet and weight loss efforts. She denies nausea or hypoglycemia.  Depression with Emotional Eating Behaviors [name]'s mood is stable on Topamax. She feels she is dealing with stress better and decreased emotional eating. [name] struggles with emotional eating and using food for comfort to the extent that it is negatively impacting her health. She often snacks when she is not hungry. [name] sometimes feels she is out of control and then feels guilty that she made poor food choices. She has been working on behavior modification techniques to help reduce her emotional eating and has been somewhat successful. She shows no sign of suicidal or homicidal ideations.  Depression screen PHQ 2/9 12/28/2017  Decreased Interest 1   Down, Depressed, Hopeless 0  PHQ - 2 Score 1  Altered sleeping 0  Tired, decreased energy 1  Change in appetite 1  Feeling bad or failure about yourself  0  Trouble concentrating 0  Moving slowly or fidgety/restless 0  Suicidal thoughts 0  PHQ-9 Score 3  Difficult doing work/chores Not difficult at all    At risk for cardiovascular disease [name] is at a higher than average risk for cardiovascular disease due to obesity. She currently denies any chest pain.  ASSESSMENT AND PLAN:  Vitamin D deficiency - Plan: Vitamin D, Ergocalciferol, (DRISDOL) 1.25 MG (50000 UT) CAPS capsule  Prediabetes - Plan: metFORMIN (GLUCOPHAGE) 500 MG tablet  Other depression - with emotional eating - Plan: topiramate (TOPAMAX) 50 MG tablet  At risk for heart disease  Class 3 severe obesity with serious comorbidity and body mass index (BMI) of 45.0 to 49.9 in adult, unspecified obesity type ([place])  PLAN:  Vitamin D Deficiency [name] was informed that low vitamin D levels contributes to fatigue and are associated with obesity, breast, and colon cancer. [name] agrees to continue taking prescription Vit D 50,000 IU every week #4 and we will refill for 1 month. She will follow up for routine testing of vitamin D, at least 2-3 times per year. She was informed of the risk of over-replacement of vitamin D and agrees to not increase her dose unless she discusses this with [name] first. [name] agrees to follow up with our clinic in 4  weeks.  Pre-Diabetes [name] will continue to work on weight loss, exercise, and decreasing simple carbohydrates in her diet to help decrease the risk of diabetes. We dicussed metformin including benefits and risks. She was informed that eating too many simple carbohydrates or too many calories at one sitting increases the likelihood of GI side effects. [name] agrees to continue taking metformin 500 mg q AM #30 and we will refill for 1 month. [name] agrees to follow up with our clinic in 4  weeks as directed to monitor her progress.  Depression with Emotional Eating Behaviors We discussed behavior modification techniques today to help [name] deal with her emotional eating and depression. [name] agrees to continue taking Topamax 50 mg q daily #30 and we will refill for 1 month. [name] agrees to follow up with our clinic in 4 weeks.  Cardiovascular risk counseling [name] was given extended (15 minutes) coronary artery disease prevention counseling today. She is 53 y.o. female and has risk factors for heart disease including obesity. We discussed intensive lifestyle modifications today with an emphasis on specific weight loss instructions and strategies. Pt was also informed of the importance of increasing exercise and decreasing saturated fats to help prevent heart disease.  Obesity [name] is currently in the action stage of change. As such, her goal is to continue with weight loss efforts She has agreed to follow the Category 2 plan [name] has been instructed to work up to a goal of 150 minutes of combined cardio and strengthening exercise per week for weight loss and overall health benefits. We discussed the following Behavioral Modification Strategies today: holiday eating strategies    [name] has agreed to follow up with our clinic in 4 weeks. She was informed of the importance of frequent follow up visits to maximize her success with intensive lifestyle modifications for her multiple health conditions.  ALLERGIES: Allergies  Allergen Reactions  . No Known Allergies     MEDICATIONS: Current Outpatient Medications on File Prior to Visit  Medication Sig Dispense Refill  . acetaminophen (TYLENOL) 325 MG tablet Take 2 tablets (650 mg total) by mouth every 6 (six) hours as needed for mild pain (or Fever >/= 101).    [name] amoxicillin (AMOXIL) 500 MG capsule Take 500 mg by mouth. Take 4 capsule by mouth prior to dental procedure    . aspirin EC 81 MG tablet Take 81 mg by mouth  daily.    [name] aspirin-acetaminophen-caffeine (EXCEDRIN MIGRAINE) 250-250-65 MG tablet Take by mouth every 6 (six) hours as needed for headache.    [name] buPROPion (WELLBUTRIN SR) 200 MG 12 hr tablet Take 1 tablet (200 mg total) by mouth daily. 30 tablet 0  . Calcium 600-200 MG-UNIT tablet Take 1 tablet by mouth daily.    . cyclobenzaprine (FLEXERIL) 10 MG tablet Take 10 mg by mouth 3 (three) times daily as needed for muscle spasms.    . meloxicam (MOBIC) 15 MG tablet Take 15 mg by mouth daily.    . Multiple Vitamins-Minerals (MULTIVITAMIN WITH MINERALS) tablet Take 1 tablet by mouth daily.    . naproxen sodium (ALEVE) 220 MG tablet Take 220 mg by mouth daily as needed.    . triamterene-hydrochlorothiazide (MAXZIDE-25) 37.5-25 MG per tablet Take 1 tablet by mouth Daily.     No current facility-administered medications on file prior to visit.     PAST MEDICAL HISTORY: Past Medical History:  Diagnosis Date  . Arthritis   . Back pain   . 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  Hypertension   . Joint pain   . Leg edema   . Obesity    S/P lap band 2010  . Vitamin D deficiency     PAST SURGICAL HISTORY: Past Surgical History:  Procedure Laterality Date  . ABDOMINAL HYSTERECTOMY     s/p endometrial adenocarcinoma  . LAPAROSCOPIC GASTRIC BANDING  04/17/2009  . TOTAL KNEE ARTHROPLASTY Left 08/25/2016   Procedure: TOTAL KNEE ARTHROPLASTY;  Surgeon: Elsie Saas, MD;  Location: [facility];  Service: Orthopedics;  Laterality: Left;    SOCIAL HISTORY: Social History   Tobacco Use  . Smoking status: Never Smoker  . Smokeless tobacco: Never Used  Substance Use Topics  . Alcohol use: No  . Drug use: No    FAMILY HISTORY: Family History  Problem Relation Age of Onset  . Hypertension Mother   . Kidney disease Mother   . Hypertension Sister   . Hypertension Maternal Grandmother     ROS: Review of Systems  Constitutional: Positive for weight loss.  Cardiovascular: Negative for chest  pain.  Gastrointestinal: Negative for nausea and vomiting.  Musculoskeletal:       Negative muscle weakness  Endo/Heme/Allergies:       Negative hypoglycemia  Psychiatric/Behavioral: Positive for depression. Negative for suicidal ideas.    PHYSICAL EXAM: Blood pressure 115/74, pulse 85, temperature 98.2 F (36.8 C), temperature source Oral, height 5\' 1"  (1.549 m), weight 252 lb (114.3 kg), SpO2 100 %. Body mass index is 47.61 kg/m. Physical Exam Vitals signs reviewed.  Constitutional:      Appearance: Normal appearance. She is obese.  Cardiovascular:     Rate and Rhythm: Normal rate.     Pulses: Normal pulses.  Pulmonary:     Effort: Pulmonary effort is normal.     Breath sounds: Normal breath sounds.  Musculoskeletal: Normal range of motion.  Skin:    General: Skin is warm and dry.  Neurological:     Mental Status: She is alert and oriented to person, place, and time.  Psychiatric:        Mood and Affect: Mood normal.        Behavior: Behavior normal.     RECENT LABS AND TESTS: BMET    Component Value Date/Time   NA 142 07/25/2019 0844   K 4.1 07/25/2019 0844   CL 107 (H) 07/25/2019 0844   CO2 23 07/25/2019 0844   GLUCOSE 90 07/25/2019 0844   GLUCOSE 125 (H) 08/27/2016 0505   BUN 18 07/25/2019 0844   CREATININE 1.07 (H) 07/25/2019 0844   CALCIUM 9.8 07/25/2019 0844   GFRNONAA 59 (L) 07/25/2019 0844   GFRAA 68 07/25/2019 0844   Lab Results  Component Value Date   HGBA1C 5.7 (H) 07/25/2019   HGBA1C 5.7 (H) 04/11/2019   HGBA1C 5.6 08/10/2018   HGBA1C 5.6 05/18/2018   HGBA1C 5.7 (H) 12/28/2017   Lab Results  Component Value Date   INSULIN 16.3 07/25/2019   INSULIN 10.4 04/11/2019   INSULIN 7.7 08/10/2018   INSULIN 14.4 05/18/2018   INSULIN 14.0 12/28/2017   CBC    Component Value Date/Time   WBC 5.6 12/28/2017 1006   WBC 14.6 (H) 08/27/2016 0505   RBC 4.91 12/28/2017 1006   RBC 4.04 08/27/2016 0505   HGB 12.7 12/28/2017 1006   HCT 39.2  12/28/2017 1006   PLT 247 08/27/2016 0505   MCV 80 12/28/2017 1006   MCH 25.9 (L) 12/28/2017 1006   MCH 26.2 08/27/2016 0505   MCHC 32.4 12/28/2017 1006  MCHC 32.2 08/27/2016 0505   RDW 15.9 (H) 12/28/2017 1006   LYMPHSABS 0.9 12/28/2017 1006   EOSABS 0.2 12/28/2017 1006   BASOSABS 0.0 12/28/2017 1006   Iron/TIBC/Ferritin/ %Sat No results found for: IRON, TIBC, FERRITIN, IRONPCTSAT Lipid Panel     Component Value Date/Time   CHOL 189 07/25/2019 0844   TRIG 119 07/25/2019 0844   HDL 53 07/25/2019 0844   LDLCALC 115 (H) 07/25/2019 0844   Hepatic Function Panel     Component Value Date/Time   PROT 6.4 07/25/2019 0844   ALBUMIN 3.8 07/25/2019 0844   AST 22 07/25/2019 0844   ALT 15 07/25/2019 0844   ALKPHOS 88 07/25/2019 0844   BILITOT 0.4 07/25/2019 0844      Component Value Date/Time   TSH 2.410 04/11/2019 1314   TSH 3.130 12/28/2017 1006      OBESITY BEHAVIORAL INTERVENTION VISIT  Today's visit was # 28   Starting weight: 261 lbs Starting date: 12/28/17 Today's weight : 252 lbs Today's date: 08/18/2019 Total lbs lost to date: 9    ASK: We discussed the diagnosis of obesity with [name] today and Jaleesa agreed to give [name] permission to discuss obesity behavioral modification therapy today.  ASSESS: Elaiya has the diagnosis of obesity and her BMI today is 47.64 Laqueta is in the action stage of change   ADVISE: Masae was educated on the multiple health risks of obesity as well as the benefit of weight loss to improve her health. She was advised of the need for long term treatment and the importance of lifestyle modifications to improve her current health and to decrease her risk of future health problems.  AGREE: Multiple dietary modification options and treatment options were discussed and  Adara agreed to follow the recommendations documented in the above note.  ARRANGE: Miquel was educated on the importance of frequent visits to treat obesity as  outlined per CMS and USPSTF guidelines and agreed to schedule her next follow up appointment today.  I, Trixie Dredge, am acting as transcriptionist for Dennard Nip, MD I have reviewed the above documentation for accuracy and completeness, and I agree with the above. -Dennard Nip, MD

## 2019-08-24 DIAGNOSIS — Z6841 Body Mass Index (BMI) 40.0 and over, adult: Secondary | ICD-10-CM | POA: Diagnosis not present

## 2019-08-24 DIAGNOSIS — M1712 Unilateral primary osteoarthritis, left knee: Secondary | ICD-10-CM | POA: Diagnosis not present

## 2019-08-24 DIAGNOSIS — I10 Essential (primary) hypertension: Secondary | ICD-10-CM | POA: Diagnosis not present

## 2019-08-30 DIAGNOSIS — F4320 Adjustment disorder, unspecified: Secondary | ICD-10-CM | POA: Diagnosis not present

## 2019-08-31 DIAGNOSIS — F4320 Adjustment disorder, unspecified: Secondary | ICD-10-CM | POA: Diagnosis not present

## 2019-08-31 DIAGNOSIS — Z01818 Encounter for other preprocedural examination: Secondary | ICD-10-CM | POA: Diagnosis not present

## 2019-09-06 DIAGNOSIS — Z713 Dietary counseling and surveillance: Secondary | ICD-10-CM | POA: Diagnosis not present

## 2019-09-08 DIAGNOSIS — Z9884 Bariatric surgery status: Secondary | ICD-10-CM | POA: Diagnosis not present

## 2019-09-08 DIAGNOSIS — K219 Gastro-esophageal reflux disease without esophagitis: Secondary | ICD-10-CM | POA: Diagnosis not present

## 2019-09-10 ENCOUNTER — Other Ambulatory Visit (INDEPENDENT_AMBULATORY_CARE_PROVIDER_SITE_OTHER): Payer: Self-pay | Admitting: Family Medicine

## 2019-09-10 DIAGNOSIS — E559 Vitamin D deficiency, unspecified: Secondary | ICD-10-CM

## 2019-09-19 ENCOUNTER — Other Ambulatory Visit (INDEPENDENT_AMBULATORY_CARE_PROVIDER_SITE_OTHER): Payer: Self-pay | Admitting: Family Medicine

## 2019-09-19 DIAGNOSIS — R7303 Prediabetes: Secondary | ICD-10-CM

## 2019-09-19 DIAGNOSIS — F3289 Other specified depressive episodes: Secondary | ICD-10-CM

## 2019-09-20 ENCOUNTER — Other Ambulatory Visit: Payer: Self-pay

## 2019-09-20 ENCOUNTER — Encounter (INDEPENDENT_AMBULATORY_CARE_PROVIDER_SITE_OTHER): Payer: Self-pay | Admitting: Family Medicine

## 2019-09-20 ENCOUNTER — Ambulatory Visit (INDEPENDENT_AMBULATORY_CARE_PROVIDER_SITE_OTHER): Payer: BC Managed Care – PPO | Admitting: Family Medicine

## 2019-09-20 VITALS — BP 105/72 | HR 84 | Temp 98.0°F | Ht 61.0 in | Wt 254.0 lb

## 2019-09-20 DIAGNOSIS — F3289 Other specified depressive episodes: Secondary | ICD-10-CM | POA: Diagnosis not present

## 2019-09-20 DIAGNOSIS — E559 Vitamin D deficiency, unspecified: Secondary | ICD-10-CM | POA: Diagnosis not present

## 2019-09-20 DIAGNOSIS — Z9189 Other specified personal risk factors, not elsewhere classified: Secondary | ICD-10-CM

## 2019-09-20 DIAGNOSIS — Z6841 Body Mass Index (BMI) 40.0 and over, adult: Secondary | ICD-10-CM

## 2019-09-20 MED ORDER — VITAMIN D (ERGOCALCIFEROL) 1.25 MG (50000 UNIT) PO CAPS: 50000.0000 [IU] | ORAL_CAPSULE | ORAL | 0 refills | Status: DC

## 2019-09-20 MED ORDER — BUPROPION HCL ER (SR) 200 MG PO TB12: 200.0000 mg | ORAL_TABLET | Freq: Every day | ORAL | 0 refills | Status: DC

## 2019-09-20 MED ORDER — TOPIRAMATE 50 MG PO TABS: 50.0000 mg | ORAL_TABLET | Freq: Every day | ORAL | 0 refills | Status: DC

## 2019-09-20 NOTE — Progress Notes (Signed)
Office: [phone]  /  Fax: [phone]   HPI:  Chief Complaint: OBESITY [name] is here to discuss her progress with her obesity treatment plan. She is on the Category 2 plan and states she is following her eating plan approximately 50% of the time. She states she is walking/weights 20 minutes 3 times per week.  [name] continues to work on weight loss, but struggles with meal planning and skipping meals occasionally, especially while working.  Today's visit was #29 Starting weight: 261 lbs Starting date: 12/28/2017 Today's weight: 254 lbs  Today's date: 09/20/2019 Total lbs lost to date: 7 Total lbs lost since last in-office visit: 0   Vitamin D deficiency [name] has a diagnosis of Vitamin D deficiency and is stable on prescription Vitamin D. Vitamin D level is not yet at goal. No nausea, vomiting, or muscle weakness.  At risk for osteopenia and osteoporosis [name] is at higher risk of osteopenia and osteoporosis due to Vitamin D deficiency.   Emotional Eating [name] is doing well on her medications and reports no regular issues with insomnia. Blood pressure is stable.  ASSESSMENT AND PLAN:  Vitamin D deficiency - Plan: Vitamin D, Ergocalciferol, (DRISDOL) 1.25 MG (50000 UT) CAPS capsule  Other depression - with emotional eating - Plan: buPROPion (WELLBUTRIN SR) 200 MG 12 hr tablet, topiramate (TOPAMAX) 50 MG tablet  At risk for osteoporosis  Class 3 severe obesity with serious comorbidity and body mass index (BMI) of 45.0 to 49.9 in adult, unspecified obesity type ([place])  PLAN:  Vitamin D Deficiency Aftin was informed that low Vitamin D levels contributes to fatigue and are associated with obesity, breast, and colon cancer. She agrees to continue to take prescription Vit D @ 50,000 IU every week and will follow-up for routine testing of Vitamin D, at least 2-3 times per year. She was informed of the risk of over-replacement of Vitamin D and agrees to not increase her  dose unless she discusses this with [name] first. [name] agrees to follow-up with our clinic in 3-4 weeks.  At risk for osteopenia and osteoporosis [name] was given (~15 minutes) osteoporosis prevention counseling today. [name] is at risk for osteopenia and osteoporosis due to her Vitamin D deficiency. She was encouraged to take her Vitamin D and follow her higher calcium diet and increase strengthening exercise to help strengthen her bones and decrease her risk of osteopenia and osteoporosis.  Emotional Eating [name] was given refills on her Wellbutrin and Topamax. She will continue diet and weight loss.  Obesity [name] is currently in the action stage of change. As such, her goal is to continue with weight loss efforts. She has agreed to follow the Category 2 plan. [name] has been instructed to work up to a goal of 150 minutes of combined cardio and strengthening exercise per week for weight loss and overall health benefits. We discussed the following Behavioral Modification Strategies today: no skipping meals, emotional eating strategies, holiday eating strategies, and celebration eating strategies. [name] has agreed to follow-up with our clinic in 3-4 weeks. She was informed of the importance of frequent follow-up visits to maximize her success with intensive lifestyle modifications for her multiple health conditions.  ALLERGIES: Allergies  Allergen Reactions  . No Known Allergies     MEDICATIONS: Current Outpatient Medications on File Prior to Visit  Medication Sig Dispense Refill  . acetaminophen (TYLENOL) 325 MG tablet Take 2 tablets (650 mg total) by mouth every 6 (six) hours as needed for mild pain (or Fever >/=  101).    . amoxicillin (AMOXIL) 500 MG capsule Take 500 mg by mouth. Take 4 capsule by mouth prior to dental procedure    . aspirin EC 81 MG tablet Take 81 mg by mouth daily.    [name] Kitchen aspirin-acetaminophen-caffeine (EXCEDRIN MIGRAINE) 250-250-65 MG tablet Take by mouth every 6  (six) hours as needed for headache.    . Calcium 600-200 MG-UNIT tablet Take 1 tablet by mouth daily.    . cyclobenzaprine (FLEXERIL) 10 MG tablet Take 10 mg by mouth 3 (three) times daily as needed for muscle spasms.    . meloxicam (MOBIC) 15 MG tablet Take 15 mg by mouth daily.    . metFORMIN (GLUCOPHAGE) 500 MG tablet Take 1 tablet (500 mg total) by mouth daily with breakfast. 30 tablet 0  . Multiple Vitamins-Minerals (MULTIVITAMIN WITH MINERALS) tablet Take 1 tablet by mouth daily.    . naproxen sodium (ALEVE) 220 MG tablet Take 220 mg by mouth daily as needed.    . triamterene-hydrochlorothiazide (MAXZIDE-25) 37.5-25 MG per tablet Take 1 tablet by mouth Daily.     No current facility-administered medications on file prior to visit.    PAST MEDICAL HISTORY: Past Medical History:  Diagnosis Date  . Arthritis   . Back pain   . 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 Hypertension   . Joint pain   . Leg edema   . Obesity    S/P lap band 2010  . Vitamin D deficiency     PAST SURGICAL HISTORY: Past Surgical History:  Procedure Laterality Date  . ABDOMINAL HYSTERECTOMY     s/p endometrial adenocarcinoma  . LAPAROSCOPIC GASTRIC BANDING  04/17/2009  . TOTAL KNEE ARTHROPLASTY Left 08/25/2016   Procedure: TOTAL KNEE ARTHROPLASTY;  Surgeon: Elsie Saas, MD;  Location: [facility];  Service: Orthopedics;  Laterality: Left;    SOCIAL HISTORY: Social History   Tobacco Use  . Smoking status: Never Smoker  . Smokeless tobacco: Never Used  Substance Use Topics  . Alcohol use: No  . Drug use: No    FAMILY HISTORY: Family History  Problem Relation Age of Onset  . Hypertension Mother   . Kidney disease Mother   . Hypertension Sister   . Hypertension Maternal Grandmother    ROS: Review of Systems  Constitutional: Negative for weight loss.  Gastrointestinal: Negative for nausea and vomiting.  Musculoskeletal:       Negative for muscle weakness.   PHYSICAL EXAM: Blood  pressure 105/72, pulse 84, temperature 98 F (36.7 C), temperature source Oral, height 5\' 1"  (1.549 m), weight 254 lb (115.2 kg), SpO2 99 %. Body mass index is 47.99 kg/m. Physical Exam Vitals reviewed.  Constitutional:      Appearance: Normal appearance. She is obese.  Cardiovascular:     Rate and Rhythm: Normal rate.     Pulses: Normal pulses.  Pulmonary:     Effort: Pulmonary effort is normal.     Breath sounds: Normal breath sounds.  Musculoskeletal:        General: Normal range of motion.  Skin:    General: Skin is warm and dry.  Neurological:     Mental Status: She is alert and oriented to person, place, and time.  Psychiatric:        Behavior: Behavior normal.   RECENT LABS AND TESTS: BMET    Component Value Date/Time   NA 142 07/25/2019 0844   K 4.1 07/25/2019 0844   CL 107 (H) 07/25/2019 0844   CO2 23 07/25/2019  0844   GLUCOSE 90 07/25/2019 0844   GLUCOSE 125 (H) 08/27/2016 0505   BUN 18 07/25/2019 0844   CREATININE 1.07 (H) 07/25/2019 0844   CALCIUM 9.8 07/25/2019 0844   GFRNONAA 59 (L) 07/25/2019 0844   GFRAA 68 07/25/2019 0844   Lab Results  Component Value Date   HGBA1C 5.7 (H) 07/25/2019   HGBA1C 5.7 (H) 04/11/2019   HGBA1C 5.6 08/10/2018   HGBA1C 5.6 05/18/2018   HGBA1C 5.7 (H) 12/28/2017   Lab Results  Component Value Date   INSULIN 16.3 07/25/2019   INSULIN 10.4 04/11/2019   INSULIN 7.7 08/10/2018   INSULIN 14.4 05/18/2018   INSULIN 14.0 12/28/2017   CBC    Component Value Date/Time   WBC 5.6 12/28/2017 1006   WBC 14.6 (H) 08/27/2016 0505   RBC 4.91 12/28/2017 1006   RBC 4.04 08/27/2016 0505   HGB 12.7 12/28/2017 1006   HCT 39.2 12/28/2017 1006   PLT 247 08/27/2016 0505   MCV 80 12/28/2017 1006   MCH 25.9 (L) 12/28/2017 1006   MCH 26.2 08/27/2016 0505   MCHC 32.4 12/28/2017 1006   MCHC 32.2 08/27/2016 0505   RDW 15.9 (H) 12/28/2017 1006   LYMPHSABS 0.9 12/28/2017 1006   EOSABS 0.2 12/28/2017 1006   BASOSABS 0.0 12/28/2017 1006     Iron/TIBC/Ferritin/ %Sat No results found for: IRON, TIBC, FERRITIN, IRONPCTSAT Lipid Panel     Component Value Date/Time   CHOL 189 07/25/2019 0844   TRIG 119 07/25/2019 0844   HDL 53 07/25/2019 0844   LDLCALC 115 (H) 07/25/2019 0844   Hepatic Function Panel     Component Value Date/Time   PROT 6.4 07/25/2019 0844   ALBUMIN 3.8 07/25/2019 0844   AST 22 07/25/2019 0844   ALT 15 07/25/2019 0844   ALKPHOS 88 07/25/2019 0844   BILITOT 0.4 07/25/2019 0844      Component Value Date/Time   TSH 2.410 04/11/2019 1314   TSH 3.130 12/28/2017 1006      I, Michaelene Song, am acting as Location manager for Dennard Nip, MD I have reviewed the above documentation for accuracy and completeness, and I agree with the above. -Dennard Nip, MD

## 2019-09-21 DIAGNOSIS — E119 Type 2 diabetes mellitus without complications: Secondary | ICD-10-CM | POA: Diagnosis not present

## 2019-10-04 DIAGNOSIS — F4320 Adjustment disorder, unspecified: Secondary | ICD-10-CM | POA: Diagnosis not present

## 2019-10-07 DIAGNOSIS — Z713 Dietary counseling and surveillance: Secondary | ICD-10-CM | POA: Diagnosis not present

## 2019-10-13 DIAGNOSIS — K9509 Other complications of gastric band procedure: Secondary | ICD-10-CM | POA: Diagnosis not present

## 2019-10-13 DIAGNOSIS — Z01818 Encounter for other preprocedural examination: Secondary | ICD-10-CM | POA: Diagnosis not present

## 2019-10-14 ENCOUNTER — Other Ambulatory Visit (INDEPENDENT_AMBULATORY_CARE_PROVIDER_SITE_OTHER): Payer: Self-pay | Admitting: Family Medicine

## 2019-10-14 DIAGNOSIS — E559 Vitamin D deficiency, unspecified: Secondary | ICD-10-CM

## 2019-10-16 ENCOUNTER — Other Ambulatory Visit (INDEPENDENT_AMBULATORY_CARE_PROVIDER_SITE_OTHER): Payer: Self-pay | Admitting: Family Medicine

## 2019-10-16 DIAGNOSIS — F3289 Other specified depressive episodes: Secondary | ICD-10-CM

## 2019-10-18 ENCOUNTER — Ambulatory Visit (INDEPENDENT_AMBULATORY_CARE_PROVIDER_SITE_OTHER): Payer: BC Managed Care – PPO | Admitting: Family Medicine

## 2019-10-18 ENCOUNTER — Encounter (INDEPENDENT_AMBULATORY_CARE_PROVIDER_SITE_OTHER): Payer: Self-pay | Admitting: Family Medicine

## 2019-10-18 ENCOUNTER — Other Ambulatory Visit: Payer: Self-pay

## 2019-10-18 VITALS — BP 124/81 | HR 80 | Temp 98.0°F | Ht 61.0 in | Wt 252.0 lb

## 2019-10-18 DIAGNOSIS — Z6841 Body Mass Index (BMI) 40.0 and over, adult: Secondary | ICD-10-CM

## 2019-10-18 DIAGNOSIS — F3289 Other specified depressive episodes: Secondary | ICD-10-CM

## 2019-10-18 DIAGNOSIS — R7303 Prediabetes: Secondary | ICD-10-CM

## 2019-10-18 DIAGNOSIS — E559 Vitamin D deficiency, unspecified: Secondary | ICD-10-CM

## 2019-10-18 DIAGNOSIS — Z9189 Other specified personal risk factors, not elsewhere classified: Secondary | ICD-10-CM

## 2019-10-18 MED ORDER — TOPIRAMATE 50 MG PO TABS: 50.0000 mg | ORAL_TABLET | Freq: Every day | ORAL | 0 refills | Status: DC

## 2019-10-18 MED ORDER — BUPROPION HCL ER (SR) 200 MG PO TB12: 200.0000 mg | ORAL_TABLET | Freq: Every day | ORAL | 0 refills | Status: DC

## 2019-10-18 MED ORDER — METFORMIN HCL 500 MG PO TABS: 500.0000 mg | ORAL_TABLET | Freq: Every day | ORAL | 0 refills | Status: DC

## 2019-10-18 MED ORDER — VITAMIN D (ERGOCALCIFEROL) 1.25 MG (50000 UNIT) PO CAPS: 50000.0000 [IU] | ORAL_CAPSULE | ORAL | 0 refills | Status: DC

## 2019-10-19 DIAGNOSIS — Z01818 Encounter for other preprocedural examination: Secondary | ICD-10-CM | POA: Diagnosis not present

## 2019-10-19 DIAGNOSIS — Z20822 Contact with and (suspected) exposure to covid-19: Secondary | ICD-10-CM | POA: Diagnosis not present

## 2019-10-19 NOTE — Progress Notes (Signed)
Chief Complaint:   OBESITY [name] is here to discuss her progress with her obesity treatment plan along with follow-up of her obesity related diagnoses. [name] is on the Category 2 Plan and states she is following her eating plan approximately 50% of the time. [name] states she is walking for 30 minutes 3 times per week, and walking 6,000 steps 5 days a week.  Today's visit was #: 30 Starting weight: 261 lbs Starting date: 12/28/17 Today's weight: 252 lbs Today's date: 10/18/2019 Total lbs lost to date: 9 Total lbs lost since last in-office visit: 2  Interim History: [name] did well avoiding weight gain over the holidays, and has even lost 2 lbs. She is scheduled to have her lap band removed and then she is considering having bariatric SADI surgery at [facility].  Subjective:   1. Pre-diabetes [name] is doing well on her diet prescription, and she is tole[rating metformin well. She requests a refill today.  2. Vitamin D deficiency [name] is stable on Vit D and she requests a refill today.  3. Other depression [name] is stable on her medications and she is feeling hopeful about her upcoming bariatric surgery. No side effects noted.  4. At increased risk of exposure to COVID-19 virus [name] is at risk of COVID19 due to [name]. She is considering having the COVID19 vaccine when she is eligible.  Assessment/Plan:   1. Pre-diabetes [name] will continue to work on weight loss, exercise, and decreasing simple carbohydrates to help decrease the risk of diabetes. We will refill metformin for 1 month, and will continue follow up.  - metFORMIN (GLUCOPHAGE) 500 MG tablet; Take 1 tablet (500 mg total) by mouth daily with breakfast.  Dispense: 30 tablet; Refill: 0  2. Vitamin D deficiency Low Vitamin D level contributes to fatigue and are associated with obesity, breast, and colon cancer. We will refill prescription Vitamin D for 1 month. [name] will follow-up for routine testing  of Vitamin D, at least 2-3 times per year to avoid over-replacement. We will continue to monitor.  - Vitamin D, Ergocalciferol, (DRISDOL) 1.25 MG (50000 UNIT) CAPS capsule; Take 1 capsule (50,000 Units total) by mouth every 7 (seven) days.  Dispense: 4 capsule; Refill: 0  3. Other depression Behavior modification techniques were discussed today to help [name] deal with her emotional/non-hunger eating behaviors. We will refill Wellbutrin and Topamax for 1 month. Orders and follow up as documented in patient record.   - buPROPion (WELLBUTRIN SR) 200 MG 12 hr tablet; Take 1 tablet (200 mg total) by mouth daily.  Dispense: 30 tablet; Refill: 0 - topiramate (TOPAMAX) 50 MG tablet; Take 1 tablet (50 mg total) by mouth daily.  Dispense: 30 tablet; Refill: 0  4. At increased risk of exposure to COVID-19 virus [name] was given approximately 30 minutes of [place] education and counseling today. [name] was educated on COVID19 infection and various vaccines including risks and side effects, and how to find out when and where to get the vaccine.  Repetitive spaced learning was employed today to elicit superior memory formation and behavioral change.  5. Class 3 severe obesity with serious comorbidity and body mass index (BMI) of 45.0 to 49.9 in adult, unspecified obesity type (HCC) [name] is currently in the action stage of change. As such, her goal is to continue with weight loss efforts. She has agreed to keeping a food journal and adhering to recommended goals of 1200 calories and 80+ grams of protein daily.   Behavioral  modification strategies: increasing lean protein intake and meal planning and cooking strategies.  [name] has agreed to follow-up with our clinic in 4 weeks. She was informed of the importance of frequent follow-up visits to maximize her success with intensive lifestyle modifications for her multiple health conditions.   Objective:   Blood pressure 124/81, pulse 80, temperature 98 F  (36.7 C), temperature source Oral, height 5\' 1"  (1.549 m), weight 252 lb (114.3 kg), SpO2 99 %. Body mass index is 47.61 kg/m.  General: Cooperative, alert, well developed, in no acute distress. HEENT: Conjunctivae and lids unremarkable. Cardiovascular: Regular rhythm.  Lungs: Normal work of breathing. Neurologic: No focal deficits.   Lab Results  Component Value Date   CREATININE 1.07 (H) 07/25/2019   BUN 18 07/25/2019   NA 142 07/25/2019   K 4.1 07/25/2019   CL 107 (H) 07/25/2019   CO2 23 07/25/2019   Lab Results  Component Value Date   ALT 15 07/25/2019   AST 22 07/25/2019   ALKPHOS 88 07/25/2019   BILITOT 0.4 07/25/2019   Lab Results  Component Value Date   HGBA1C 5.7 (H) 07/25/2019   HGBA1C 5.7 (H) 04/11/2019   HGBA1C 5.6 08/10/2018   HGBA1C 5.6 05/18/2018   HGBA1C 5.7 (H) 12/28/2017   Lab Results  Component Value Date   INSULIN 16.3 07/25/2019   INSULIN 10.4 04/11/2019   INSULIN 7.7 08/10/2018   INSULIN 14.4 05/18/2018   INSULIN 14.0 12/28/2017   Lab Results  Component Value Date   TSH 2.410 04/11/2019   Lab Results  Component Value Date   CHOL 189 07/25/2019   HDL 53 07/25/2019   LDLCALC 115 (H) 07/25/2019   TRIG 119 07/25/2019   Lab Results  Component Value Date   WBC 5.6 12/28/2017   HGB 12.7 12/28/2017   HCT 39.2 12/28/2017   MCV 80 12/28/2017   PLT 247 08/27/2016   No results found for: IRON, TIBC, FERRITIN  Attestation Statements:   Reviewed by clinician on day of visit: allergies, medications, problem list, medical history, surgical history, family history, social history, and previous encounter notes.   I, Trixie Dredge, am acting as transcriptionist for Dennard Nip, MD.  I have reviewed the above documentation for accuracy and completeness, and I agree with the above. -  Dennard Nip, MD

## 2019-10-24 DIAGNOSIS — K449 Diaphragmatic hernia without obstruction or gangrene: Secondary | ICD-10-CM | POA: Diagnosis not present

## 2019-10-24 DIAGNOSIS — F329 Major depressive disorder, single episode, unspecified: Secondary | ICD-10-CM | POA: Diagnosis not present

## 2019-10-24 DIAGNOSIS — I10 Essential (primary) hypertension: Secondary | ICD-10-CM | POA: Diagnosis not present

## 2019-10-24 DIAGNOSIS — M1710 Unilateral primary osteoarthritis, unspecified knee: Secondary | ICD-10-CM | POA: Diagnosis not present

## 2019-10-24 DIAGNOSIS — Z9884 Bariatric surgery status: Secondary | ICD-10-CM | POA: Diagnosis not present

## 2019-10-24 DIAGNOSIS — Z791 Long term (current) use of non-steroidal anti-inflammatories (NSAID): Secondary | ICD-10-CM | POA: Diagnosis not present

## 2019-10-24 DIAGNOSIS — Z7984 Long term (current) use of oral hypoglycemic drugs: Secondary | ICD-10-CM | POA: Diagnosis not present

## 2019-10-24 DIAGNOSIS — Z7982 Long term (current) use of aspirin: Secondary | ICD-10-CM | POA: Diagnosis not present

## 2019-10-24 DIAGNOSIS — Z4651 Encounter for fitting and adjustment of gastric lap band: Secondary | ICD-10-CM | POA: Diagnosis not present

## 2019-10-24 DIAGNOSIS — R1310 Dysphagia, unspecified: Secondary | ICD-10-CM | POA: Diagnosis not present

## 2019-10-24 DIAGNOSIS — R7303 Prediabetes: Secondary | ICD-10-CM | POA: Diagnosis not present

## 2019-10-24 DIAGNOSIS — R1110 Vomiting, unspecified: Secondary | ICD-10-CM | POA: Diagnosis not present

## 2019-10-24 DIAGNOSIS — Z8542 Personal history of malignant neoplasm of other parts of uterus: Secondary | ICD-10-CM | POA: Diagnosis not present

## 2019-10-24 DIAGNOSIS — Z79899 Other long term (current) drug therapy: Secondary | ICD-10-CM | POA: Diagnosis not present

## 2019-10-24 DIAGNOSIS — K9509 Other complications of gastric band procedure: Secondary | ICD-10-CM | POA: Diagnosis not present

## 2019-11-08 ENCOUNTER — Other Ambulatory Visit (INDEPENDENT_AMBULATORY_CARE_PROVIDER_SITE_OTHER): Payer: Self-pay | Admitting: Family Medicine

## 2019-11-08 DIAGNOSIS — E559 Vitamin D deficiency, unspecified: Secondary | ICD-10-CM

## 2019-11-10 ENCOUNTER — Other Ambulatory Visit (INDEPENDENT_AMBULATORY_CARE_PROVIDER_SITE_OTHER): Payer: Self-pay | Admitting: Family Medicine

## 2019-11-10 DIAGNOSIS — F3289 Other specified depressive episodes: Secondary | ICD-10-CM

## 2019-11-10 DIAGNOSIS — R7303 Prediabetes: Secondary | ICD-10-CM

## 2019-11-14 ENCOUNTER — Other Ambulatory Visit (INDEPENDENT_AMBULATORY_CARE_PROVIDER_SITE_OTHER): Payer: Self-pay | Admitting: Family Medicine

## 2019-11-14 DIAGNOSIS — F3289 Other specified depressive episodes: Secondary | ICD-10-CM

## 2019-11-24 ENCOUNTER — Other Ambulatory Visit: Payer: Self-pay

## 2019-11-24 ENCOUNTER — Encounter (INDEPENDENT_AMBULATORY_CARE_PROVIDER_SITE_OTHER): Payer: Self-pay | Admitting: Family Medicine

## 2019-11-24 ENCOUNTER — Ambulatory Visit (INDEPENDENT_AMBULATORY_CARE_PROVIDER_SITE_OTHER): Payer: BC Managed Care – PPO | Admitting: Family Medicine

## 2019-11-24 VITALS — HR 79 | Temp 98.4°F | Ht 61.0 in | Wt 253.0 lb

## 2019-11-24 DIAGNOSIS — Z13820 Encounter for screening for osteoporosis: Secondary | ICD-10-CM | POA: Diagnosis not present

## 2019-11-24 DIAGNOSIS — Z6841 Body Mass Index (BMI) 40.0 and over, adult: Secondary | ICD-10-CM | POA: Diagnosis not present

## 2019-11-24 DIAGNOSIS — E559 Vitamin D deficiency, unspecified: Secondary | ICD-10-CM | POA: Diagnosis not present

## 2019-11-24 DIAGNOSIS — R7303 Prediabetes: Secondary | ICD-10-CM

## 2019-11-24 DIAGNOSIS — Z1231 Encounter for screening mammogram for malignant neoplasm of breast: Secondary | ICD-10-CM | POA: Diagnosis not present

## 2019-11-24 DIAGNOSIS — Z01419 Encounter for gynecological examination (general) (routine) without abnormal findings: Secondary | ICD-10-CM | POA: Diagnosis not present

## 2019-11-24 DIAGNOSIS — Z1211 Encounter for screening for malignant neoplasm of colon: Secondary | ICD-10-CM | POA: Diagnosis not present

## 2019-11-24 NOTE — Progress Notes (Signed)
Chief Complaint:   OBESITY [name] is here to discuss her progress with her obesity treatment plan along with follow-up of her obesity related diagnoses. [name] is on keeping a food journal and adhering to recommended goals of 1200 calories and 80+ grams of protein daily and states she is following her eating plan approximately 50% of the time. [name] states she is walking 5,500 steps daily.  Today's visit was #: 22 Starting weight: 261 lbs Starting date: 12/28/17 Today's weight: 253 lbs Today's date: 11/24/2019 Total lbs lost to date: 8 Total lbs lost since last in-office visit: 0  Interim History: [name] has maintained her weight since her last visit. She has her lap band removed without  incident and she is getting ready for another weight loss surgery.  Subjective:   1. Pre-diabetes [name] is stable on metformin and she requests a refill today.  2. Vitamin D deficiency [name] is stable on Vit D and she denies nausea or vomiting. She requests a refill today.  Assessment/Plan:   1. Pre-diabetes [name] will continue to work on weight loss, diet, exercise, and decreasing simple carbohydrates to help decrease the risk of diabetes. We will refill metformin for 1 month.  - metFORMIN (GLUCOPHAGE) 500 MG tablet; Take 1 tablet (500 mg total) by mouth daily with breakfast.  Dispense: 30 tablet; Refill: 0  2. Vitamin D deficiency Low Vitamin D level contributes to fatigue and are associated with obesity, breast, and colon cancer. We will refill prescription Vitamin D for 1 month. [name] will follow-up for routine testing of Vitamin D, at least 2-3 times per year to avoid over-replacement.  - Vitamin D, Ergocalciferol, (DRISDOL) 1.25 MG (50000 UNIT) CAPS capsule; Take 1 capsule (50,000 Units total) by mouth every 7 (seven) days.  Dispense: 4 capsule; Refill: 0  3. Class 3 severe obesity with serious comorbidity and body mass index (BMI) of 45.0 to 49.9 in adult, unspecified obesity type  (HCC) [name] is currently in the action stage of change. As such, her goal is to continue with weight loss efforts. She has agreed to the Category 2 Plan.   We discussed the pros and cons of various weight loss surgeries. [name] has some good options and plans to discuss this further with her bariatric surgeon.  Exercise goals: [name] is to continue her current exercise regimen as is.  Behavioral modification strategies: meal planning and cooking strategies.  [name] has agreed to follow-up with our clinic in 3 to 4 weeks. She was informed of the importance of frequent follow-up visits to maximize her success with intensive lifestyle modifications for her multiple health conditions.   Objective:   Pulse 79, temperature 98.4 F (36.9 C), temperature source Oral, height 5\' 1"  (1.549 m), weight 253 lb (114.8 kg), SpO2 99 %. Body mass index is 47.8 kg/m.  General: Cooperative, alert, well developed, in no acute distress. HEENT: Conjunctivae and lids unremarkable. Cardiovascular: Regular rhythm.  Lungs: Normal work of breathing. Neurologic: No focal deficits.   Lab Results  Component Value Date   CREATININE 1.07 (H) 07/25/2019   BUN 18 07/25/2019   NA 142 07/25/2019   K 4.1 07/25/2019   CL 107 (H) 07/25/2019   CO2 23 07/25/2019   Lab Results  Component Value Date   ALT 15 07/25/2019   AST 22 07/25/2019   ALKPHOS 88 07/25/2019   BILITOT 0.4 07/25/2019   Lab Results  Component Value Date   HGBA1C 5.7 (H) 07/25/2019   HGBA1C 5.7 (H) 04/11/2019  HGBA1C 5.6 08/10/2018   HGBA1C 5.6 05/18/2018   HGBA1C 5.7 (H) 12/28/2017   Lab Results  Component Value Date   INSULIN 16.3 07/25/2019   INSULIN 10.4 04/11/2019   INSULIN 7.7 08/10/2018   INSULIN 14.4 05/18/2018   INSULIN 14.0 12/28/2017   Lab Results  Component Value Date   TSH 2.410 04/11/2019   Lab Results  Component Value Date   CHOL 189 07/25/2019   HDL 53 07/25/2019   LDLCALC 115 (H) 07/25/2019   TRIG 119  07/25/2019   Lab Results  Component Value Date   WBC 5.6 12/28/2017   HGB 12.7 12/28/2017   HCT 39.2 12/28/2017   MCV 80 12/28/2017   PLT 247 08/27/2016   No results found for: IRON, TIBC, FERRITIN  Attestation Statements:   Reviewed by clinician on day of visit: allergies, medications, problem list, medical history, surgical history, family history, social history, and previous encounter notes.   I, Trixie Dredge, am acting as transcriptionist for Dennard Nip, MD.  I have reviewed the above documentation for accuracy and completeness, and I agree with the above. -  Dennard Nip, MD

## 2019-11-28 MED ORDER — METFORMIN HCL 500 MG PO TABS: 500.0000 mg | ORAL_TABLET | Freq: Every day | ORAL | 0 refills | Status: DC

## 2019-11-28 MED ORDER — VITAMIN D (ERGOCALCIFEROL) 1.25 MG (50000 UNIT) PO CAPS: 50000.0000 [IU] | ORAL_CAPSULE | ORAL | 0 refills | Status: DC

## 2019-12-19 DIAGNOSIS — R928 Other abnormal and inconclusive findings on diagnostic imaging of breast: Secondary | ICD-10-CM | POA: Diagnosis not present

## 2019-12-20 ENCOUNTER — Other Ambulatory Visit (INDEPENDENT_AMBULATORY_CARE_PROVIDER_SITE_OTHER): Payer: Self-pay | Admitting: Family Medicine

## 2019-12-20 DIAGNOSIS — E559 Vitamin D deficiency, unspecified: Secondary | ICD-10-CM

## 2019-12-22 ENCOUNTER — Other Ambulatory Visit (INDEPENDENT_AMBULATORY_CARE_PROVIDER_SITE_OTHER): Payer: Self-pay | Admitting: Family Medicine

## 2019-12-22 DIAGNOSIS — R7303 Prediabetes: Secondary | ICD-10-CM

## 2019-12-27 ENCOUNTER — Encounter (INDEPENDENT_AMBULATORY_CARE_PROVIDER_SITE_OTHER): Payer: Self-pay | Admitting: Family Medicine

## 2019-12-27 ENCOUNTER — Ambulatory Visit (INDEPENDENT_AMBULATORY_CARE_PROVIDER_SITE_OTHER): Payer: BC Managed Care – PPO | Admitting: Family Medicine

## 2019-12-27 ENCOUNTER — Other Ambulatory Visit: Payer: Self-pay

## 2019-12-27 VITALS — BP 126/87 | HR 81 | Temp 98.1°F | Ht 61.0 in | Wt 258.0 lb

## 2019-12-27 DIAGNOSIS — E559 Vitamin D deficiency, unspecified: Secondary | ICD-10-CM

## 2019-12-27 DIAGNOSIS — F3289 Other specified depressive episodes: Secondary | ICD-10-CM | POA: Diagnosis not present

## 2019-12-27 DIAGNOSIS — Z9189 Other specified personal risk factors, not elsewhere classified: Secondary | ICD-10-CM | POA: Diagnosis not present

## 2019-12-27 DIAGNOSIS — R7303 Prediabetes: Secondary | ICD-10-CM

## 2019-12-27 DIAGNOSIS — Z6841 Body Mass Index (BMI) 40.0 and over, adult: Secondary | ICD-10-CM

## 2019-12-27 MED ORDER — VITAMIN D (ERGOCALCIFEROL) 1.25 MG (50000 UNIT) PO CAPS: 50000.0000 [IU] | ORAL_CAPSULE | ORAL | 0 refills | Status: DC

## 2019-12-27 MED ORDER — BUPROPION HCL ER (SR) 200 MG PO TB12: 200.0000 mg | ORAL_TABLET | Freq: Every day | ORAL | 0 refills | Status: DC

## 2019-12-27 MED ORDER — TOPIRAMATE 50 MG PO TABS: 50.0000 mg | ORAL_TABLET | Freq: Every day | ORAL | 0 refills | Status: DC

## 2019-12-27 MED ORDER — METFORMIN HCL 500 MG PO TABS: 500.0000 mg | ORAL_TABLET | Freq: Every day | ORAL | 0 refills | Status: DC

## 2019-12-28 NOTE — Progress Notes (Signed)
Chief Complaint:   OBESITY [name] is here to discuss her progress with her obesity treatment plan along with follow-up of her obesity related diagnoses. [name] is on the Category 2 Plan and states she is following her eating plan approximately 50% of the time. [name] states she is walking for 20 minutes 3 times per week.  Today's visit was #: 33 Starting weight: 261 lbs Starting date: 12/28/2017 Today's weight: 258 lbs Today's date: 12/27/2019 Total lbs lost to date: 3 Total lbs lost since last in-office visit: 0  Interim History: [name] is status post lap band removal and is ready to schedule for another weight loss surgery. She is trying to increase her protein and is trying different protein drinks as she prepares for the liquid stage after surgery.  Subjective:   1. Pre-diabetes [name] is stable on metformin, and she notes minimal GI upset. She is trying to decrease simple carbohydrates.  2. Vitamin D deficiency [name] is stable on Vit D. She denies nausea, vomiting, or muscle weakness.  3. Other depression, emotional eating [name] is stable on her medications. Her blood pressure is well controlled. She is working on identifying emotional eating behaviors.   4. At risk for malnutrition [name] is at increased risk for malnutrition due to upcoming bariatric surgery.  Assessment/Plan:   1. Pre-diabetes [name] will continue to work on weight loss, exercise, and decreasing simple carbohydrates to help decrease the risk of diabetes. We will refill metformin for 1 month. We will recheck labs at her next visit.  - metFORMIN (GLUCOPHAGE) 500 MG tablet; Take 1 tablet (500 mg total) by mouth daily with breakfast.  Dispense: 30 tablet; Refill: 0  2. Vitamin D deficiency Low Vitamin D level contributes to fatigue and are associated with obesity, breast, and colon cancer. We will refill prescription Vitamin D for 1 month. [name] will follow-up for routine testing of Vitamin D, at  least 2-3 times per year to avoid over-replacement. We will recheck labs at her next visit.  - Vitamin D, Ergocalciferol, (DRISDOL) 1.25 MG (50000 UNIT) CAPS capsule; Take 1 capsule (50,000 Units total) by mouth every 7 (seven) days.  Dispense: 4 capsule; Refill: 0  3. Other depression, emotional eating Behavior modification techniques were discussed today to help [name] deal with her emotional/non-hunger eating behaviors.  We will refill Wellbutrin and Topamax for 1 month. Orders and follow up as documented in patient record.   - buPROPion (WELLBUTRIN SR) 200 MG 12 hr tablet; Take 1 tablet (200 mg total) by mouth daily.  Dispense: 30 tablet; Refill: 0 - topiramate (TOPAMAX) 50 MG tablet; Take 1 tablet (50 mg total) by mouth daily.  Dispense: 30 tablet; Refill: 0  4. At risk for malnutrition [name] was given approximately 15 minutes of counseling today regarding prevention of malnutrition and ways to meet macronutrient goals..   5. Class 3 severe obesity with serious comorbidity and body mass index (BMI) of 45.0 to 49.9 in adult, unspecified obesity type (HCC) [name] is currently in the action stage of change. As such, her goal is to continue with weight loss efforts. She has agreed to keeping a food journal and adhering to recommended goals of 1200 calories and 75+ grams of protein daily.   Exercise goals: As is.  Behavioral modification strategies: increasing lean protein intake and meal planning and cooking strategies.  [name] has agreed to follow-up with our clinic in 3 to 4 weeks. She was informed of the importance of frequent follow-up visits to maximize  her success with intensive lifestyle modifications for her multiple health conditions.   Objective:   Blood pressure 126/87, pulse 81, temperature 98.1 F (36.7 C), temperature source Oral, height 5\' 1"  (1.549 m), weight 258 lb (117 kg), SpO2 99 %. Body mass index is 48.75 kg/m.  General: Cooperative, alert, well developed, in no  acute distress. HEENT: Conjunctivae and lids unremarkable. Cardiovascular: Regular rhythm.  Lungs: Normal work of breathing. Neurologic: No focal deficits.   Lab Results  Component Value Date   CREATININE 1.07 (H) 07/25/2019   BUN 18 07/25/2019   NA 142 07/25/2019   K 4.1 07/25/2019   CL 107 (H) 07/25/2019   CO2 23 07/25/2019   Lab Results  Component Value Date   ALT 15 07/25/2019   AST 22 07/25/2019   ALKPHOS 88 07/25/2019   BILITOT 0.4 07/25/2019   Lab Results  Component Value Date   HGBA1C 5.7 (H) 07/25/2019   HGBA1C 5.7 (H) 04/11/2019   HGBA1C 5.6 08/10/2018   HGBA1C 5.6 05/18/2018   HGBA1C 5.7 (H) 12/28/2017   Lab Results  Component Value Date   INSULIN 16.3 07/25/2019   INSULIN 10.4 04/11/2019   INSULIN 7.7 08/10/2018   INSULIN 14.4 05/18/2018   INSULIN 14.0 12/28/2017   Lab Results  Component Value Date   TSH 2.410 04/11/2019   Lab Results  Component Value Date   CHOL 189 07/25/2019   HDL 53 07/25/2019   LDLCALC 115 (H) 07/25/2019   TRIG 119 07/25/2019   Lab Results  Component Value Date   WBC 5.6 12/28/2017   HGB 12.7 12/28/2017   HCT 39.2 12/28/2017   MCV 80 12/28/2017   PLT 247 08/27/2016   No results found for: IRON, TIBC, FERRITIN  Attestation Statements:   Reviewed by clinician on day of visit: allergies, medications, problem list, medical history, surgical history, family history, social history, and previous encounter notes.   I, Trixie Dredge, am acting as transcriptionist for Dennard Nip, MD.  I have reviewed the above documentation for accuracy and completeness, and I agree with the above. -  Dennard Nip, MD

## 2020-01-12 DIAGNOSIS — K449 Diaphragmatic hernia without obstruction or gangrene: Secondary | ICD-10-CM | POA: Diagnosis not present

## 2020-01-12 DIAGNOSIS — K219 Gastro-esophageal reflux disease without esophagitis: Secondary | ICD-10-CM | POA: Diagnosis not present

## 2020-01-12 DIAGNOSIS — Z9884 Bariatric surgery status: Secondary | ICD-10-CM | POA: Diagnosis not present

## 2020-01-19 ENCOUNTER — Other Ambulatory Visit (INDEPENDENT_AMBULATORY_CARE_PROVIDER_SITE_OTHER): Payer: Self-pay | Admitting: Family Medicine

## 2020-01-19 DIAGNOSIS — E559 Vitamin D deficiency, unspecified: Secondary | ICD-10-CM

## 2020-01-21 ENCOUNTER — Other Ambulatory Visit (INDEPENDENT_AMBULATORY_CARE_PROVIDER_SITE_OTHER): Payer: Self-pay | Admitting: Family Medicine

## 2020-01-21 DIAGNOSIS — F3289 Other specified depressive episodes: Secondary | ICD-10-CM

## 2020-01-21 DIAGNOSIS — R7303 Prediabetes: Secondary | ICD-10-CM

## 2020-01-25 ENCOUNTER — Ambulatory Visit (INDEPENDENT_AMBULATORY_CARE_PROVIDER_SITE_OTHER): Payer: BC Managed Care – PPO | Admitting: Family Medicine

## 2020-01-25 ENCOUNTER — Encounter (INDEPENDENT_AMBULATORY_CARE_PROVIDER_SITE_OTHER): Payer: Self-pay | Admitting: Family Medicine

## 2020-01-25 ENCOUNTER — Other Ambulatory Visit: Payer: Self-pay

## 2020-01-25 VITALS — BP 130/83 | HR 71 | Temp 98.0°F | Ht 61.0 in | Wt 260.0 lb

## 2020-01-25 DIAGNOSIS — R7303 Prediabetes: Secondary | ICD-10-CM | POA: Diagnosis not present

## 2020-01-25 DIAGNOSIS — E559 Vitamin D deficiency, unspecified: Secondary | ICD-10-CM

## 2020-01-25 DIAGNOSIS — Z6841 Body Mass Index (BMI) 40.0 and over, adult: Secondary | ICD-10-CM

## 2020-01-25 MED ORDER — METFORMIN HCL 500 MG PO TABS: 500.0000 mg | ORAL_TABLET | Freq: Every day | ORAL | 0 refills | Status: DC

## 2020-01-25 MED ORDER — VITAMIN D (ERGOCALCIFEROL) 1.25 MG (50000 UNIT) PO CAPS: 50000.0000 [IU] | ORAL_CAPSULE | ORAL | 0 refills | Status: DC

## 2020-01-26 NOTE — Progress Notes (Signed)
Chief Complaint:   OBESITY [name] is here to discuss her progress with her obesity treatment plan along with follow-up of her obesity related diagnoses. [name] is on keeping a food journal and adhering to recommended goals of 1200 calories and 75+ grams of protein daily and states she is following her eating plan approximately 50% of the time. [name] states she is doing 0 minutes 0 times per week.  Today's visit was #: 57 Starting weight: 261 lbs Starting date: 12/28/2017 Today's weight: 260 lbs Today's date: 01/25/2020 Total lbs lost to date: 1 Total lbs lost since last in-office visit: 0  Interim History: [name] is getting ready to have the gastric sleeve by Dr. Toney Rakes in about 3 weeks. She will be on a liver shrinking diet for approximately 2 weeks before surgery.  Subjective:   1. Vitamin D deficiency [name] is stable on Vit D, and she denies nausea, vomiting, or muscle weakness.  2. Pre-diabetes [name] is stable on metformin and is working on diet. She denies nausea or vomiting. She was told she will discontinue metformin when she has her surgery which I agree with.  Assessment/Plan:   1. Vitamin D deficiency Low Vitamin D level contributes to fatigue and are associated with obesity, breast, and colon cancer. We will refill prescription Vitamin D for 90 days with no refills. [name] will follow-up for routine testing of Vitamin D, at least 2-3 times per year to avoid over-replacement. She will get her labs done with her surgeon.  - Vitamin D, Ergocalciferol, (DRISDOL) 1.25 MG (50000 UNIT) CAPS capsule; Take 1 capsule (50,000 Units total) by mouth every 7 (seven) days.  Dispense: 12 capsule; Refill: 0  2. Pre-diabetes [name] will continue to work on weight loss, exercise, and decreasing simple carbohydrates to help decrease the risk of diabetes. We will refill metformin for 1 month. [name] is to discontinues metformin per her surgeon's instructions.  - metFORMIN  (GLUCOPHAGE) 500 MG tablet; Take 1 tablet (500 mg total) by mouth daily with breakfast.  Dispense: 30 tablet; Refill: 0  3. Class 3 severe obesity with serious comorbidity and body mass index (BMI) of 45.0 to 49.9 in adult, unspecified obesity type (HCC) [name] is currently in the action stage of change. As such, her goal is to continue with weight loss efforts. She has agreed to Per surgeon's plan.   [name] is to get pre-op labs from her surgeon to avoid extra lab draws and will follow up.  Behavioral modification strategies: increasing lean protein intake and meal planning and cooking strategies.  Zelinda has agreed to follow-up with our clinic in 4 months. She was informed of the importance of frequent follow-up visits to maximize her success with intensive lifestyle modifications for her multiple health conditions.   Objective:   Blood pressure 130/83, pulse 71, temperature 98 F (36.7 C), temperature source Oral, height 5\' 1"  (1.549 m), weight 260 lb (117.9 kg), SpO2 99 %. Body mass index is 49.13 kg/m.  General: Cooperative, alert, well developed, in no acute distress. HEENT: Conjunctivae and lids unremarkable. Cardiovascular: Regular rhythm.  Lungs: Normal work of breathing. Neurologic: No focal deficits.   Lab Results  Component Value Date   CREATININE 1.07 (H) 07/25/2019   BUN 18 07/25/2019   NA 142 07/25/2019   K 4.1 07/25/2019   CL 107 (H) 07/25/2019   CO2 23 07/25/2019   Lab Results  Component Value Date   ALT 15 07/25/2019   AST 22 07/25/2019   ALKPHOS 88  07/25/2019   BILITOT 0.4 07/25/2019   Lab Results  Component Value Date   HGBA1C 5.7 (H) 07/25/2019   HGBA1C 5.7 (H) 04/11/2019   HGBA1C 5.6 08/10/2018   HGBA1C 5.6 05/18/2018   HGBA1C 5.7 (H) 12/28/2017   Lab Results  Component Value Date   INSULIN 16.3 07/25/2019   INSULIN 10.4 04/11/2019   INSULIN 7.7 08/10/2018   INSULIN 14.4 05/18/2018   INSULIN 14.0 12/28/2017   Lab Results  Component Value  Date   TSH 2.410 04/11/2019   Lab Results  Component Value Date   CHOL 189 07/25/2019   HDL 53 07/25/2019   LDLCALC 115 (H) 07/25/2019   TRIG 119 07/25/2019   Lab Results  Component Value Date   WBC 5.6 12/28/2017   HGB 12.7 12/28/2017   HCT 39.2 12/28/2017   MCV 80 12/28/2017   PLT 247 08/27/2016   No results found for: IRON, TIBC, FERRITIN  Attestation Statements:   Reviewed by clinician on day of visit: allergies, medications, problem list, medical history, surgical history, family history, social history, and previous encounter notes.   I, Trixie Dredge, am acting as transcriptionist for Dennard Nip, MD.  I have reviewed the above documentation for accuracy and completeness, and I agree with the above. -  Dennard Nip, MD

## 2020-01-29 ENCOUNTER — Other Ambulatory Visit (INDEPENDENT_AMBULATORY_CARE_PROVIDER_SITE_OTHER): Payer: Self-pay | Admitting: Family Medicine

## 2020-01-29 DIAGNOSIS — F3289 Other specified depressive episodes: Secondary | ICD-10-CM

## 2020-02-03 DIAGNOSIS — Z6841 Body Mass Index (BMI) 40.0 and over, adult: Secondary | ICD-10-CM | POA: Diagnosis not present

## 2020-02-07 DIAGNOSIS — Z713 Dietary counseling and surveillance: Secondary | ICD-10-CM | POA: Diagnosis not present

## 2020-02-14 ENCOUNTER — Other Ambulatory Visit (INDEPENDENT_AMBULATORY_CARE_PROVIDER_SITE_OTHER): Payer: Self-pay | Admitting: Family Medicine

## 2020-02-14 DIAGNOSIS — E559 Vitamin D deficiency, unspecified: Secondary | ICD-10-CM

## 2020-02-14 DIAGNOSIS — R0602 Shortness of breath: Secondary | ICD-10-CM | POA: Diagnosis not present

## 2020-02-14 DIAGNOSIS — I10 Essential (primary) hypertension: Secondary | ICD-10-CM | POA: Diagnosis not present

## 2020-02-14 DIAGNOSIS — Z6841 Body Mass Index (BMI) 40.0 and over, adult: Secondary | ICD-10-CM | POA: Diagnosis not present

## 2020-02-14 DIAGNOSIS — M6208 Separation of muscle (nontraumatic), other site: Secondary | ICD-10-CM | POA: Diagnosis not present

## 2020-02-14 DIAGNOSIS — K660 Peritoneal adhesions (postprocedural) (postinfection): Secondary | ICD-10-CM | POA: Diagnosis not present

## 2020-02-14 DIAGNOSIS — K449 Diaphragmatic hernia without obstruction or gangrene: Secondary | ICD-10-CM | POA: Diagnosis not present

## 2020-02-15 DIAGNOSIS — M6208 Separation of muscle (nontraumatic), other site: Secondary | ICD-10-CM | POA: Diagnosis not present

## 2020-02-15 DIAGNOSIS — K660 Peritoneal adhesions (postprocedural) (postinfection): Secondary | ICD-10-CM | POA: Diagnosis not present

## 2020-02-15 DIAGNOSIS — Z6841 Body Mass Index (BMI) 40.0 and over, adult: Secondary | ICD-10-CM | POA: Diagnosis not present

## 2020-02-15 DIAGNOSIS — K449 Diaphragmatic hernia without obstruction or gangrene: Secondary | ICD-10-CM | POA: Diagnosis not present

## 2020-02-17 ENCOUNTER — Other Ambulatory Visit (INDEPENDENT_AMBULATORY_CARE_PROVIDER_SITE_OTHER): Payer: Self-pay | Admitting: Family Medicine

## 2020-02-17 DIAGNOSIS — R7303 Prediabetes: Secondary | ICD-10-CM

## 2020-03-02 ENCOUNTER — Other Ambulatory Visit: Payer: Self-pay | Admitting: Family Medicine

## 2020-03-02 ENCOUNTER — Ambulatory Visit
Admission: RE | Admit: 2020-03-02 | Discharge: 2020-03-02 | Disposition: A | Discharge status: Home / Self Care | Payer: Self-pay | Source: Ambulatory Visit | Attending: Family Medicine | Admitting: Family Medicine

## 2020-03-02 ENCOUNTER — Other Ambulatory Visit: Payer: Self-pay

## 2020-03-02 DIAGNOSIS — M79671 Pain in right foot: Secondary | ICD-10-CM

## 2020-03-02 DIAGNOSIS — M19071 Primary osteoarthritis, right ankle and foot: Secondary | ICD-10-CM | POA: Diagnosis not present

## 2020-03-11 ENCOUNTER — Other Ambulatory Visit (INDEPENDENT_AMBULATORY_CARE_PROVIDER_SITE_OTHER): Payer: Self-pay | Admitting: Family Medicine

## 2020-03-11 DIAGNOSIS — E559 Vitamin D deficiency, unspecified: Secondary | ICD-10-CM

## 2020-03-16 DIAGNOSIS — Z903 Acquired absence of stomach [part of]: Secondary | ICD-10-CM | POA: Diagnosis not present

## 2020-03-16 DIAGNOSIS — Z713 Dietary counseling and surveillance: Secondary | ICD-10-CM | POA: Diagnosis not present

## 2020-03-19 DIAGNOSIS — M19071 Primary osteoarthritis, right ankle and foot: Secondary | ICD-10-CM | POA: Diagnosis not present

## 2020-03-19 DIAGNOSIS — Z01818 Encounter for other preprocedural examination: Secondary | ICD-10-CM | POA: Diagnosis not present

## 2020-05-18 DIAGNOSIS — Z903 Acquired absence of stomach [part of]: Secondary | ICD-10-CM | POA: Diagnosis not present

## 2020-05-18 DIAGNOSIS — Z713 Dietary counseling and surveillance: Secondary | ICD-10-CM | POA: Diagnosis not present

## 2020-05-28 DIAGNOSIS — F4320 Adjustment disorder, unspecified: Secondary | ICD-10-CM | POA: Diagnosis not present

## 2020-05-28 DIAGNOSIS — Z9884 Bariatric surgery status: Secondary | ICD-10-CM | POA: Diagnosis not present

## 2020-05-29 ENCOUNTER — Ambulatory Visit (INDEPENDENT_AMBULATORY_CARE_PROVIDER_SITE_OTHER): Payer: BC Managed Care – PPO | Admitting: Family Medicine

## 2020-05-29 ENCOUNTER — Encounter (INDEPENDENT_AMBULATORY_CARE_PROVIDER_SITE_OTHER): Payer: Self-pay | Admitting: Family Medicine

## 2020-05-29 ENCOUNTER — Other Ambulatory Visit: Payer: Self-pay

## 2020-05-29 VITALS — BP 117/82 | HR 63 | Temp 98.2°F | Ht 61.0 in | Wt 229.0 lb

## 2020-05-29 DIAGNOSIS — Z9189 Other specified personal risk factors, not elsewhere classified: Secondary | ICD-10-CM | POA: Diagnosis not present

## 2020-05-29 DIAGNOSIS — E559 Vitamin D deficiency, unspecified: Secondary | ICD-10-CM

## 2020-05-29 DIAGNOSIS — Z6841 Body Mass Index (BMI) 40.0 and over, adult: Secondary | ICD-10-CM | POA: Diagnosis not present

## 2020-05-29 MED ORDER — VITAMIN D (ERGOCALCIFEROL) 1.25 MG (50000 UNIT) PO CAPS: 50000.0000 [IU] | ORAL_CAPSULE | ORAL | 0 refills | Status: DC

## 2020-05-29 NOTE — Progress Notes (Signed)
Chief Complaint:   OBESITY [name] is here to discuss her progress with her obesity treatment plan along with follow-up of her obesity related diagnoses. [name] is on keeping a food journal and adhering to recommended goals of 1200 calories and 75+ grams of protein daily and states she is following her eating plan approximately 0% of the time. [name] states she is walking and doing strength training for 30 minutes 4 times per week.  Today's visit was #: 58 Starting weight: 261 lbs Starting date: 12/28/2017 Today's weight: 229 lbs Today's date: 05/29/2020 Total lbs lost to date: 32 Total lbs lost since last in-office visit: 31  Interim History: [name] had her lap band removed and had the gastric sleeve done in May. She notes decreased hunger and she is working on increasing protein, primarily through protein drinks right now. She is working on meal planning and prepping.  Subjective:   1. 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  2. At risk for dehydration [name] is at risk for dehydration due to weight loss surgery, heat, and HCTZ.  Assessment/Plan:   1. Vitamin D deficiency Low Vitamin D level contributes to fatigue and are associated with obesity, breast, and colon cancer. We will refill prescription Vitamin D for 90 days with no refill. [name] will follow-up for routine testing of Vitamin D, at least 2-3 times per year to avoid over-replacement. She is to get labs done at her surgeon's office soon.  - Vitamin D, Ergocalciferol, (DRISDOL) 1.25 MG (50000 UNIT) CAPS capsule; Take 1 capsule (50,000 Units total) by mouth every 7 (seven) days.  Dispense: 12 capsule; Refill: 0  2. At risk for dehydration [name] was given approximately 15 minutes dehydration prevention counseling today. [name] is at risk for dehydration due to weight loss and current medication(s). She was encouraged to hydrate and monitor fluid status to avoid  dehydration as well as weight loss plateaus.   3. Class 3 severe obesity with serious comorbidity and body mass index (BMI) of 40.0 to 44.9 in adult, unspecified obesity type (HCC) [name] is currently in the action stage of change. As such, her goal is to continue with weight loss efforts. She has agreed to practicing portion control and making smarter food choices, such as increasing vegetables and decreasing simple carbohydrates. Pt to follow up with her surgeons office as instructed for further instructions and education.  Exercise goals: As is.  Behavioral modification strategies: increasing lean protein intake and increasing water intake (80 oz).  [name] has agreed to follow-up with our clinic in 3 months. .   Objective:   Blood pressure 117/82, pulse 63, temperature 98.2 F (36.8 C), height 5\' 1"  (1.549 m), weight 229 lb (103.9 kg), SpO2 100 %. Body mass index is 43.27 kg/m.  General: Cooperative, alert, well developed, in no acute distress. HEENT: Conjunctivae and lids unremarkable. Cardiovascular: Regular rhythm.  Lungs: Normal work of breathing. Neurologic: No focal deficits.   Lab Results  Component Value Date   CREATININE 1.07 (H) 07/25/2019   BUN 18 07/25/2019   NA 142 07/25/2019   K 4.1 07/25/2019   CL 107 (H) 07/25/2019   CO2 23 07/25/2019   Lab Results  Component Value Date   ALT 15 07/25/2019   AST 22 07/25/2019   ALKPHOS 88 07/25/2019   BILITOT 0.4 07/25/2019   Lab Results  Component Value Date   HGBA1C 5.7 (H) 07/25/2019   HGBA1C 5.7 (H) 04/11/2019  HGBA1C 5.6 08/10/2018   HGBA1C 5.6 05/18/2018   HGBA1C 5.7 (H) 12/28/2017   Lab Results  Component Value Date   INSULIN 16.3 07/25/2019   INSULIN 10.4 04/11/2019   INSULIN 7.7 08/10/2018   INSULIN 14.4 05/18/2018   INSULIN 14.0 12/28/2017   Lab Results  Component Value Date   TSH 2.410 04/11/2019   Lab Results  Component Value Date   CHOL 189 07/25/2019   HDL 53 07/25/2019   LDLCALC 115  (H) 07/25/2019   TRIG 119 07/25/2019   Lab Results  Component Value Date   WBC 5.6 12/28/2017   HGB 12.7 12/28/2017   HCT 39.2 12/28/2017   MCV 80 12/28/2017   PLT 247 08/27/2016   No results found for: IRON, TIBC, FERRITIN  Attestation Statements:   Reviewed by clinician on day of visit: allergies, medications, problem list, medical history, surgical history, family history, social history, and previous encounter notes.   I, Trixie Dredge, am acting as transcriptionist for Dennard Nip, MD.  I have reviewed the above documentation for accuracy and completeness, and I agree with the above. -  Dennard Nip, MD

## 2020-06-06 DIAGNOSIS — E569 Vitamin deficiency, unspecified: Secondary | ICD-10-CM | POA: Diagnosis not present

## 2020-06-26 ENCOUNTER — Encounter (INDEPENDENT_AMBULATORY_CARE_PROVIDER_SITE_OTHER): Payer: Self-pay | Admitting: Family Medicine

## 2020-06-26 DIAGNOSIS — R7303 Prediabetes: Secondary | ICD-10-CM

## 2020-06-27 MED ORDER — METFORMIN HCL 500 MG PO TABS: 500.0000 mg | ORAL_TABLET | Freq: Every day | ORAL | 0 refills | Status: DC

## 2020-06-27 NOTE — Telephone Encounter (Signed)
Refill protocol sent to Dr Leafy Ro

## 2020-07-16 IMAGING — CR DG LUMBAR SPINE COMPLETE 4+V
5 series · 5 of 5 positions shown · non-contrast
Comparison: July 08, 2018

CLINICAL DATA: Low back pain with sciatica

EXAM:
LUMBAR SPINE - COMPLETE 4+ VIEW

[t lumbar spine ap]
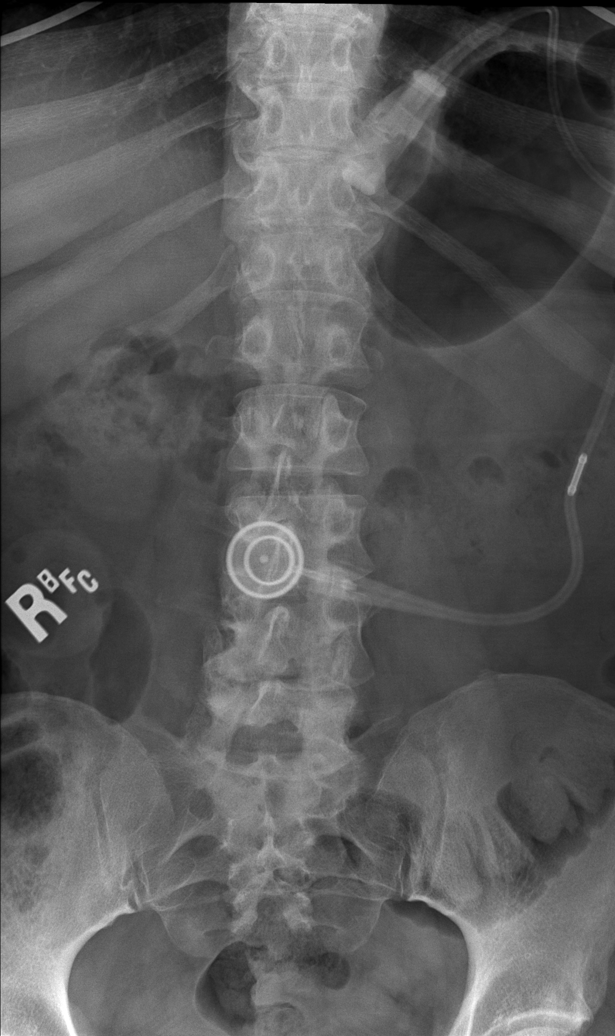

[t lumbar spine obl (1 of 2)]
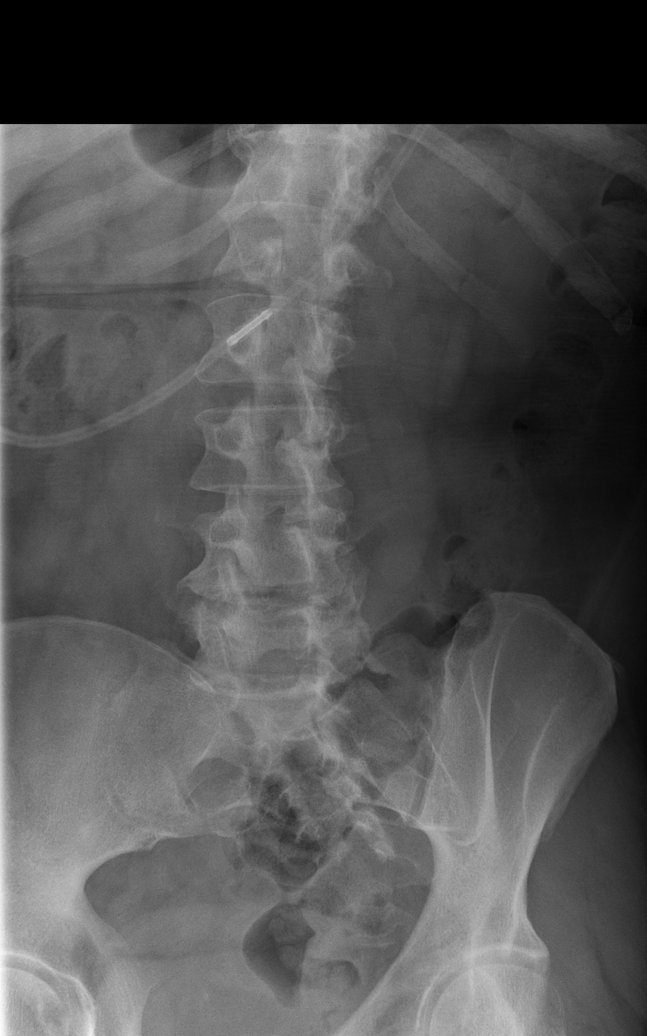

[t lumbar spine obl (2 of 2)]
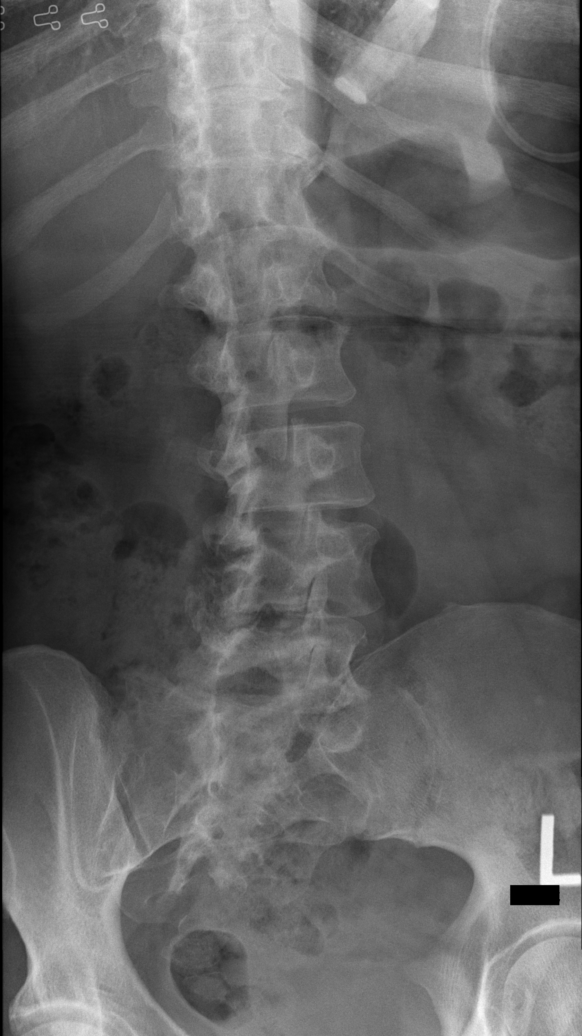

[t lumbar spine lat]
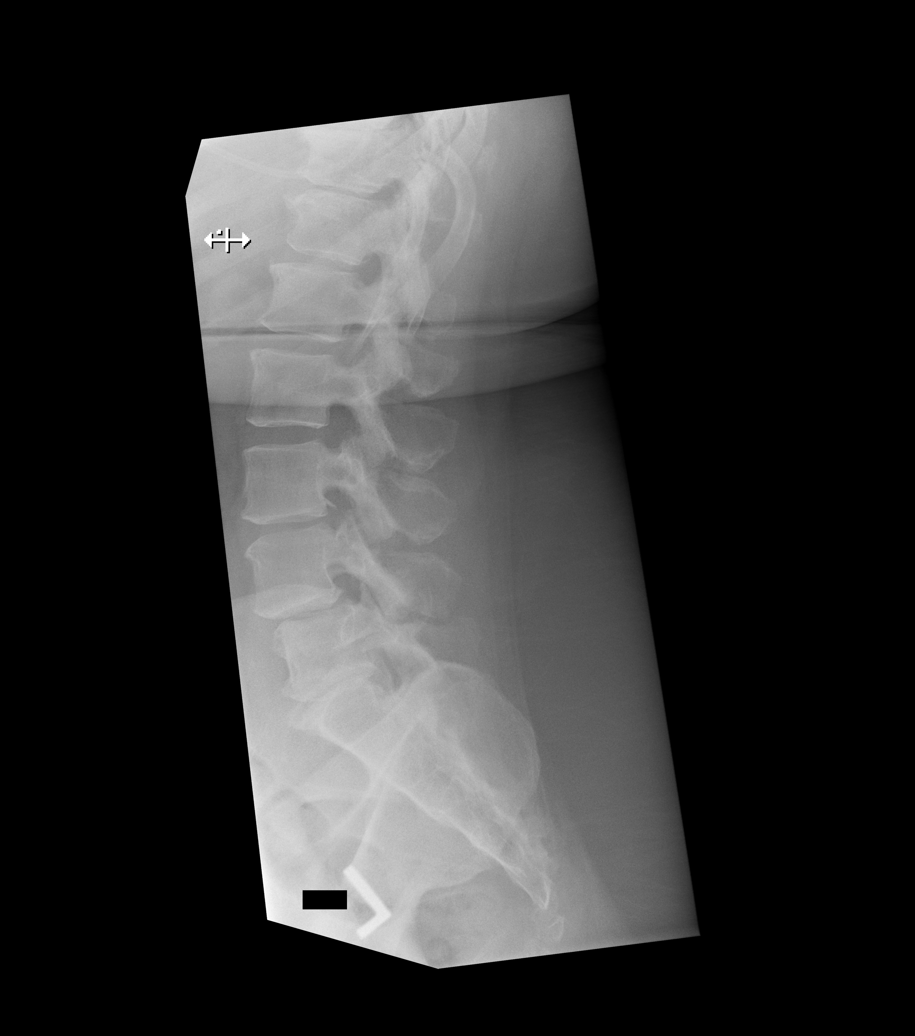

[t lumbar l-5 s-1 spot]
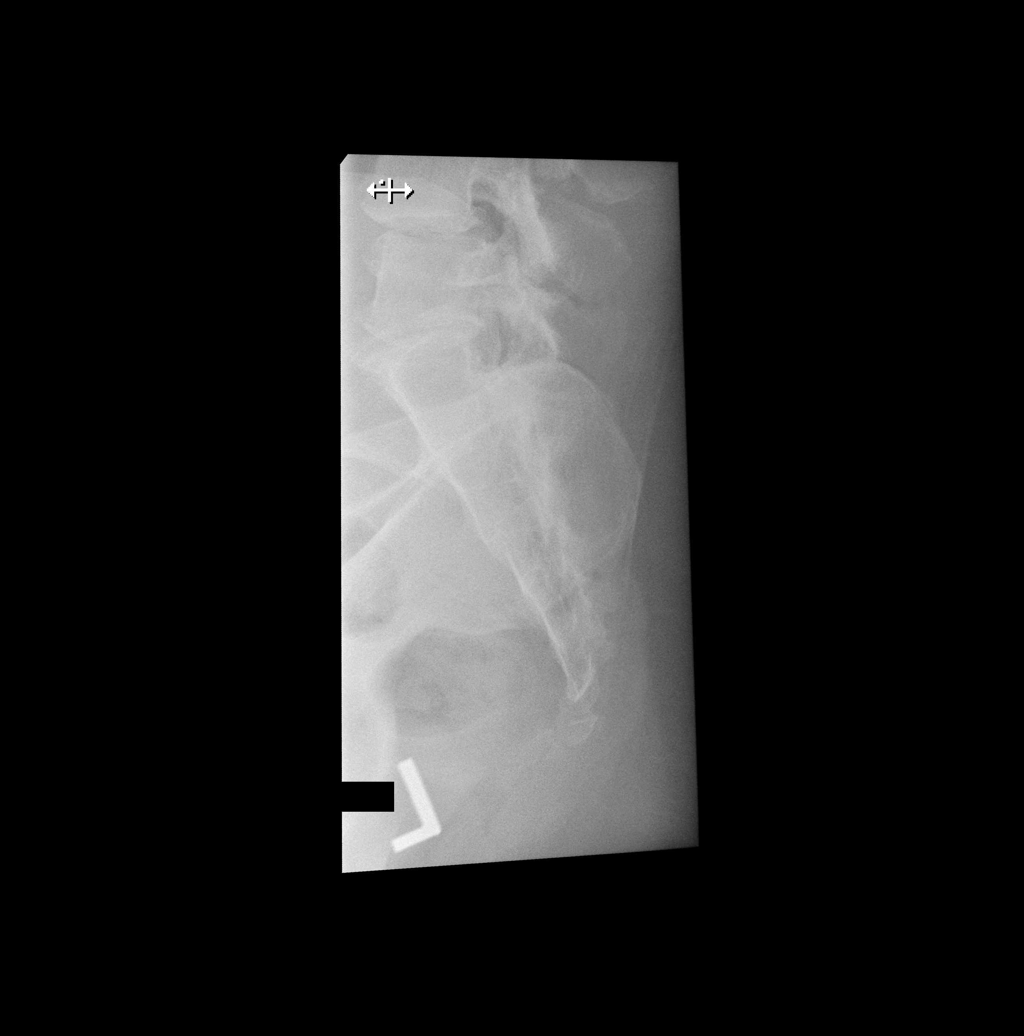

[5 of 5 positions shown; findings below may reference images not displayed]

FINDINGS: Frontal, lateral, spot lumbosacral lateral, and bilateral oblique
views were obtained. There are 5 non-rib-bearing lumbar type
vertebral bodies. No fracture. There is 5 mm of anterolisthesis of
L4 on L5, stable. No other spondylolisthesis. There is moderately
severe disc space narrowing at L3-4, L4-5, L5-S1. There is facet
osteoarthritic change at L4-5 on the left at L5-S1 bilaterally. Lap
band present at gastric cardia.
IMPRESSION: 1. Stable 5 mm anterolisthesis of L4 on L5.
2. Moderately severe osteoarthritic change at L3-4, L4-5, and L5-S1.
Osteoarthritic change at L3-4 has progressed since prior study.
3. No fracture.
4. Lap band present at gastric cardia.

## 2020-08-13 DIAGNOSIS — Z9884 Bariatric surgery status: Secondary | ICD-10-CM | POA: Diagnosis not present

## 2020-08-14 ENCOUNTER — Encounter (INDEPENDENT_AMBULATORY_CARE_PROVIDER_SITE_OTHER): Payer: Self-pay

## 2020-08-14 ENCOUNTER — Other Ambulatory Visit (INDEPENDENT_AMBULATORY_CARE_PROVIDER_SITE_OTHER): Payer: Self-pay | Admitting: Family Medicine

## 2020-08-14 DIAGNOSIS — E559 Vitamin D deficiency, unspecified: Secondary | ICD-10-CM

## 2020-08-14 NOTE — Telephone Encounter (Signed)
MyChart message sent to pt to find out if they have enough medication to get them through until next appt.   

## 2020-08-20 ENCOUNTER — Other Ambulatory Visit (INDEPENDENT_AMBULATORY_CARE_PROVIDER_SITE_OTHER): Payer: Self-pay

## 2020-08-20 DIAGNOSIS — E559 Vitamin D deficiency, unspecified: Secondary | ICD-10-CM

## 2020-08-20 MED ORDER — VITAMIN D (ERGOCALCIFEROL) 1.25 MG (50000 UNIT) PO CAPS: 50000.0000 [IU] | ORAL_CAPSULE | ORAL | 0 refills | Status: DC

## 2020-08-28 ENCOUNTER — Other Ambulatory Visit: Payer: Self-pay

## 2020-08-28 ENCOUNTER — Encounter (INDEPENDENT_AMBULATORY_CARE_PROVIDER_SITE_OTHER): Payer: Self-pay | Admitting: Family Medicine

## 2020-08-28 ENCOUNTER — Ambulatory Visit (INDEPENDENT_AMBULATORY_CARE_PROVIDER_SITE_OTHER): Payer: BC Managed Care – PPO | Admitting: Family Medicine

## 2020-08-28 VITALS — BP 126/76 | HR 60 | Temp 98.2°F | Ht 61.0 in | Wt 220.0 lb

## 2020-08-28 DIAGNOSIS — R7303 Prediabetes: Secondary | ICD-10-CM | POA: Diagnosis not present

## 2020-08-28 DIAGNOSIS — Z6841 Body Mass Index (BMI) 40.0 and over, adult: Secondary | ICD-10-CM | POA: Diagnosis not present

## 2020-08-28 NOTE — Progress Notes (Signed)
predia 

## 2020-09-03 NOTE — Progress Notes (Signed)
Chief Complaint:   OBESITY Jahmya is here to discuss her progress with her obesity treatment plan along with follow-up of her obesity related diagnoses. Brindy is on practicing portion control and making smarter food choices, such as increasing vegetables and decreasing simple carbohydrates and states she is following her eating plan approximately 0% of the time. Shavell states she is doing strengthening, cardio, and walking for 40-60 minutes 6 times per week.  Today's visit was #: 51 Starting weight: 261 lbs Starting date: 12/28/2017 Today's weight: 220 lbs Today's date: 08/28/2020 Total lbs lost to date: 41 Total lbs lost since last in-office visit: 9  Interim History: Mckena has lost 9 lbs over the last 3 months. She is exercising regularly both cardio and strength training with a personal trainer. She is working on increasing lean protein in her diet and using more protein supplements.  Subjective:   1. Pre-diabetes Virna is stable on metformin, and she is tolerating it well. She is working on exercise, and decreasing simple carbohydrates. She feels her polyphagia has improved. I discussed labs with the patient today.   Assessment/Plan:   1. Pre-diabetes Jakiah will continue metformin, and will continue to work on weight loss, exercise, and decreasing simple carbohydrates to help decrease the risk of diabetes. We will recheck labs in 2 months.  2. Class 3 severe obesity with serious comorbidity and body mass index (BMI) of 40.0 to 44.9 in adult, unspecified obesity type (HCC) Corlis is currently in the action stage of change. As such, her goal is to continue with weight loss efforts. She has agreed to keeping a food journal and adhering to recommended goals of 1000-1200 calories and 80+ grams of protein daily and practicing portion control and making smarter food choices, such as increasing vegetables and decreasing simple carbohydrates.   I recommended Beonca to use more meal  replacement foods versus drinks, as this will help keep her feeling more full and satisfied.  Exercise goals: As is.  Behavioral modification strategies: meal planning and cooking strategies.  Harshini has agreed to follow-up with our clinic in 2 to 3 weeks. She was informed of the importance of frequent follow-up visits to maximize her success with intensive lifestyle modifications for her multiple health conditions.   Objective:   Blood pressure 126/76, pulse 60, temperature 98.2 F (36.8 C), height 5\' 1"  (1.549 m), weight 220 lb (99.8 kg), SpO2 100 %. Body mass index is 41.57 kg/m.  General: Cooperative, alert, well developed, in no acute distress. HEENT: Conjunctivae and lids unremarkable. Cardiovascular: Regular rhythm.  Lungs: Normal work of breathing. Neurologic: No focal deficits.   Lab Results  Component Value Date   CREATININE 1.07 (H) 07/25/2019   BUN 18 07/25/2019   NA 142 07/25/2019   K 4.1 07/25/2019   CL 107 (H) 07/25/2019   CO2 23 07/25/2019   Lab Results  Component Value Date   ALT 15 07/25/2019   AST 22 07/25/2019   ALKPHOS 88 07/25/2019   BILITOT 0.4 07/25/2019   Lab Results  Component Value Date   HGBA1C 5.7 (H) 07/25/2019   HGBA1C 5.7 (H) 04/11/2019   HGBA1C 5.6 08/10/2018   HGBA1C 5.6 05/18/2018   HGBA1C 5.7 (H) 12/28/2017   Lab Results  Component Value Date   INSULIN 16.3 07/25/2019   INSULIN 10.4 04/11/2019   INSULIN 7.7 08/10/2018   INSULIN 14.4 05/18/2018   INSULIN 14.0 12/28/2017   Lab Results  Component Value Date   TSH 2.410 04/11/2019  Lab Results  Component Value Date   CHOL 189 07/25/2019   HDL 53 07/25/2019   LDLCALC 115 (H) 07/25/2019   TRIG 119 07/25/2019   Lab Results  Component Value Date   WBC 5.6 12/28/2017   HGB 12.7 12/28/2017   HCT 39.2 12/28/2017   MCV 80 12/28/2017   PLT 247 08/27/2016   No results found for: IRON, TIBC, FERRITIN  Attestation Statements:   Reviewed by clinician on day of visit:  allergies, medications, problem list, medical history, surgical history, family history, social history, and previous encounter notes.  Time spent on visit including pre-visit chart review and post-visit care and charting was 30 minutes.    I, Trixie Dredge, am acting as transcriptionist for Dennard Nip, MD.  I have reviewed the above documentation for accuracy and completeness, and I agree with the above. -  Dennard Nip, MD

## 2020-09-12 ENCOUNTER — Other Ambulatory Visit: Payer: Self-pay

## 2020-09-12 ENCOUNTER — Ambulatory Visit (INDEPENDENT_AMBULATORY_CARE_PROVIDER_SITE_OTHER): Payer: BC Managed Care – PPO | Admitting: Family Medicine

## 2020-09-12 ENCOUNTER — Encounter (INDEPENDENT_AMBULATORY_CARE_PROVIDER_SITE_OTHER): Payer: Self-pay | Admitting: Family Medicine

## 2020-09-12 VITALS — BP 109/71 | HR 61 | Temp 97.9°F | Ht 61.0 in | Wt 218.0 lb

## 2020-09-12 DIAGNOSIS — Z6841 Body Mass Index (BMI) 40.0 and over, adult: Secondary | ICD-10-CM

## 2020-09-12 DIAGNOSIS — E7800 Pure hypercholesterolemia, unspecified: Secondary | ICD-10-CM

## 2020-09-12 DIAGNOSIS — E559 Vitamin D deficiency, unspecified: Secondary | ICD-10-CM

## 2020-09-12 DIAGNOSIS — R0602 Shortness of breath: Secondary | ICD-10-CM | POA: Diagnosis not present

## 2020-09-12 DIAGNOSIS — R7303 Prediabetes: Secondary | ICD-10-CM | POA: Diagnosis not present

## 2020-09-12 DIAGNOSIS — Z9189 Other specified personal risk factors, not elsewhere classified: Secondary | ICD-10-CM

## 2020-09-12 MED ORDER — VITAMIN D (ERGOCALCIFEROL) 1.25 MG (50000 UNIT) PO CAPS: 50000.0000 [IU] | ORAL_CAPSULE | ORAL | 1 refills | Status: DC

## 2020-09-12 NOTE — Progress Notes (Signed)
Chief Complaint:   OBESITY [name] is here to discuss her progress with her obesity treatment plan along with follow-up of her obesity related diagnoses. [name] is on keeping a food journal and adhering to recommended goals of 1000-1200 calories and 80+ grams of protein daily or practicing portion control and making smarter food choices, such as increasing vegetables and decreasing simple carbohydrates and states she is following her eating plan approximately 0% of the time. [name] states she is doing cardio, strength, and aquazumba for 40-60 minutes 4-6 times per week.  Today's visit was #: 39 Starting weight: 261 lbs Starting date: 12/28/2017 Today's weight: 218 lbs Today's date: 09/12/2020 Total lbs lost to date: 43 Total lbs lost since last in-office visit: 2  Interim History: [name] is status post sleeve gastrectomy in 01/2020 at [facility]. She is on phentermine and Topamax by their bariatric team. She continues to do well with weight loss, and she is working on increasing protein. She has no food intolerances. She is exercising regularly.  Subjective:   1. Pre-diabetes [name] is doing  Well with diet and metformin, and she denies nausea or vomiting.  2. Vitamin D deficiency [name] is doing well on Vit D prescription, and she is due for labs.  3. Pure hypercholesterolemia [name] is doing well with diet, and she is due to have labs checked. She denies chest pain.  4. Shortness of breath on exertion [name] notes decreased shortness of breath with increased exercises in additional weight loss.  5. At risk for impaired metabolic function [name] is at increased risk for impaired metabolic function if her protein decreases.  Assessment/Plan:   1. Pre-diabetes [name] will continue to work on weight loss, exercise, and decreasing simple carbohydrates to help decrease the risk of diabetes. We will recheck labs today.  - Comprehensive metabolic panel - Hemoglobin A1c -  Insulin, random  2. Vitamin D deficiency Low Vitamin D level contributes to fatigue and are associated with obesity, breast, and colon cancer. We will refill prescription Vitamin D for 2 months. [name] will follow-up for routine testing of Vitamin D, at least 2-3 times per year to avoid over-replacement.  - VITAMIN D 25 Hydroxy (Vit-D Deficiency, Fractures) - Vitamin D, Ergocalciferol, (DRISDOL) 1.25 MG (50000 UNIT) CAPS capsule; Take 1 capsule (50,000 Units total) by mouth every 7 (seven) days.  Dispense: 4 capsule; Refill: 1  3. Pure hypercholesterolemia Cardiovascular risk and specific lipid/LDL goals reviewed. We discussed several lifestyle modifications today. [name] will continue to work on diet, exercise and weight loss efforts. We will recheck labs today. Orders and follow up as documented in patient record.   - Lipid Panel With LDL/HDL Ratio  4. Shortness of breath on exertion We repeated her IC today, and it shows improved VO2 and RMR. [name] will continue exercise and weight loss as is.  5. At risk for impaired metabolic function [name] was given approximately 15 minutes of impaired  metabolic function prevention counseling today. We discussed intensive lifestyle modifications today with an emphasis on specific nutrition and exercise instructions and strategies.   Repetitive spaced learning was employed today to elicit superior memory formation and behavioral change.  6. Class 3 severe obesity with serious comorbidity and body mass index (BMI) of 40.0 to 44.9 in adult, unspecified obesity type (HCC) [name] is currently in the action stage of change. As such, her goal is to continue with weight loss efforts. She has agreed to keeping a food journal and adhering to recommended goals  of 1000-1200 calories and 80+ grams of protein daily.   Exercise goals: As is.  Behavioral modification strategies: meal planning and cooking strategies and holiday eating strategies .  [name] has  agreed to follow-up with our clinic in 8 weeks. She was informed of the importance of frequent follow-up visits to maximize her success with intensive lifestyle modifications for her multiple health conditions.   [name] was informed we would discuss her lab results at her next visit unless there is a critical issue that needs to be addressed sooner. [name] agreed to keep her next visit at the agreed upon time to discuss these results.  Objective:   Blood pressure 109/71, pulse 61, temperature 97.9 F (36.6 C), height 5\' 1"  (1.549 m), weight 218 lb (98.9 kg), SpO2 100 %. Body mass index is 41.19 kg/m.  General: Cooperative, alert, well developed, in no acute distress. HEENT: Conjunctivae and lids unremarkable. Cardiovascular: Regular rhythm.  Lungs: Normal work of breathing. Neurologic: No focal deficits.   Lab Results  Component Value Date   CREATININE 1.07 (H) 07/25/2019   BUN 18 07/25/2019   NA 142 07/25/2019   K 4.1 07/25/2019   CL 107 (H) 07/25/2019   CO2 23 07/25/2019   Lab Results  Component Value Date   ALT 15 07/25/2019   AST 22 07/25/2019   ALKPHOS 88 07/25/2019   BILITOT 0.4 07/25/2019   Lab Results  Component Value Date   HGBA1C 5.7 (H) 07/25/2019   HGBA1C 5.7 (H) 04/11/2019   HGBA1C 5.6 08/10/2018   HGBA1C 5.6 05/18/2018   HGBA1C 5.7 (H) 12/28/2017   Lab Results  Component Value Date   INSULIN 16.3 07/25/2019   INSULIN 10.4 04/11/2019   INSULIN 7.7 08/10/2018   INSULIN 14.4 05/18/2018   INSULIN 14.0 12/28/2017   Lab Results  Component Value Date   TSH 2.410 04/11/2019   Lab Results  Component Value Date   CHOL 189 07/25/2019   HDL 53 07/25/2019   LDLCALC 115 (H) 07/25/2019   TRIG 119 07/25/2019   Lab Results  Component Value Date   WBC 5.6 12/28/2017   HGB 12.7 12/28/2017   HCT 39.2 12/28/2017   MCV 80 12/28/2017   PLT 247 08/27/2016   No results found for: IRON, TIBC, FERRITIN  Attestation Statements:   Reviewed by clinician on day  of visit: allergies, medications, problem list, medical history, surgical history, family history, social history, and previous encounter notes.   I, Trixie Dredge, am acting as transcriptionist for Dennard Nip, MD.  I have reviewed the above documentation for accuracy and completeness, and I agree with the above. -  Dennard Nip, MD

## 2020-09-13 DIAGNOSIS — E119 Type 2 diabetes mellitus without complications: Secondary | ICD-10-CM | POA: Diagnosis not present

## 2020-09-13 LAB — COMPREHENSIVE METABOLIC PANEL
ALT: 15 IU/L (ref 0–32)
AST: 21 IU/L (ref 0–40)
Albumin/Globulin Ratio: 1.5 (ref 1.2–2.2)
Albumin: 4.1 g/dL (ref 3.8–4.9)
Alkaline Phosphatase: 98 IU/L (ref 44–121)
BUN/Creatinine Ratio: 14 (ref 9–23)
BUN: 14 mg/dL (ref 6–24)
Bilirubin Total: 0.6 mg/dL (ref 0.0–1.2)
CO2: 27 mmol/L (ref 20–29)
Calcium: 10.2 mg/dL (ref 8.7–10.2)
Chloride: 105 mmol/L (ref 96–106)
Creatinine, Ser: 0.97 mg/dL (ref 0.57–1.00)
GFR calc Af Amer: 76 mL/min/{1.73_m2} (ref >59)
GFR calc non Af Amer: 66 mL/min/{1.73_m2} (ref >59)
Globulin, Total: 2.7 g/dL (ref 1.5–4.5)
Glucose: 83 mg/dL (ref 65–99)
Potassium: 4.1 mmol/L (ref 3.5–5.2)
Sodium: 145 mmol/L — ABNORMAL HIGH (ref 134–144)
Total Protein: 6.8 g/dL (ref 6.0–8.5)

## 2020-09-13 LAB — LIPID PANEL WITH LDL/HDL RATIO
Cholesterol, Total: 205 mg/dL — ABNORMAL HIGH (ref 100–199)
HDL: 68 mg/dL (ref >39)
LDL Chol Calc (NIH): 120 mg/dL — ABNORMAL HIGH (ref 0–99)
LDL/HDL Ratio: 1.8 ratio (ref 0.0–3.2)
Triglycerides: 98 mg/dL (ref 0–149)
VLDL Cholesterol Cal: 17 mg/dL (ref 5–40)

## 2020-09-13 LAB — VITAMIN D 25 HYDROXY (VIT D DEFICIENCY, FRACTURES): Vit D, 25-Hydroxy: 78.5 ng/mL (ref 30.0–100.0)

## 2020-09-13 LAB — HEMOGLOBIN A1C
Est. average glucose Bld gHb Est-mCnc: 117 mg/dL
Hgb A1c MFr Bld: 5.7 % — ABNORMAL HIGH (ref 4.8–5.6)

## 2020-09-13 LAB — INSULIN, RANDOM: INSULIN: 8.7 u[IU]/mL (ref 2.6–24.9)

## 2020-09-17 ENCOUNTER — Other Ambulatory Visit (INDEPENDENT_AMBULATORY_CARE_PROVIDER_SITE_OTHER): Payer: Self-pay | Admitting: Family Medicine

## 2020-09-17 ENCOUNTER — Encounter (INDEPENDENT_AMBULATORY_CARE_PROVIDER_SITE_OTHER): Payer: Self-pay

## 2020-09-17 DIAGNOSIS — R7303 Prediabetes: Secondary | ICD-10-CM

## 2020-09-17 NOTE — Telephone Encounter (Signed)
MyChart message sent to pt to find out if they have enough medication to get them through until next appt.   

## 2020-09-25 DIAGNOSIS — Z6841 Body Mass Index (BMI) 40.0 and over, adult: Secondary | ICD-10-CM | POA: Diagnosis not present

## 2020-09-25 DIAGNOSIS — Z9884 Bariatric surgery status: Secondary | ICD-10-CM | POA: Diagnosis not present

## 2020-09-25 DIAGNOSIS — Z7182 Exercise counseling: Secondary | ICD-10-CM | POA: Diagnosis not present

## 2020-09-25 DIAGNOSIS — E669 Obesity, unspecified: Secondary | ICD-10-CM | POA: Diagnosis not present

## 2020-10-29 ENCOUNTER — Other Ambulatory Visit (INDEPENDENT_AMBULATORY_CARE_PROVIDER_SITE_OTHER): Payer: Self-pay | Admitting: Family Medicine

## 2020-10-29 DIAGNOSIS — R7303 Prediabetes: Secondary | ICD-10-CM

## 2020-10-30 ENCOUNTER — Encounter (INDEPENDENT_AMBULATORY_CARE_PROVIDER_SITE_OTHER): Payer: Self-pay

## 2020-10-30 NOTE — Telephone Encounter (Signed)
Last OV was with Dr. Leafy Ro

## 2020-10-30 NOTE — Telephone Encounter (Signed)
Refill request

## 2020-10-30 NOTE — Telephone Encounter (Signed)
Last seen Dr Beasley ° °

## 2020-10-30 NOTE — Telephone Encounter (Signed)
MyChart message sent to pt to find out if they have enough medication to get them through until next appt.   

## 2020-10-30 NOTE — Telephone Encounter (Signed)
Ok x 1

## 2020-10-30 NOTE — Telephone Encounter (Signed)
Last OV with Dr Brown 

## 2020-10-31 ENCOUNTER — Other Ambulatory Visit (INDEPENDENT_AMBULATORY_CARE_PROVIDER_SITE_OTHER): Payer: Self-pay

## 2020-10-31 DIAGNOSIS — R7303 Prediabetes: Secondary | ICD-10-CM

## 2020-10-31 MED ORDER — METFORMIN HCL 500 MG PO TABS: 500.0000 mg | ORAL_TABLET | Freq: Every day | ORAL | 0 refills | Status: DC

## 2020-11-21 ENCOUNTER — Other Ambulatory Visit: Payer: Self-pay

## 2020-11-21 ENCOUNTER — Encounter (INDEPENDENT_AMBULATORY_CARE_PROVIDER_SITE_OTHER): Payer: Self-pay | Admitting: Family Medicine

## 2020-11-21 ENCOUNTER — Ambulatory Visit (INDEPENDENT_AMBULATORY_CARE_PROVIDER_SITE_OTHER): Payer: BC Managed Care – PPO | Admitting: Family Medicine

## 2020-11-21 VITALS — BP 112/71 | HR 63 | Temp 97.9°F | Ht 61.0 in | Wt 216.0 lb

## 2020-11-21 DIAGNOSIS — E559 Vitamin D deficiency, unspecified: Secondary | ICD-10-CM | POA: Diagnosis not present

## 2020-11-21 DIAGNOSIS — Z9189 Other specified personal risk factors, not elsewhere classified: Secondary | ICD-10-CM

## 2020-11-21 DIAGNOSIS — R7303 Prediabetes: Secondary | ICD-10-CM

## 2020-11-21 DIAGNOSIS — Z6841 Body Mass Index (BMI) 40.0 and over, adult: Secondary | ICD-10-CM | POA: Diagnosis not present

## 2020-11-21 MED ORDER — VITAMIN D (ERGOCALCIFEROL) 1.25 MG (50000 UNIT) PO CAPS: 50000.0000 [IU] | ORAL_CAPSULE | ORAL | 0 refills | Status: DC

## 2020-11-21 MED ORDER — METFORMIN HCL 500 MG PO TABS: 500.0000 mg | ORAL_TABLET | Freq: Every day | ORAL | 0 refills | Status: DC

## 2020-11-22 NOTE — Progress Notes (Signed)
Chief Complaint:   OBESITY [name] is here to discuss her progress with her obesity treatment plan along with follow-up of her obesity related diagnoses. [name] is on keeping a food journal and adhering to recommended goals of 1000-1200 calories and 80+ grams of protein daily and states she is following her eating plan approximately 0% of the time. [name] states she is doing cardio and strengthening for 20 minutes 5-6 times per week.  Today's visit was #: 6 Starting weight: 261 lbs Starting date: 12/28/2017 Today's weight: 216 lbs Today's date: 11/21/2020 Total lbs lost to date: 41 Total lbs lost since last in-office visit: 2  Interim History: [name] continues to do well with weight loss although she has a lot of stressors and changes in her life, but she has tried to be mindful and make better choices.  Subjective:   1. Pre-diabetes [name] continues to work on diet and weight loss. She denies signs of hypoglycemia.  2. Vitamin D deficiency [name] is on Vit D, and she denies nausea or vomiting. She requests a refill today.  3. At risk for heart disease [name] is at a higher than average risk for cardiovascular disease due to obesity.   Assessment/Plan:   1. Pre-diabetes [name] will continue to work on weight loss, exercise, and decreasing simple carbohydrates to help decrease the risk of diabetes. We will refill metformin for 1 month.  - metFORMIN (GLUCOPHAGE) 500 MG tablet; Take 1 tablet (500 mg total) by mouth daily with breakfast.  Dispense: 30 tablet; Refill: 0  2. Vitamin D deficiency Low Vitamin D level contributes to fatigue and are associated with obesity, breast, and colon cancer. We will refill prescription Vitamin D for 1 month. [name] will follow-up for routine testing of Vitamin D, at least 2-3 times per year to avoid over-replacement.  - Vitamin D, Ergocalciferol, (DRISDOL) 1.25 MG (50000 UNIT) CAPS capsule; Take 1 capsule (50,000 Units total) by mouth every 7  (seven) days.  Dispense: 4 capsule; Refill: 0  3. At risk for heart disease [name] was given approximately 15 minutes of coronary artery disease prevention counseling today. She is 55 y.o. female and has risk factors for heart disease including obesity. We discussed intensive lifestyle modifications today with an emphasis on specific weight loss instructions and strategies.   Repetitive spaced learning was employed today to elicit superior memory formation and behavioral change.  4. Class 3 severe obesity with serious comorbidity and body mass index (BMI) of 40.0 to 44.9 in adult, unspecified obesity type (HCC) [name] is currently in the action stage of change. As such, her goal is to continue with weight loss efforts. She has agreed to the Category 2 Plan or keeping a food journal and adhering to recommended goals of 1000-1200 calories and 80+ grams of protein daily.   Exercise goals: As is.  Behavioral modification strategies: increasing lean protein intake and meal planning and cooking strategies.  [name] has agreed to follow-up with our clinic in 2 weeks. She was informed of the importance of frequent follow-up visits to maximize her success with intensive lifestyle modifications for her multiple health conditions.   Objective:   Blood pressure 112/71, pulse 63, temperature 97.9 F (36.6 C), height 5\' 1"  (1.549 m), weight 216 lb (98 kg), SpO2 100 %. Body mass index is 40.81 kg/m.  General: Cooperative, alert, well developed, in no acute distress. HEENT: Conjunctivae and lids unremarkable. Cardiovascular: Regular rhythm.  Lungs: Normal work of breathing. Neurologic: No focal deficits.  Lab Results  Component Value Date   CREATININE 0.97 09/12/2020   BUN 14 09/12/2020   NA 145 (H) 09/12/2020   K 4.1 09/12/2020   CL 105 09/12/2020   CO2 27 09/12/2020   Lab Results  Component Value Date   ALT 15 09/12/2020   AST 21 09/12/2020   ALKPHOS 98 09/12/2020   BILITOT 0.6  09/12/2020   Lab Results  Component Value Date   HGBA1C 5.7 (H) 09/12/2020   HGBA1C 5.7 (H) 07/25/2019   HGBA1C 5.7 (H) 04/11/2019   HGBA1C 5.6 08/10/2018   HGBA1C 5.6 05/18/2018   Lab Results  Component Value Date   INSULIN 8.7 09/12/2020   INSULIN 16.3 07/25/2019   INSULIN 10.4 04/11/2019   INSULIN 7.7 08/10/2018   INSULIN 14.4 05/18/2018   Lab Results  Component Value Date   TSH 2.410 04/11/2019   Lab Results  Component Value Date   CHOL 205 (H) 09/12/2020   HDL 68 09/12/2020   LDLCALC 120 (H) 09/12/2020   TRIG 98 09/12/2020   Lab Results  Component Value Date   WBC 5.6 12/28/2017   HGB 12.7 12/28/2017   HCT 39.2 12/28/2017   MCV 80 12/28/2017   PLT 247 08/27/2016   No results found for: IRON, TIBC, FERRITIN  Attestation Statements:   Reviewed by clinician on day of visit: allergies, medications, problem list, medical history, surgical history, family history, social history, and previous encounter notes.   I, Trixie Dredge, am acting as transcriptionist for Dennard Nip, MD.  I have reviewed the above documentation for accuracy and completeness, and I agree with the above. -  Dennard Nip, MD

## 2020-11-26 ENCOUNTER — Other Ambulatory Visit (INDEPENDENT_AMBULATORY_CARE_PROVIDER_SITE_OTHER): Payer: Self-pay | Admitting: Family Medicine

## 2020-11-26 DIAGNOSIS — R7303 Prediabetes: Secondary | ICD-10-CM

## 2020-12-19 ENCOUNTER — Other Ambulatory Visit: Payer: Self-pay

## 2020-12-19 ENCOUNTER — Encounter (INDEPENDENT_AMBULATORY_CARE_PROVIDER_SITE_OTHER): Payer: Self-pay | Admitting: Family Medicine

## 2020-12-19 ENCOUNTER — Ambulatory Visit (INDEPENDENT_AMBULATORY_CARE_PROVIDER_SITE_OTHER): Payer: 59 | Admitting: Family Medicine

## 2020-12-19 VITALS — BP 124/78 | HR 60 | Temp 98.2°F | Ht 61.0 in | Wt 217.0 lb

## 2020-12-19 DIAGNOSIS — Z6841 Body Mass Index (BMI) 40.0 and over, adult: Secondary | ICD-10-CM | POA: Diagnosis not present

## 2020-12-19 DIAGNOSIS — E559 Vitamin D deficiency, unspecified: Secondary | ICD-10-CM | POA: Diagnosis not present

## 2020-12-19 DIAGNOSIS — R7303 Prediabetes: Secondary | ICD-10-CM

## 2020-12-19 DIAGNOSIS — Z9189 Other specified personal risk factors, not elsewhere classified: Secondary | ICD-10-CM | POA: Diagnosis not present

## 2020-12-19 MED ORDER — METFORMIN HCL 500 MG PO TABS: 500.0000 mg | ORAL_TABLET | Freq: Every day | ORAL | 0 refills | Status: DC

## 2020-12-19 MED ORDER — VITAMIN D (ERGOCALCIFEROL) 1.25 MG (50000 UNIT) PO CAPS: 50000.0000 [IU] | ORAL_CAPSULE | ORAL | 0 refills | Status: DC

## 2020-12-24 NOTE — Progress Notes (Signed)
Chief Complaint:   OBESITY [name] is here to discuss her progress with her obesity treatment plan along with follow-up of her obesity related diagnoses. [name] is on the Category 2 Plan or keeping a food journal and adhering to recommended goals of 1000-1200 calories and 80+ grams of protein daily and states she is following her eating plan approximately 50% of the time. [name] states she is doing cardio, strengthening, and walking for 20-30 minutes 3-5 times per week.  Today's visit was #: 42 Starting weight: 261 lbs Starting date: 12/28/2017 Today's weight: 217 lbs Today's date: 12/19/2020 Total lbs lost to date: 44 Total lbs lost since last in-office visit: 0  Interim History: [name] is retaining some fluid today. She did some traveling and she tried to make better choices. Overall her hunger is well controlled.  Subjective:   1. Pre-diabetes [name] continues to work on diet and exercise. She is stable on metformin with no signs of hypoglycemia.  2. Vitamin D deficiency [name]'s Vit D level is almost at goal. She denies signs of over-replacement.  3. At risk for activity intolerance [name] is at risk for exercise intolerance due to decreased activity while healing from cellulitis.   Assessment/Plan:   1. Pre-diabetes [name] will continue to work on weight loss, diet, exercise, and decreasing simple carbohydrates to help decrease the risk of diabetes. We will refill metformin for 1 month.  - metFORMIN (GLUCOPHAGE) 500 MG tablet; Take 1 tablet (500 mg total) by mouth daily with breakfast.  Dispense: 30 tablet; Refill: 0  2. Vitamin D deficiency Low Vitamin D level contributes to fatigue and are associated with obesity, breast, and colon cancer. We will refill prescription Vitamin D for 1 month. [name] will follow-up for routine testing of Vitamin D, at least 2-3 times per year to avoid over-replacement.  - Vitamin D, Ergocalciferol, (DRISDOL) 1.25 MG (50000 UNIT) CAPS  capsule; Take 1 capsule (50,000 Units total) by mouth every 7 (seven) days.  Dispense: 4 capsule; Refill: 0  3. At risk for activity intolerance [name] was given approximately 15 minutes of exercise intolerance counseling today. She is 55 y.o. female and has risk factors exercise intolerance including obesity. We discussed intensive lifestyle modifications today with an emphasis on specific weight loss instructions and strategies. [name] will slowly increase activity as tolerated.  Repetitive spaced learning was employed today to elicit superior memory formation and behavioral change.  4. Obesity with current BMI 41.1 [name] is currently in the action stage of change. As such, her goal is to continue with weight loss efforts. She has agreed to the Category 2 Plan or keeping a food journal and adhering to recommended goals of 1100-1200 calories and 80+ grams of protein daily.   Exercise goals: As is.  Behavioral modification strategies: meal planning and cooking strategies.  [name] has agreed to follow-up with our clinic in 3 to 4 weeks. She was informed of the importance of frequent follow-up visits to maximize her success with intensive lifestyle modifications for her multiple health conditions.   Objective:   Blood pressure 124/78, pulse 60, temperature 98.2 F (36.8 C), height 5\' 1"  (1.549 m), weight 217 lb (98.4 kg), SpO2 100 %. Body mass index is 41 kg/m.  General: Cooperative, alert, well developed, in no acute distress. HEENT: Conjunctivae and lids unremarkable. Cardiovascular: Regular rhythm.  Lungs: Normal work of breathing. Neurologic: No focal deficits.   Lab Results  Component Value Date   CREATININE 0.97 09/12/2020   BUN 14 09/12/2020  NA 145 (H) 09/12/2020   K 4.1 09/12/2020   CL 105 09/12/2020   CO2 27 09/12/2020   Lab Results  Component Value Date   ALT 15 09/12/2020   AST 21 09/12/2020   ALKPHOS 98 09/12/2020   BILITOT 0.6 09/12/2020   Lab Results   Component Value Date   HGBA1C 5.7 (H) 09/12/2020   HGBA1C 5.7 (H) 07/25/2019   HGBA1C 5.7 (H) 04/11/2019   HGBA1C 5.6 08/10/2018   HGBA1C 5.6 05/18/2018   Lab Results  Component Value Date   INSULIN 8.7 09/12/2020   INSULIN 16.3 07/25/2019   INSULIN 10.4 04/11/2019   INSULIN 7.7 08/10/2018   INSULIN 14.4 05/18/2018   Lab Results  Component Value Date   TSH 2.410 04/11/2019   Lab Results  Component Value Date   CHOL 205 (H) 09/12/2020   HDL 68 09/12/2020   LDLCALC 120 (H) 09/12/2020   TRIG 98 09/12/2020   Lab Results  Component Value Date   WBC 5.6 12/28/2017   HGB 12.7 12/28/2017   HCT 39.2 12/28/2017   MCV 80 12/28/2017   PLT 247 08/27/2016   No results found for: IRON, TIBC, FERRITIN  Attestation Statements:   Reviewed by clinician on day of visit: allergies, medications, problem list, medical history, surgical history, family history, social history, and previous encounter notes.   I, Trixie Dredge, am acting as transcriptionist for Dennard Nip, MD.  I have reviewed the above documentation for accuracy and completeness, and I agree with the above. -  Dennard Nip, MD

## 2020-12-25 ENCOUNTER — Other Ambulatory Visit (INDEPENDENT_AMBULATORY_CARE_PROVIDER_SITE_OTHER): Payer: Self-pay | Admitting: Family Medicine

## 2020-12-25 DIAGNOSIS — R7303 Prediabetes: Secondary | ICD-10-CM

## 2020-12-25 NOTE — Telephone Encounter (Signed)
Dr.Beasley 

## 2020-12-28 ENCOUNTER — Encounter (INDEPENDENT_AMBULATORY_CARE_PROVIDER_SITE_OTHER): Payer: Self-pay | Admitting: Family Medicine

## 2021-01-16 ENCOUNTER — Ambulatory Visit (INDEPENDENT_AMBULATORY_CARE_PROVIDER_SITE_OTHER): Payer: 59 | Admitting: Family Medicine

## 2021-01-17 ENCOUNTER — Ambulatory Visit (INDEPENDENT_AMBULATORY_CARE_PROVIDER_SITE_OTHER): Payer: 59 | Admitting: Family Medicine

## 2021-01-24 ENCOUNTER — Other Ambulatory Visit (INDEPENDENT_AMBULATORY_CARE_PROVIDER_SITE_OTHER): Payer: Self-pay | Admitting: Family Medicine

## 2021-01-24 DIAGNOSIS — R7303 Prediabetes: Secondary | ICD-10-CM

## 2021-03-13 ENCOUNTER — Other Ambulatory Visit: Payer: Self-pay | Admitting: Family Medicine

## 2021-03-13 DIAGNOSIS — M25562 Pain in left knee: Secondary | ICD-10-CM

## 2021-03-13 NOTE — Progress Notes (Signed)
Knee

## 2021-04-03 ENCOUNTER — Ambulatory Visit
Admission: RE | Admit: 2021-04-03 | Discharge: 2021-04-03 | Disposition: A | Discharge status: Home / Self Care | Payer: 59 | Source: Ambulatory Visit | Attending: Family Medicine | Admitting: Family Medicine

## 2021-04-03 ENCOUNTER — Other Ambulatory Visit: Payer: Self-pay

## 2021-04-03 DIAGNOSIS — M25562 Pain in left knee: Secondary | ICD-10-CM

## 2021-06-11 ENCOUNTER — Other Ambulatory Visit (INDEPENDENT_AMBULATORY_CARE_PROVIDER_SITE_OTHER): Payer: Self-pay | Admitting: Family Medicine

## 2021-06-11 DIAGNOSIS — E559 Vitamin D deficiency, unspecified: Secondary | ICD-10-CM

## 2022-04-06 ENCOUNTER — Encounter: Payer: Self-pay | Admitting: Plastic Surgery

## 2022-04-06 NOTE — Telephone Encounter (Signed)
Left message for patient to call for change in appointment.

## 2022-04-24 ENCOUNTER — Institutional Professional Consult (permissible substitution): Payer: 59 | Admitting: Plastic Surgery

## 2022-04-25 ENCOUNTER — Institutional Professional Consult (permissible substitution): Payer: 59 | Admitting: Plastic Surgery

## 2022-04-25 ENCOUNTER — Encounter: Payer: Self-pay | Admitting: Plastic Surgery

## 2022-04-25 ENCOUNTER — Ambulatory Visit: Payer: 59 | Admitting: Plastic Surgery

## 2022-04-25 VITALS — BP 140/86 | HR 72 | Ht 62.0 in | Wt 202.6 lb

## 2022-04-25 DIAGNOSIS — M545 Low back pain, unspecified: Secondary | ICD-10-CM

## 2022-04-25 DIAGNOSIS — G8929 Other chronic pain: Secondary | ICD-10-CM

## 2022-04-25 DIAGNOSIS — M546 Pain in thoracic spine: Secondary | ICD-10-CM | POA: Diagnosis not present

## 2022-04-25 DIAGNOSIS — M542 Cervicalgia: Secondary | ICD-10-CM | POA: Diagnosis not present

## 2022-04-25 DIAGNOSIS — Z6837 Body mass index (BMI) 37.0-37.9, adult: Secondary | ICD-10-CM

## 2022-04-25 DIAGNOSIS — R21 Rash and other nonspecific skin eruption: Secondary | ICD-10-CM

## 2022-04-25 DIAGNOSIS — N62 Hypertrophy of breast: Secondary | ICD-10-CM | POA: Diagnosis not present

## 2022-04-25 DIAGNOSIS — Z9884 Bariatric surgery status: Secondary | ICD-10-CM

## 2022-04-26 NOTE — Progress Notes (Signed)
Referring Provider Delsa Bern, MD [address] [place],  [place] [id number]   CC:  Breast hypertrophy and abdominal pannus    [name] is an 56 y.o. female.  HPI:   The patient is a 56 y.o. female with a history of mammary hyperplasia for several years.  She has extremely large breasts causing symptoms that include the following: Back pain in the upper and lower back, including neck pain. She pulls or pins her bra straps to provide better lift and relief of the pressure and pain. She notices relief by holding her breast up manually.  Her shoulder straps cause grooves and pain and pressure that requires padding for relief. Pain medication is sometimes required with motrin and tylenol.  Activities that are hindered by enlarged breasts include: exercise and running.  She has tried supportive clothing as well as fitted bras without improvement.     Mammogram history: may need current.  Family history of breast cancer:  none.  Tobacco use:  none.   The patient expresses the desire to pursue surgical intervention.  The BMI = 37.  Preoperative bra size = DD cup.   In addition the patient is interested in abdominal panniculectomy.  She does have significant rashes which she is prescribed powder.  She is has a history of gastric sleeve procedure.  She is lost weight from 302 pounds to 202 pounds.  Allergies  Allergen Reactions   No Known Allergies     Outpatient Encounter Medications as of 04/25/2022  Medication Sig Note   amoxicillin (AMOXIL) 500 MG capsule Take 500 mg by mouth. Take 4 capsule by mouth prior to dental procedure    aspirin-acetaminophen-caffeine (EXCEDRIN MIGRAINE) 250-250-65 MG tablet Take by mouth every 6 (six) hours as needed for headache.    Calcium 600-200 MG-UNIT tablet Take 1 tablet by mouth daily.    calcium citrate-vitamin D (CALCIUM CITRATE CHEWY BITE) 500-500 MG-UNIT chewable tablet Chew 1 tablet by mouth 3 (three) times daily.     hydrochlorothiazide (HYDRODIURIL) 25 MG tablet Take by mouth.    Multiple Vitamins-Minerals (BARIATRIC MULTIVITAMINS/IRON PO) Take 1 tablet by mouth daily.    nystatin (MYCOSTATIN/NYSTOP) powder Apply topically.    OZEMPIC, 1 MG/DOSE, 4 MG/3ML SOPN SMARTSIG:0.75 Milliliter(s) SUB-Q Once a Week    [DISCONTINUED] hydrochlorothiazide (HYDRODIURIL) 25 MG tablet Take 12.5-25 mg by mouth daily. 04/25/2022: Pt taking 1/2 tablet.    [DISCONTINUED] cephALEXin (KEFLEX) 500 MG capsule Take 500 mg by mouth in the morning and at bedtime. (Patient not taking: Reported on 04/25/2022)    [DISCONTINUED] metFORMIN (GLUCOPHAGE) 500 MG tablet Take 1 tablet (500 mg total) by mouth daily with breakfast. (Patient not taking: Reported on 04/25/2022)    [DISCONTINUED] naproxen sodium (ALEVE) 220 MG tablet Take 220 mg by mouth daily as needed. (Patient not taking: Reported on 04/25/2022)    [DISCONTINUED] Vitamin D, Ergocalciferol, (DRISDOL) 1.25 MG (50000 UNIT) CAPS capsule Take 1 capsule (50,000 Units total) by mouth every 7 (seven) days. (Patient not taking: Reported on 04/25/2022)    No facility-administered encounter medications on file as of 04/25/2022.     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   Hypertension    Joint pain    Leg edema    Obesity    S/P lap band 2010   Vitamin D deficiency     Past Surgical History:  Procedure Laterality Date   ABDOMINAL  HYSTERECTOMY     s/p endometrial adenocarcinoma   LAPAROSCOPIC GASTRIC BANDING  04/17/2009   TOTAL KNEE ARTHROPLASTY Left 08/25/2016   Procedure: TOTAL KNEE ARTHROPLASTY;  Surgeon: Elsie Saas, MD;  Location: [facility];  Service: Orthopedics;  Laterality: Left;    Family History  Problem Relation Age of Onset   Hypertension Mother    Kidney disease Mother    Hypertension Sister    Hypertension Maternal Grandmother     Social History   Social History Narrative   Not on file      Review of Systems General: Denies fevers, chills, weight loss CV: Denies chest pain, shortness of breath, palpitations   Physical Exam    04/25/2022    3:22 PM 12/19/2020   11:00 AM 11/21/2020   10:59 AM  Vitals with BMI  Height '5\' 2"'$  '5\' 1"'$  '5\' 1"'$   Weight 202 lbs 10 oz 217 lbs 216 lbs  BMI 37.05 19.41 74.08  Systolic 144 818 563  Diastolic 86 78 71  Pulse 72 60 63    General:  No acute distress,  Alert and oriented, Non-Toxic, Normal speech and affect Breast: No easily palpable breast masses on physical exam, significant breast ptosis and macromastia. Her breasts are extremely large and fairly symmetric with the left larger.  She has hyperpigmentation of the inframammary area on both sides.  The sternal to nipple distance on the right is 33 cm and the left is 36 cm.  The IMF distance is 10 cm on the right and 10 cm on the left.  Base width is 17 on the right and 18 on the left. Assessment/Plan The patient is a good candidate for abdominal panniculectomy.  She would get additional cosmetic benefit from abdominoplasty.`  The patient has bilateral symptomatic macromastia.  She is a good candidate for a breast reduction.  She is interested in pursuing surgical treatment.  She has tried supportive garments and fitted bras with no relief.  The details of breast reduction surgery were discussed.  I explained the procedure in detail along the with the expected scars.  The risks were discussed in detail and include bleeding, infection, damage to surrounding structures, need for additional procedures, nipple loss, change in nipple sensation, persistent pain, contour irregularities and asymmetries.  I explained that breast feeding is often not possible after breast reduction surgery.  We discussed the expected postoperative course with an overall recovery period of about 1 month.  She demonstrated full understanding of all risks.  We discussed her personal risk factors that include high bmi.  The  patient is interested in pursuing surgical treatment.  The estimated excess breast tissue to be removed at the time of surgery = 400 grams on the left and 400 grams on the right. Lennice Sites 04/26/2022, 4:00 PM

## 2022-05-07 ENCOUNTER — Encounter (INDEPENDENT_AMBULATORY_CARE_PROVIDER_SITE_OTHER): Payer: Self-pay

## 2022-05-12 ENCOUNTER — Encounter: Payer: Self-pay | Admitting: *Deleted

## 2023-06-24 ENCOUNTER — Other Ambulatory Visit: Payer: Self-pay | Admitting: Internal Medicine

## 2023-06-24 DIAGNOSIS — Z87898 Personal history of other specified conditions: Secondary | ICD-10-CM

## 2023-12-14 ENCOUNTER — Other Ambulatory Visit: Payer: Self-pay | Admitting: Family Medicine

## 2023-12-14 DIAGNOSIS — R600 Localized edema: Secondary | ICD-10-CM

## 2023-12-14 NOTE — Progress Notes (Signed)
 LLE edema x 5 days. Constant w/ waxing and waning nature secondary to time in the sitting or upright position. No CP, SOB, palpitaitons   Venous Duplex ordered

## 2023-12-17 ENCOUNTER — Ambulatory Visit (HOSPITAL_COMMUNITY): Admission: RE | Admit: 2023-12-17 | Source: Ambulatory Visit

## 2023-12-31 ENCOUNTER — Ambulatory Visit (HOSPITAL_BASED_OUTPATIENT_CLINIC_OR_DEPARTMENT_OTHER)
Admission: RE | Admit: 2023-12-31 | Discharge: 2023-12-31 | Disposition: A | Discharge status: Home / Self Care | Source: Ambulatory Visit | Attending: Family Medicine | Admitting: Family Medicine

## 2023-12-31 DIAGNOSIS — R600 Localized edema: Secondary | ICD-10-CM | POA: Diagnosis present

## 2024-01-04 ENCOUNTER — Other Ambulatory Visit: Payer: Self-pay | Admitting: Obstetrics and Gynecology

## 2024-01-04 DIAGNOSIS — Z1231 Encounter for screening mammogram for malignant neoplasm of breast: Secondary | ICD-10-CM

## 2024-03-08 ENCOUNTER — Ambulatory Visit
Admission: RE | Admit: 2024-03-08 | Discharge: 2024-03-08 | Disposition: A | Discharge status: Home / Self Care | Source: Ambulatory Visit | Attending: Obstetrics and Gynecology | Admitting: Obstetrics and Gynecology

## 2024-03-08 DIAGNOSIS — Z1231 Encounter for screening mammogram for malignant neoplasm of breast: Secondary | ICD-10-CM
